# Patient Record
Sex: Female | Born: 1960
Health system: Southern US, Community
[De-identification: ages and names within clinical notes are randomized; demographics above are authoritative.]

## PROBLEM LIST (undated history)

## (undated) DIAGNOSIS — E785 Hyperlipidemia, unspecified: Secondary | ICD-10-CM

## (undated) DIAGNOSIS — R002 Palpitations: Secondary | ICD-10-CM

## (undated) DIAGNOSIS — K635 Polyp of colon: Secondary | ICD-10-CM

## (undated) DIAGNOSIS — M1990 Unspecified osteoarthritis, unspecified site: Secondary | ICD-10-CM

## (undated) DIAGNOSIS — I10 Essential (primary) hypertension: Secondary | ICD-10-CM

## (undated) DIAGNOSIS — E669 Obesity, unspecified: Secondary | ICD-10-CM

## (undated) HISTORY — PX: COLONOSCOPY: SHX174

## (undated) HISTORY — DX: Essential (primary) hypertension: I10

## (undated) HISTORY — DX: Hyperlipidemia, unspecified: E78.5

## (undated) HISTORY — DX: Palpitations: R00.2

## (undated) HISTORY — DX: Polyp of colon: K63.5

## (undated) HISTORY — PX: POLYPECTOMY: SHX149

## (undated) HISTORY — DX: Obesity, unspecified: E66.9

## (undated) HISTORY — PX: WISDOM TOOTH EXTRACTION: SHX21

## (undated) HISTORY — DX: Unspecified osteoarthritis, unspecified site: M19.90

---

## 1988-10-27 HISTORY — PX: TUBAL LIGATION: SHX77

## 2001-10-27 HISTORY — PX: BREAST BIOPSY: SHX20

## 2004-10-27 HISTORY — PX: VAGINAL HYSTERECTOMY: SUR661

## 2006-10-27 DIAGNOSIS — K635 Polyp of colon: Secondary | ICD-10-CM

## 2006-10-27 HISTORY — DX: Polyp of colon: K63.5

## 2010-10-27 DIAGNOSIS — I10 Essential (primary) hypertension: Secondary | ICD-10-CM

## 2010-10-27 DIAGNOSIS — E785 Hyperlipidemia, unspecified: Secondary | ICD-10-CM

## 2010-10-27 HISTORY — DX: Essential (primary) hypertension: I10

## 2010-10-27 HISTORY — DX: Hyperlipidemia, unspecified: E78.5

## 2011-05-09 ENCOUNTER — Ambulatory Visit: Payer: Self-pay | Admitting: Family Medicine

## 2011-06-17 ENCOUNTER — Encounter: Payer: Self-pay | Admitting: Family Medicine

## 2011-06-17 ENCOUNTER — Ambulatory Visit (INDEPENDENT_AMBULATORY_CARE_PROVIDER_SITE_OTHER): Payer: PRIVATE HEALTH INSURANCE | Admitting: Family Medicine

## 2011-06-17 VITALS — BP 130/86 | HR 75 | Temp 98.7°F | Resp 20 | Ht 64.0 in | Wt 240.0 lb

## 2011-06-17 DIAGNOSIS — Z0000 Encounter for general adult medical examination without abnormal findings: Secondary | ICD-10-CM

## 2011-06-17 DIAGNOSIS — D126 Benign neoplasm of colon, unspecified: Secondary | ICD-10-CM

## 2011-06-17 DIAGNOSIS — K625 Hemorrhage of anus and rectum: Secondary | ICD-10-CM

## 2011-06-17 DIAGNOSIS — E119 Type 2 diabetes mellitus without complications: Secondary | ICD-10-CM

## 2011-06-17 DIAGNOSIS — K635 Polyp of colon: Secondary | ICD-10-CM

## 2011-06-17 DIAGNOSIS — I10 Essential (primary) hypertension: Secondary | ICD-10-CM | POA: Insufficient documentation

## 2011-06-17 LAB — POCT URINALYSIS DIPSTICK
Glucose, UA: NEGATIVE
Leukocytes, UA: NEGATIVE
Nitrite, UA: NEGATIVE
Protein, UA: NEGATIVE
Urobilinogen, UA: 0.2

## 2011-06-17 LAB — BASIC METABOLIC PANEL
Chloride: 104 mEq/L (ref 96–112)
Creatinine, Ser: 0.5 mg/dL (ref 0.4–1.2)
GFR: 139.53 mL/min (ref >60.00)
Potassium: 3.6 mEq/L (ref 3.5–5.1)

## 2011-06-17 LAB — LIPID PANEL
Cholesterol: 186 mg/dL (ref 0–200)
HDL: 52.7 mg/dL (ref >39.00)
LDL Cholesterol: 120 mg/dL — ABNORMAL HIGH (ref 0–99)
VLDL: 13.6 mg/dL (ref 0.0–40.0)

## 2011-06-17 LAB — MICROALBUMIN / CREATININE URINE RATIO
Creatinine,U: 213.9 mg/dL
Microalb Creat Ratio: 0.3 mg/g (ref 0.0–30.0)

## 2011-06-17 LAB — CBC WITH DIFFERENTIAL/PLATELET
Basophils Absolute: 0 10*3/uL (ref 0.0–0.1)
Basophils Relative: 0.8 % (ref 0.0–3.0)
Eosinophils Absolute: 0.1 10*3/uL (ref 0.0–0.7)
Lymphocytes Relative: 43.5 % (ref 12.0–46.0)
MCHC: 32.1 g/dL (ref 30.0–36.0)
Monocytes Absolute: 0.4 10*3/uL (ref 0.1–1.0)
Neutrophils Relative %: 46.1 % (ref 43.0–77.0)
Platelets: 215 10*3/uL (ref 150.0–400.0)
RBC: 4.66 Mil/uL (ref 3.87–5.11)
RDW: 14.4 % (ref 11.5–14.6)

## 2011-06-17 LAB — TSH: TSH: 0.58 u[IU]/mL (ref 0.35–5.50)

## 2011-06-17 LAB — HEPATIC FUNCTION PANEL
AST: 17 U/L (ref 0–37)
Total Bilirubin: 0.4 mg/dL (ref 0.3–1.2)

## 2011-06-17 LAB — HEMOGLOBIN A1C: Hgb A1c MFr Bld: 8 % — ABNORMAL HIGH (ref 4.6–6.5)

## 2011-06-17 MED ORDER — BENAZEPRIL-HYDROCHLOROTHIAZIDE 10-12.5 MG PO TABS: 1.0000 | ORAL_TABLET | Freq: Every day | ORAL | Status: DC

## 2011-06-17 NOTE — Patient Instructions (Signed)
Rectal Bleeding Rectal bleeding is a sign that something is wrong. It is usually something minor that can be easily diagnosed (find the cause), but not always. It is important that the cause of rectal bleeding be identified so treatment can be started and the problem corrected. Rectal bleeding may not be serious, but you should not assume. Rectal bleeding can be a sign of cancer. CAUSES OF RECTAL BLEEDING   Hemorrhoids are dilated (enlarged) blood vessels or veins in the anal or rectal area. They can occur on the outside of the rectal area. You may feel them as small bumps when wiping. They may also be on the inside. Hemorrhoids on the inside of the rectal area are usually painless. Hemorrhoids often develop with chronic (longstanding) constipation, and during pregnancy. Hemorrhoids are usually treated with stool bulking agents. These soften the stool and reduce straining.   Fistulas are abnormal, burrowing channels that usually run from inside the rectum to the skin around the anus. They may drain a whitish discharge, but can also bleed. While it is usually a local problem, a fistula can be associated with chronic inflammation (soreness) in other parts of the intestinal tract (gut). An example of this is Crohn's disease. Fistulas are treated with antibiotics (medications that kill germs). They are also treated with hot baths. If they persist, surgery may be required.   A Fissure may occur with the passage of a hard stool or with severe diarrhea. The tissue lining the anus is torn. Nerve endings and blood vessels are exposed. Pain and bleeding occur with bowel movements. Frequent warm baths and bulking agents, used to keep stools soft, usually correct this problem. Sometimes surgery is needed.   Diverticulosis is a condition in which pockets, or sacs, project from the bowel wall. The sacs balloon out over the years due to recurrent, high-pressure spasms of the colon. Occasionally they can bleed.  They usually produce a lot of blood. The blood comes all at one time. The blood normally does not continue in small amounts with bowel movements over days or weeks. Serious, persistent diverticular bleeding usually requires hospitalization. It may also require surgery.   Proctitis and Colitis are conditions in which the rectum, colon, or both, can become inflamed (red and sore) and ulcerated (pitted). There are a number of disorders that cause the inside surface of the bowel to become ulcerated and bleed. There may be rectal urgency, cramps, or diarrhea associated with the bleeding. When the inflammation is restricted to the rectum, the condition is called proctitis. When the colon is involved, it is called colitis. It is important to identify the specific cause of the inflammation, so you can start treatment.   Polyps and Cancer. Polyps are benign (non-cancerous) growths in the colon that may bleed. Certain types of polyps turn into cancer. Colon cancer is often curable when discovered early. It most often occurs in people over the age of 78, but may occur in younger individuals. It can occur in those in their 39's or younger. Because colon cancer is such a common cancer, it is always considered a possible cause of bleeding.   Protrusion of the Rectum. Part of the rectum can project from the anus and bleed. This happens as older individuals develop weakened rectal support tissues. This condition is called rectal prolapse. It can be felt as an abnormal bulging from the rectum when wiping. Surgery is the only effective treatment.  DIAGNOSING RECTAL BLEEDING  The Medical History.  Age is  important. Older people tend to develop polyps and cancer more often. If there is anal pain and a hard, large stool associated with bleeding, a tear of the anus may be the cause. If blood drips into the toilet after a bowel movement, bleeding hemorrhoids may be the problem. The color and frequency of the bleeding are  additional considerations. In most cases, the medical history provides clues, but seldom the final answer.   The Visual and Digital Exam.  The caregiver will inspect the anal area to look for tears and hemorrhoids. A finger exam can provide information when there is tenderness or a tumor inside. In men, the prostate is also examined.   Endoscopy. There are several types of endoscopes (small, long viewing scopes) used to view the colon. In the office, the caregiver may use a rigid or, more commonly, a flexible viewing sigmoidoscope. This exam is called flexible sigmoidoscopy. It is performed in 5 to 10 minutes. A more thorough exam is accomplished with a colonoscope. This allows the caregiver to view the entire 5 to 6 foot long colon. Sedation (to make the patient sleep) is usually given for this exam. Frequently, a bleeding lesion may be beyond the reach of the sigmoidoscope. In these cases, a colonoscopy may be the best initial exam. Both exams are usually done on an outpatient basis. This means the patient does not stay overnight in the hospital or surgery center.   Barium Enema X-ray.  This is an x-ray exam that uses liquid barium inserted by enema into the rectum. X-rays highlight abnormal shadows, such as tumors (lumps), diverticuli and colitis. By itself, however, it may not identify an actual bleeding point.  Rectal bleeding always means that there is a problem. It is usually not a serious problem, but it should always be evaluated to make sure. The diagnosis is made easily, and effective treatment is almost always available. SEEK IMMEDIATE MEDICAL ATTENTION IF:  Bleeding is ongoing or severe.   You feel dizzy or faint.   You develop severe abdominal pain or tenderness.   An oral temperature above 102 F (38.9 C) develops, or as your caregiver suggests.  Document Released: 04/04/2002 Document Re-Released: 07/22/2008 [facility] Patient Information 2011 [place], [place].  Diabetes Meal  Planning Guide The diabetes meal planning guide is a tool to help you plan your meals and snacks. It is important for people with diabetes to manage their blood sugar levels. Choosing the right foods and the right amounts throughout your day will help control your blood sugar. Eating right can even help you improve your blood pressure and reach or maintain a healthy weight. CARBOHYDRATE COUNTING MADE EASY When you eat carbohydrates, they turn to sugar (glucose). This raises your blood sugar level. Counting carbohydrates can help you control this level so you feel better. When you plan your meals by counting carbohydrates, you can have more flexibility in what you eat and balance your medicine with your food intake. Carbohydrate counting simply means adding up the total amount of carbohydrate grams (g) in your meals or snacks. Try to eat about the same amount at each meal. Foods with carbohydrates are listed below. Each portion below is 1 carbohydrate serving or 15 grams of carbohydrates. Ask your dietician how many grams of carbohydrates you should eat at each meal or snack. Grains and Starches 1 slice bread 1/2 English muffin or hotdog/hamburger bun 3/4 cup cold cereal (unsweetened) 1/3 cup cooked pasta or rice 1/2 cup starchy vegetables (corn, potatoes, peas, beans, winter  squash) 1 tortilla (6 inches) 1/4 bagel 1 waffle or pancake (size of a CD) 1/2 cup cooked cereal 4 to 6 small crackers *Whole grain is recommended Fruit 1 cup fresh unsweetened berries, melon, papaya, pineapple 1 small fresh fruit 1/2 banana or mango 1/2 cup fruit juice (4 ounces unsweetened) 1/2 cup canned fruit in natural juice or water 2 tablespoons dried fruit 12 to 15 grapes or cherries Milk and Yogurt 1 cup fat-free or 1% milk 1 cup soy milk 6 ounces light yogurt with sugar-free sweetener 6 ounces low-fat soy yogurt 6 ounces plain yogurt Vegetables 1 cup raw or 1/2 cup cooked is counted as 0 carbohydrates or  a "free" food. If you eat 3 or more servings at one meal, count them as 1 carbohydrate serving. Other Carbohydrates 3/4 ounces chips or pretzels 1/2 cup ice cream or frozen yogurt 1/4 cup sherbet or sorbet 2 inch square cake, no frosting 1 tablespoon honey, sugar, jam, jelly, or syrup 2 small cookies 3 squares of graham crackers 3 cups popcorn 6 crackers 1 cup broth-based soup Count 1 cup casserole or other mixed foods as 2 carbohydrate servings. Foods with less than 20 calories in a serving may be counted as 0 carbohydrates or a "free" food. You may want to purchase a book or computer software that lists the carbohydrate gram counts of different foods. In addition, the nutrition facts panel on the labels of the foods you eat are a good source of this information. The label will tell you how big the serving size is and the total number of carbohydrate grams you will be eating per serving. Divide this number by 15 to obtain the number of carbohydrate servings in a portion. Remember: 1 carbohydrate serving equals 15 grams of carbohydrate. SERVING SIZES Measuring foods and serving sizes helps you make sure you are getting the right amount of food. The list below tells how big or small some common serving sizes are.  1 ounce (oz) of cheese.................................4 stacked dice.   2 to 3 oz cooked meat.................................Marland KitchenDeck of cards.   1 teaspoon (tsp)...........................................Marland KitchenTip of little finger.   1 tablespoon (tbs).......................................Marland KitchenMarland KitchenThumb.   2 tbs............................................................Marland KitchenGolf ball.    cup..........................................................Marland KitchenHalf of a fist.   1 cup...........................................................Marland KitchenA fist.  SAMPLE DIABETES MEAL PLAN Below is a sample meal plan that includes foods from the grain and starches, dairy, vegetable, fruit, and meat groups. A  dietician can individualize a meal plan to fit your calorie needs and tell you the number of servings needed from each food group. However, controlling the total amount of carbohydrates in your meal or snack is more important than making sure you include all of the food groups at every meal. You may interchange carbohydrate containing foods (dairy, starches, and fruits). The meal plan below is an example of a 2000 calorie diet using carbohydrate counting. This meal plan has 17 carbohydrate servings (carb choices). Breakfast 1 cup oatmeal (2 carb choices) 3/4 cup light yogurt (1 carb choice) 1 cup blueberries (1 carb choice) 1/4 cup almonds  Snack 1 large apple (2 carb choices) 1 low-fat string cheese stick  Lunch Chicken breast salad:  1 cup spinach   1/4 cup chopped tomatoes   2 oz chicken breast, sliced   2 tbs low-fat [place] dressing  12 whole-wheat crackers (2 carb choices) 12 to 15 grapes (1 carb choice) 1 cup low-fat milk (1 carb choice)  Snack 1 cup carrots 1/2 cup hummus (1 carb choice)  Dinner 3 oz broiled salmon 1 cup  brown rice (3 carb choices)  Snack 1 1/2 cups steamed broccoli (1 carb choice) drizzled with 1 tsp olive oil and lemon juice 1 cup light pudding (2 carb choices)  DIABETES MEAL PLANNING WORKSHEET Your dietician can use this worksheet to help you decide how many servings of foods and what types of foods are right for you.  Breakfast Food Group and Servings Carb Choices Grain/Starches _______________________________________ Dairy ______________________________________________ Vegetable _______________________________________ Fruit _______________________________________________ Meat _______________________________________________ Fat _____________________________________________ Lunch Food Group and Servings Carb Choices Grain/Starches ________________________________________ Dairy _______________________________________________ Fruit  ________________________________________________ Meat ________________________________________________ Fat _____________________________________________ Dinner Food Group and Servings Carb Choices Grain/Starches ________________________________________ Dairy _______________________________________________ Fruit ________________________________________________ Meat ________________________________________________ Fat _____________________________________________ Snacks Food Group and Servings Carb Choices Grain/Starches ________________________________________ Dairy _______________________________________________ Vegetable ________________________________________ Fruit ________________________________________________ Meat ________________________________________________ Fat _____________________________________________ Daily Totals Starches _________________________ Vegetable __________________________ Fruit ______________________________ Dairy ______________________________ Meat ______________________________ Fat ________________________________  Document Released: 07/10/2005 Document Re-Released: 04/02/2010 [employer] Patient Information 2011 [place], [place].

## 2011-06-17 NOTE — Progress Notes (Signed)
  Subjective:    Patient ID: Angela Barr, female    DOB: 08/15/1961, 50 y.o.   MRN: [id number]  HPI Pt here to establish and c/o bleeding per rectum x 2 weeks.  Pt has hx of hemorrhoids and colon polyps and was supposed to have a colonoscopy last year but did not.  She just moved here from [place].  Pt states her stools are not hard and she does not strain.  Pt has also not been taking any of her meds  But states her BS have been running low---in 70s.  Review of Systems as above   Objective:   Physical Exam  Constitutional: She is oriented to person, place, and time. She appears well-developed and well-nourished.  Cardiovascular: Normal rate, regular rhythm and normal heart sounds.   No murmur heard. Pulmonary/Chest: Effort normal and breath sounds normal. No respiratory distress. She has no wheezes. She has no rales. She exhibits no tenderness.  Genitourinary: Rectal exam shows external hemorrhoid. Rectal exam shows no fissure, no mass, no tenderness and anal tone normal. Guaiac positive stool.  Musculoskeletal: She exhibits no edema and no tenderness.  Neurological: She is alert and oriented to person, place, and time.  Skin: Skin is warm and dry.  Psychiatric: She has a normal mood and affect. Her behavior is normal.          Assessment & Plan:  1. + rectal bleed with hx colon polyps and hemorrhoids      Pt is already eating more fiber       Refer to GI          Check labs today 2 hx diabetes---check labs today 3. HTN  meds refilled rto for cpe or sooner prn

## 2011-06-18 ENCOUNTER — Encounter: Payer: Self-pay | Admitting: Gastroenterology

## 2011-06-24 ENCOUNTER — Telehealth: Payer: Self-pay

## 2011-06-24 MED ORDER — ATORVASTATIN CALCIUM 20 MG PO TABS: 20.0000 mg | ORAL_TABLET | Freq: Every day | ORAL | Status: DC

## 2011-06-24 MED ORDER — METFORMIN HCL ER 500 MG PO TB24: 500.0000 mg | ORAL_TABLET | Freq: Every day | ORAL | Status: DC

## 2011-06-24 NOTE — Telephone Encounter (Signed)
Notes Recorded by Almeta Monas, CMA on 06/24/2011 at 3:10 PM Discuss with patient and she stated she had not been taking the metformin as directed, she stopped it 4 mos agao. She stated she will start it back with 1 instead of doing 2 daily. I discussed with Dr.Lowne and she stated that was ok. Rx sent to [facility] KP Notes Recorded by Almeta Monas, CMA on 06/24/2011 at 2:35 PM mssg left to call the office KP Notes Recorded by Lelon Perla, DO on 06/22/2011 at 3:26 PM Cholesterol--- LDL goal < 70, HDL >40, TG < 150. Diet and exercise will increase HDL and decrease LDL and TG. Fish, Fish Oil, Flaxseed oil will also help increase the HDL and decrease Triglycerides. Start lipitor 20 mg #30 1 po qhs, 2 refills.   hgba1c is elevated---- increase Glucophage xr 500 mg 2 po qd #60 2 refills Recheck labs in 3 months. 250.00 272.4 Lipid, hep, bmp, hgba1c, microalbumin

## 2011-07-04 ENCOUNTER — Ambulatory Visit (INDEPENDENT_AMBULATORY_CARE_PROVIDER_SITE_OTHER): Payer: PRIVATE HEALTH INSURANCE | Admitting: Gastroenterology

## 2011-07-04 ENCOUNTER — Encounter: Payer: Self-pay | Admitting: Gastroenterology

## 2011-07-04 DIAGNOSIS — K625 Hemorrhage of anus and rectum: Secondary | ICD-10-CM | POA: Insufficient documentation

## 2011-07-04 DIAGNOSIS — Z860100 Personal history of colon polyps, unspecified: Secondary | ICD-10-CM | POA: Insufficient documentation

## 2011-07-04 DIAGNOSIS — Z800 Family history of malignant neoplasm of digestive organs: Secondary | ICD-10-CM | POA: Insufficient documentation

## 2011-07-04 MED ORDER — HYDROCORTISONE ACETATE 25 MG RE SUPP: 25.0000 mg | Freq: Two times a day (BID) | RECTAL | Status: AC

## 2011-07-04 NOTE — Assessment & Plan Note (Signed)
Limited rectal bleeding is most likely related to hemorrhoids.  Recommendations #1 Anusol-HC suppositories

## 2011-07-04 NOTE — Patient Instructions (Signed)
Colonoscopy A colonoscopy is an exam to evaluate your entire colon. In this exam, your colon is cleansed. A long fiberoptic tube is inserted through your rectum and into your colon. The fiberoptic scope (endoscope) is a long bundle of enclosed and very flexible fibers. These fibers transmit light to the area examined and send images from that area to your caregiver. Discomfort is usually minimal. You may be given a drug to help you sleep (sedative) during or prior to the procedure. This exam helps to detect lumps (tumors), polyps, inflammation, and areas of bleeding. Your caregiver may also take a small piece of tissue (biopsy) that will be examined under a microscope. BEFORE THE PROCEDURE  A clear liquid diet may be required for 2 days before the exam.   Liquid injections (enemas) or laxatives may be required.   A large amount of electrolyte solution may be given to you to drink over a short period of time. This solution is used to clean out your colon.   You should be present 1 prior to your procedure or as directed by your caregiver.   Check in at the admissions desk to fill out necessary forms if not preregistered. There will be consent forms to sign prior to the procedure. If accompanied by friends or family, there is a waiting area for them while you are having your procedure.  LET YOUR CAREGIVER KNOW ABOUT:  Allergies to food or medicine.  Medicines taken, including vitamins, herbs, eyedrops, over-the-counter medicines, and creams.   Use of steroids (by mouth or creams).   Previous problems with anesthetics or numbing medicines.   History of bleeding problems or blood clots.  Previous surgery.   Other health problems, including diabetes and kidney problems.   Possibility of pregnancy, if this applies.   AFTER THE PROCEDURE  If you received a sedative and/or pain medicine, you will need to arrange for someone to drive you home.   Occasionally, there is a little blood passed  with the first bowel movement. DO NOT be concerned.  HOME CARE INSTRUCTIONS  It is not unusual to pass moderate amounts of gas and experience mild abdominal cramping following the procedure. This is due to air being used to inflate your colon during the exam. Walking or a warm pack on your belly (abdomen) may help.   You may resume all normal meals and activities after sedatives and medicines have worn off.   Only take over-the-counter or prescription medicines for pain, discomfort, or fever as directed by your caregiver. DO NOT use aspirin or blood thinners if a biopsy was taken. Consult your caregiver for medicine usage if biopsies were taken.  FINDING OUT THE RESULTS OF YOUR TEST Not all test results are available during your visit. If your test results are not back during the visit, make an appointment with your caregiver to find out the results. Do not assume everything is normal if you have not heard from your caregiver or the medical facility. It is important for you to follow up on all of your test results. SEEK IMMEDIATE MEDICAL CARE IF:  You pass large blood clots or fill a toilet with blood following the procedure. This may also occur 10 to 14 days following the procedure. This is more likely if a biopsy was taken.   You develop abdominal pain that keeps getting worse and cannot be relieved with medicine.  Document Released: 10/10/2000 Document Re-Released: 01/07/2010 [employer] Patient Information 2011 [employer]. 

## 2011-07-04 NOTE — Assessment & Plan Note (Signed)
Plan followup colonoscopy  Risks, alternatives, and complications of the procedure, including bleeding, perforation, and possible need for surgery, were explained to the patient.  Patient's questions were answered.  

## 2011-07-04 NOTE — Assessment & Plan Note (Signed)
Plan colonoscopy every 5 years 

## 2011-07-04 NOTE — Progress Notes (Signed)
History of Present Illness: Angela Barr is a pleasant 50 year old white female referred at the request of Dr. Laury Axon  for evaluation of rectal bleeding.  Over the past month she's been experiencing small amounts of rectal bleeding consisting of bright red blood on  the tissue after a bowel movement. She's had some difficulty cleaning herself  and rectal burning and itching. Family history is pertinent for a father with colon cancer in his early 68s. Multiple colon polyps apparently were removed in 2008.  Recent hemoglobin 12.4 and MCV 83.1.  Past medical history is pertinent for non-insulin-dependent diabetes mellitus.    Review of Systems: Pertinent positive and negative review of systems were noted in the above HPI section. All other review of systems were otherwise negative.    Current Medications, Allergies, Past Medical History, Past Surgical History, Family History and Social History were reviewed in [facility] electronic medical record  Vital signs were reviewed in today's medical record. Physical Exam: General: Well developed , well nourished, no acute distress Head: Normocephalic and atraumatic Eyes:  sclerae anicteric, EOMI Ears: Normal auditory acuity Mouth: No deformity or lesions Lungs: Clear throughout to auscultation Heart: Regular rate and rhythm; no murmurs, rubs or bruits Abdomen: Soft, non tender and non distended. No masses, hepatosplenomegaly or hernias noted. Normal Bowel sounds Rectal: Small external hemorrhoids are present Musculoskeletal: Symmetrical with no gross deformities  Pulses:  Normal pulses noted Extremities: No clubbing, cyanosis, edema or deformities noted Neurological: Alert oriented x 4, grossly nonfocal Psychological:  Alert and cooperative. Normal mood and affect

## 2011-07-07 ENCOUNTER — Encounter: Payer: Self-pay | Admitting: Gastroenterology

## 2011-07-07 ENCOUNTER — Ambulatory Visit (AMBULATORY_SURGERY_CENTER): Payer: PRIVATE HEALTH INSURANCE | Admitting: Gastroenterology

## 2011-07-07 DIAGNOSIS — K625 Hemorrhage of anus and rectum: Secondary | ICD-10-CM

## 2011-07-07 DIAGNOSIS — Z86010 Personal history of colonic polyps: Secondary | ICD-10-CM

## 2011-07-07 DIAGNOSIS — Z1211 Encounter for screening for malignant neoplasm of colon: Secondary | ICD-10-CM

## 2011-07-07 DIAGNOSIS — D126 Benign neoplasm of colon, unspecified: Secondary | ICD-10-CM

## 2011-07-07 DIAGNOSIS — Z800 Family history of malignant neoplasm of digestive organs: Secondary | ICD-10-CM

## 2011-07-07 HISTORY — PX: COLONOSCOPY: SHX174

## 2011-07-07 LAB — GLUCOSE, CAPILLARY

## 2011-07-07 MED ORDER — SODIUM CHLORIDE 0.9 % IV SOLN: 500.0000 mL | INTRAVENOUS | Status: DC

## 2011-07-07 NOTE — Progress Notes (Signed)
No complaints in the recovery room or on discharge. MAW

## 2011-07-07 NOTE — Patient Instructions (Signed)
See the picture page for your findings from your exam today.  Follow the green and blue discharge instruction sheets the rest of the day.  Resume your prior medications today. Please call if any questions or concerns.  

## 2011-07-08 ENCOUNTER — Telehealth: Payer: Self-pay | Admitting: *Deleted

## 2011-07-08 NOTE — Telephone Encounter (Signed)
Left message on cell

## 2011-11-13 ENCOUNTER — Encounter: Payer: Self-pay | Admitting: Family Medicine

## 2011-11-13 ENCOUNTER — Ambulatory Visit (INDEPENDENT_AMBULATORY_CARE_PROVIDER_SITE_OTHER): Payer: PRIVATE HEALTH INSURANCE | Admitting: Family Medicine

## 2011-11-13 VITALS — BP 132/76 | HR 94 | Temp 98.1°F | Wt 242.0 lb

## 2011-11-13 DIAGNOSIS — N800 Endometriosis of uterus: Secondary | ICD-10-CM

## 2011-11-13 DIAGNOSIS — IMO0002 Reserved for concepts with insufficient information to code with codable children: Secondary | ICD-10-CM

## 2011-11-13 DIAGNOSIS — S76219A Strain of adductor muscle, fascia and tendon of unspecified thigh, initial encounter: Secondary | ICD-10-CM

## 2011-11-13 DIAGNOSIS — N8111 Cystocele, midline: Secondary | ICD-10-CM

## 2011-11-13 MED ORDER — TRAMADOL HCL 50 MG PO TABS: 50.0000 mg | ORAL_TABLET | Freq: Three times a day (TID) | ORAL | Status: AC | PRN

## 2011-11-13 NOTE — Patient Instructions (Signed)
Cystocele Repair A cystocele is a bulging, drooping hernia or break (rupture) of bladder tissue into the birth canal (vagina). This bulging or rupture occurs on the top front wall of the vagina. CAUSES  Cystocele is associated with weakness of the top front wall of the vagina due to stretching and tearing of the ligaments and muscles in the area. This is often the result of:  Multiple childbirths.   Continuous heavy lifting.   Chronic cough from asthma, emphysema, or smoking.   Being overweight.   Changes from aging.   Previous surgery in the vaginal area.   Menopause with loss of estrogen hormone and weakening of the ligaments and muscles around the bladder.  SYMPTOMS   Uncontrolled loss of urine (incontinence) with cough, sneeze, or exercise.   Pelvic pressure.   Frequency or urgency to urinate because of inability to completely empty the bladder.   Bladder infections.   Needing to push on the upper vagina to help yourself pass urine.  DIAGNOSIS  A cystocele can be diagnosed by doing a pelvic exam and observing the top of the vagina drooping or bulging into or out of the vagina. TREATMENT  Surgical options:  Cystocele repair is surgery that removes the hernia.   There are also different "sling" operations that may be used.  Discuss the different types of surgeries to repair a cystocele with your caregiver. Your caregiver will decide what type of surgery will be best in your case. Nonsurgical options:  Kegel exercises. This helps strengthen and tighten the muscles and tissue in and around the bladder and vagina. This may help with mild cases of cystocele.   A pessary may help the cystocele. A pessary is a plastic or rubber device that lifts the bladder into place. A pessary must be fitted by a doctor.   Tampons or diaphragms that lift the bladder into place are sometimes helpful with a minor or small cystocele.   Estrogen may help with mild cases in menopausal and aging  women.  LET YOUR CAREGIVER KNOW ABOUT:   Allergies to food or medicine.   Medicines taken, including vitamins, herbs, eyedrops, over-the-counter medicines, and creams.   Use of steroids (by mouth or creams).   Previous problems with anesthetics or numbing medicines.   History of bleeding problems or blood clots.   Previous surgery.   Other health problems, including diabetes and kidney problems.   Possibility of pregnancy, if this applies.  RISKS AND COMPLICATIONS  All surgery is associated with risks.  There are risks with a general anesthesia. You should discuss this with your caregiver.   With spinal or epidural anesthesia, there may be an area that is not numbed, and you could feel pain.   Headache could occur with a spinal or epidural anesthetic.   The catheter you will have after surgery may not work properly or may get blocked and need to be replaced.   Excessive bleeding.   Infection.   Injury to surrounding structures.   Recurrence of the cystocele.   Surgery may not get rid of your symptoms.  BEFORE THE PROCEDURE   Do not take aspirin or blood thinners for 1 week prior to surgery, unless instructed otherwise.   Do not eat or drink anything after midnight the night before surgery.   Let your caregiver know if you develop a cold or other infectious problems prior to surgery.   If being admitted the day of surgery, you should be present 1 hour prior to your   procedure or as directed by your caregiver.   Plan and arrange for help when you go home from the hospital.   If you smoke, do not smoke for at least 2 weeks before the surgery.   Do not drink any alcohol for 3 days before the surgery.  PROCEDURE  You will be given an anesthetic to prevent you from feeling pain during surgery. This may be a general anesthetic that puts you to sleep, or a spinal or epidural anesthetic. You will be asleep or be numbed through the entire procedure. During cystocele repair,  tissue is pulled from the sides and around the top of the vagina to lift up the hernia. This removes the hernia so that the top of the vagina does not fall into the opening of the vagina. AFTER THE PROCEDURE  After surgery, you will be taken to the recovery room where a nurse will take care of you, checking your breathing, blood pressure, pulse, and your progress. When your caregiver feels you are stable, you will be taken to your room. You will have a drainage tube (Foley catheter) that will drain your bladder for 2 to 7 days or longer, until your bladder is working properly. This catheter is placed prior to surgery to help keep your bladder empty and out of the way during the procedure. After surgery, this will make passing your urine easier. The catheter will be removed when you can easily pass urine without this assistance. You may have gauze packing in the vagina that will be removed 1 to 2 days after the surgery. Usually, you will be given a medicine (antibiotic) that kills germs. You will be given pain medicine as needed. You can usually go home in 3 to 5 days. HOME CARE INSTRUCTIONS   Do not take baths. Take showers until your caregiver informs you otherwise.   Take antibiotics as directed by your caregiver.   Exercise as instructed. Do not perform exercises which increase the pressure inside your belly (abdomen), such as sit-ups or lifting weights, until your caregiver has given permission. Walking exercise is preferred.   Only take over-the-counter or prescription medicines for pain and discomfort as directed by your caregiver.   Do not drink alcohol while taking pain medicine.   Do not lift anything over 5 pounds.   Do not drive until your caregiver gives you permission.   Get plenty of rest and sleep.   Have someone help with your household chores for 1 to 2 weeks.   If you develop constipation, you may take a mild laxative with your caregiver's permission. Eating bran foods and  drinking enough water and fluids to keep your urine clear or pale yellow helps with constipation.   Do not take aspirin. It may cause bleeding.   You may resume normal diet and unstrenuous activities as directed.   Do not douche, use tampons, or engage in intercourse until your surgeon has given permission.   Change bandages (dressings) as directed.   Make and keep all your postoperative appointments.  SEEK MEDICAL CARE IF:   You have abnormal vaginal discharge.   You develop a rash.   You are having a reaction to your medicine.   You develop nausea or vomiting.  SEEK IMMEDIATE MEDICAL CARE IF:   You have redness, swelling, or increasing pain in the vaginal area.   You notice pus coming from the vagina.   You have a fever.   You notice a bad smell coming from the vagina.     You have increasing abdominal pain.   You have frequent urination or you notice burning during urination.   You notice blood in your urine.   You have excessive vaginal bleeding.   You cannot urinate.  MAKE SURE YOU:   Understand these instructions.   Will watch your condition.   Will get help right away if you are not doing well or get worse.  Document Released: 10/10/2000 Document Revised: 06/25/2011 Document Reviewed: 01/10/2010 [employer] Patient Information 2012 [employer]. 

## 2011-11-13 NOTE — Progress Notes (Signed)
  Subjective:    Patient ID: Angela Barr, female    DOB: [birth year], 51 y.o.   MRN: [id number]  HPI  Pt here c/o R groin pain for 1 1/2 years and she said her gyn in [place] said her bladder had dropped.  Pt with hx fractured foot after fall and pt ended up in a split position after fall--groin hurt since then.    Review of Systems As above    Objective:   Physical Exam  Constitutional: She is oriented to person, place, and time. She appears well-developed and well-nourished.  Genitourinary:       No hernia + tenderness with palp R groin  Neurological: She is alert and oriented to person, place, and time.  Skin: Skin is warm and dry.  Psychiatric: She has a normal mood and affect. Her behavior is normal. Judgment and thought content normal.          Assessment & Plan:  Hx cystocele---  Refer to urology Hx adenomyoisi--s/p tah---refer to gyn here at pt request Groin pain-- ? If increase in pain secondary to cystocele?--- consider referral if no better after seeing urology

## 2011-12-10 ENCOUNTER — Telehealth: Payer: Self-pay | Admitting: Family Medicine

## 2011-12-10 ENCOUNTER — Other Ambulatory Visit: Payer: Self-pay | Admitting: Family Medicine

## 2011-12-10 DIAGNOSIS — I10 Essential (primary) hypertension: Secondary | ICD-10-CM

## 2011-12-10 MED ORDER — BENAZEPRIL-HYDROCHLOROTHIAZIDE 10-12.5 MG PO TABS: 1.0000 | ORAL_TABLET | Freq: Every day | ORAL | Status: DC

## 2011-12-10 NOTE — Telephone Encounter (Signed)
Rx faxed.    KP 

## 2011-12-10 NOTE — Telephone Encounter (Signed)
Patient called the office in regards to her prescription, Benazepril-Hydrochlorothiazide. Although the refill was called in today, the pharmacy has it on hold due to insurance requirements. The insurance will not allow a 30 day rx they want it to be change to 90 day. Patient can be best reached at [phone].

## 2011-12-10 NOTE — Telephone Encounter (Signed)
Rx re-faxed    KP 

## 2012-01-23 ENCOUNTER — Other Ambulatory Visit: Payer: Self-pay | Admitting: Family Medicine

## 2012-01-26 ENCOUNTER — Other Ambulatory Visit: Payer: Self-pay | Admitting: *Deleted

## 2012-01-26 MED ORDER — METFORMIN HCL ER 500 MG PO TB24: 500.0000 mg | ORAL_TABLET | Freq: Every day | ORAL | Status: DC

## 2012-01-26 NOTE — Telephone Encounter (Signed)
Pt called to advise that her metformin has not been called in to [facility] on [address], was advised no more refills, noted 30 tablets of metformin was sent on 01-23-12, pt asked about lab OV and CPE, advised that I could resend the metformin refill into pharmacy per noted [facility] computer malfunctions, transferred pt to scheduler for assistance with setting up lab/CPE OV, pt understood, re-sent metformin via escribe per noted lab visit for A1c on 01-30-12

## 2012-01-30 ENCOUNTER — Other Ambulatory Visit (INDEPENDENT_AMBULATORY_CARE_PROVIDER_SITE_OTHER): Payer: Self-pay

## 2012-01-30 DIAGNOSIS — E119 Type 2 diabetes mellitus without complications: Secondary | ICD-10-CM

## 2012-01-30 DIAGNOSIS — E785 Hyperlipidemia, unspecified: Secondary | ICD-10-CM

## 2012-01-30 LAB — HEPATIC FUNCTION PANEL
AST: 19 U/L (ref 0–37)
Alkaline Phosphatase: 64 U/L (ref 39–117)
Total Bilirubin: 0.4 mg/dL (ref 0.3–1.2)

## 2012-01-30 LAB — BASIC METABOLIC PANEL
CO2: 25 mEq/L (ref 19–32)
Calcium: 9.1 mg/dL (ref 8.4–10.5)
Chloride: 101 mEq/L (ref 96–112)
Glucose, Bld: 136 mg/dL — ABNORMAL HIGH (ref 70–99)
Sodium: 136 mEq/L (ref 135–145)

## 2012-01-30 LAB — MICROALBUMIN / CREATININE URINE RATIO
Creatinine,U: 133.1 mg/dL
Microalb, Ur: 0.6 mg/dL (ref 0.0–1.9)

## 2012-01-30 LAB — LIPID PANEL
LDL Cholesterol: 120 mg/dL — ABNORMAL HIGH (ref 0–99)
Total CHOL/HDL Ratio: 4

## 2012-02-17 MED ORDER — METFORMIN HCL ER 500 MG PO TB24: 1000.0000 mg | ORAL_TABLET | Freq: Every day | ORAL | Status: DC

## 2012-02-17 MED ORDER — ATORVASTATIN CALCIUM 40 MG PO TABS: 40.0000 mg | ORAL_TABLET | Freq: Every day | ORAL | Status: DC

## 2012-02-26 ENCOUNTER — Ambulatory Visit: Payer: PRIVATE HEALTH INSURANCE | Admitting: Family Medicine

## 2012-02-26 DIAGNOSIS — Z0289 Encounter for other administrative examinations: Secondary | ICD-10-CM

## 2012-04-05 ENCOUNTER — Other Ambulatory Visit: Payer: Self-pay | Admitting: Family Medicine

## 2012-04-05 DIAGNOSIS — I10 Essential (primary) hypertension: Secondary | ICD-10-CM

## 2012-04-05 MED ORDER — BENAZEPRIL-HYDROCHLOROTHIAZIDE 10-12.5 MG PO TABS: 1.0000 | ORAL_TABLET | Freq: Every day | ORAL | Status: DC

## 2012-04-05 NOTE — Telephone Encounter (Signed)
Refill for Benazepril - HCTZ 10-12.5MG  TAB Qty 90  Take one tablet by mouth daily  Last fill 2.13.13 Last OV 1.17.13

## 2012-04-21 ENCOUNTER — Encounter (HOSPITAL_BASED_OUTPATIENT_CLINIC_OR_DEPARTMENT_OTHER): Payer: Self-pay | Admitting: *Deleted

## 2012-04-21 ENCOUNTER — Emergency Department (HOSPITAL_BASED_OUTPATIENT_CLINIC_OR_DEPARTMENT_OTHER)
Admission: EM | Admit: 2012-04-21 | Discharge: 2012-04-21 | Disposition: A | Discharge status: Home / Self Care | Payer: 59 | Attending: Emergency Medicine | Admitting: Emergency Medicine

## 2012-04-21 DIAGNOSIS — Z881 Allergy status to other antibiotic agents status: Secondary | ICD-10-CM | POA: Insufficient documentation

## 2012-04-21 DIAGNOSIS — R300 Dysuria: Secondary | ICD-10-CM | POA: Insufficient documentation

## 2012-04-21 DIAGNOSIS — Z90710 Acquired absence of both cervix and uterus: Secondary | ICD-10-CM | POA: Insufficient documentation

## 2012-04-21 DIAGNOSIS — Z803 Family history of malignant neoplasm of breast: Secondary | ICD-10-CM | POA: Insufficient documentation

## 2012-04-21 DIAGNOSIS — Z833 Family history of diabetes mellitus: Secondary | ICD-10-CM | POA: Insufficient documentation

## 2012-04-21 DIAGNOSIS — I10 Essential (primary) hypertension: Secondary | ICD-10-CM | POA: Insufficient documentation

## 2012-04-21 DIAGNOSIS — Z800 Family history of malignant neoplasm of digestive organs: Secondary | ICD-10-CM | POA: Insufficient documentation

## 2012-04-21 DIAGNOSIS — Z860100 Personal history of colon polyps, unspecified: Secondary | ICD-10-CM | POA: Insufficient documentation

## 2012-04-21 DIAGNOSIS — E785 Hyperlipidemia, unspecified: Secondary | ICD-10-CM | POA: Insufficient documentation

## 2012-04-21 DIAGNOSIS — R350 Frequency of micturition: Secondary | ICD-10-CM | POA: Insufficient documentation

## 2012-04-21 DIAGNOSIS — N390 Urinary tract infection, site not specified: Secondary | ICD-10-CM | POA: Insufficient documentation

## 2012-04-21 DIAGNOSIS — Z8249 Family history of ischemic heart disease and other diseases of the circulatory system: Secondary | ICD-10-CM | POA: Insufficient documentation

## 2012-04-21 DIAGNOSIS — E669 Obesity, unspecified: Secondary | ICD-10-CM | POA: Insufficient documentation

## 2012-04-21 DIAGNOSIS — E119 Type 2 diabetes mellitus without complications: Secondary | ICD-10-CM | POA: Insufficient documentation

## 2012-04-21 LAB — URINALYSIS, ROUTINE W REFLEX MICROSCOPIC
Bilirubin Urine: NEGATIVE
Ketones, ur: 15 mg/dL — AB
Nitrite: POSITIVE — AB
Urobilinogen, UA: 1 mg/dL (ref 0.0–1.0)

## 2012-04-21 MED ORDER — NITROFURANTOIN MONOHYD MACRO 100 MG PO CAPS: 100.0000 mg | ORAL_CAPSULE | Freq: Two times a day (BID) | ORAL | Status: AC

## 2012-04-21 MED ORDER — NITROFURANTOIN MONOHYD MACRO 100 MG PO CAPS
100.0000 mg | ORAL_CAPSULE | Freq: Once | ORAL | Status: AC
  Administered 2012-04-21: 100 mg via ORAL

## 2012-04-21 MED ORDER — NITROFURANTOIN MONOHYD MACRO 100 MG PO CAPS
ORAL_CAPSULE | ORAL | Status: AC
  Filled 2012-04-21: qty 1

## 2012-04-21 MED ORDER — PHENAZOPYRIDINE HCL 200 MG PO TABS: 200.0000 mg | ORAL_TABLET | Freq: Three times a day (TID) | ORAL | Status: AC | PRN

## 2012-04-21 NOTE — Discharge Instructions (Signed)
Urinary Tract Infection in Pregnancy A urinary tract infection (UTI) is a bacterial infection of the urinary tract. Infection of the urinary tract can include the ureters, kidneys (pyelonephritis), bladder (cystitis), and urethra (urethritis). All pregnant women should be screened for bacteria in the urinary tract. Identifying and treating a UTI will decrease the risk of preterm labor and developing more serious infections in both the mother and baby. CAUSES Bacteria germs cause almost all UTIs. There are many factors that can increase your chances of getting a UTI during pregnancy. These include:  Having a short urethra.   Poor toilet and hygiene habits.   Sexual intercourse.   Blockage of urine along the urinary tract.   Problems with the pelvic muscles or nerves.   Diabetes.   Obesity.   Bladder problems after having several children.   Previous history of UTI.  SYMPTOMS   Pain, burning, or a stinging feeling when urinating.   Suddenly feeling the need to urinate right away (urgency).   Loss of bladder control (urinary incontinence).   Frequent urination, more than is common with pregnancy.   Lower abdominal or back discomfort.   Bad smelling urine.   Cloudy urine.   Blood in the urine (hematuria).   Fever.  When the kidneys are infected, the symptoms may be:  Back pain.   Flank pain on the right side more so than the left.   Fever.   Chills.   Nausea.   Vomiting.  DIAGNOSIS   Urine tests.   Additional tests and procedures may include:   Ultrasound of the kidneys, ureters, bladder, and urethra.   Looking in the bladder with a lighted tube (cystoscopy).   Certain X-ray studies only when absolutely necessary.  Finding out the results of your test Ask when your test results will be ready. Make sure you get your test results. TREATMENT  Antibiotic medicine by mouth.   Antibiotics given through the vein (intravenously), if needed.  HOME CARE  INSTRUCTIONS   Take your antibiotics as directed. Finish them even if you start to feel better. Only take medicine as directed by your caregiver.   Drink enough fluids to keep your urine clear or pale yellow.   Do not have sexual intercourse until the infection is gone and your caregiver says it is okay.   Make sure you are tested for UTIs throughout your pregnancy if you get one. These infections often come back.  Preventing a UTI in the future:  Practice good toilet habits. Always wipe from front to back. Use the tissue only once.   Do not hold your urine. Empty your bladder as soon as possible when the urge comes.   Do not douche or use deodorant sprays.   Wash with soap and warm water around the genital area and the anus.   Empty your bladder before and after sexual intercourse.   Wear underwear with a cotton crotch.   Avoid caffeine and carbonated drinks. They can irritate the bladder.   Drink cranberry juice or take cranberry pills. This may decrease the risk of getting a UTI.   Do not drink alcohol.   Keep all your appointments and tests as scheduled.  SEEK MEDICAL CARE IF:   Your symptoms get worse.   You are still having fevers 2 or more days after treatment begins.   You develop a rash.   You feel that you are having problems with medicines prescribed.   You develop abnormal vaginal discharge.  SEEK IMMEDIATE MEDICAL   CARE IF:   You develop back or flank pain.   You develop chills.   You have blood in your urine.   You develop nausea and vomiting.   You develop contractions of your uterus.   You have a gush of fluid from the vagina.  MAKE SURE YOU:   Understand these instructions.   Will watch your condition.   Will get help right away if you are not doing well or get worse.  Document Released: 02/07/2011 Document Revised: 10/02/2011 Document Reviewed: 02/07/2011 [facility] Patient Information 2012 [place], [place].Urinary Tract  Infection Infections of the urinary tract can start in several places. A bladder infection (cystitis), a kidney infection (pyelonephritis), and a prostate infection (prostatitis) are different types of urinary tract infections (UTIs). They usually get better if treated with medicines (antibiotics) that kill germs. Take all the medicine until it is gone. You or your child may feel better in a few days, but TAKE ALL MEDICINE or the infection may not respond and may become more difficult to treat. HOME CARE INSTRUCTIONS   Drink enough water and fluids to keep the urine clear or pale yellow. Cranberry juice is especially recommended, in addition to large amounts of water.   Avoid caffeine, tea, and carbonated beverages. They tend to irritate the bladder.   Alcohol may irritate the prostate.   Only take over-the-counter or prescription medicines for pain, discomfort, or fever as directed by your caregiver.  To prevent further infections:  Empty the bladder often. Avoid holding urine for long periods of time.   After a bowel movement, women should cleanse from front to back. Use each tissue only once.   Empty the bladder before and after sexual intercourse.  FINDING OUT THE RESULTS OF YOUR TEST Not all test results are available during your visit. If your or your child's test results are not back during the visit, make an appointment with your caregiver to find out the results. Do not assume everything is normal if you have not heard from your caregiver or the medical facility. It is important for you to follow up on all test results. SEEK MEDICAL CARE IF:   There is back pain.   Your baby is older than 3 months with a rectal temperature of 100.5 F (38.1 C) or higher for more than 1 day.   Your or your child's problems (symptoms) are no better in 3 days. Return sooner if you or your child is getting worse.  SEEK IMMEDIATE MEDICAL CARE IF:   There is severe back pain or lower abdominal pain.    You or your child develops chills.   You have a fever.   Your baby is older than 3 months with a rectal temperature of 102 F (38.9 C) or higher.   Your baby is 96 months old or younger with a rectal temperature of 100.4 F (38 C) or higher.   There is nausea or vomiting.   There is continued burning or discomfort with urination.  MAKE SURE YOU:   Understand these instructions.   Will watch your condition.   Will get help right away if you are not doing well or get worse.  Document Released: 07/23/2005 Document Revised: 10/02/2011 Document Reviewed: 02/25/2007 [facility] Patient Information 2012 [place], [place].

## 2012-04-21 NOTE — ED Notes (Signed)
Pt reports having a prolapsed bladder, per pt md did not feel it needed to be tacked at this point, but cont. To follow up with gyn

## 2012-04-21 NOTE — ED Notes (Signed)
Pt c/o urinary freq and pain x 1 day

## 2012-04-21 NOTE — ED Notes (Signed)
Pt reports frequent uti's, reports having dysuria earlier, pain has progressed, pt noted blood in urine this pm, u/a sent

## 2012-04-21 NOTE — ED Provider Notes (Signed)
History     CSN: [id number]  Arrival date & time 04/21/12  2248   First MD Initiated Contact with Patient 04/21/12 2338      Chief Complaint  Patient presents with  . Urinary Frequency  . Dysuria    (Consider location/radiation/quality/duration/timing/severity/associated sxs/prior treatment) Patient is a 51 y.o. female presenting with frequency and dysuria. The history is provided by the patient. No language interpreter was used.  Urinary Frequency This is a recurrent problem. The current episode started yesterday. The problem occurs constantly. The problem has not changed since onset.Pertinent negatives include no chest pain, no abdominal pain, no headaches and no shortness of breath. Nothing aggravates the symptoms. Nothing relieves the symptoms. She has tried nothing for the symptoms. The treatment provided no relief.  Dysuria  This is a recurrent problem. The current episode started 12 to 24 hours ago. The problem occurs every urination. The problem has not changed since onset.The quality of the pain is described as burning. The pain is at a severity of 9/10. The pain is severe. There has been no fever. She is not sexually active. There is no history of pyelonephritis. Associated symptoms include frequency, hematuria, hesitancy and urgency. Pertinent negatives include no chills, no sweats, no nausea, no vomiting, no discharge, no possible pregnancy and no flank pain. She has tried nothing for the symptoms. Her past medical history is significant for recurrent UTIs. Her past medical history does not include kidney stones or urological procedure.  Blood in urine is typical of her UTIs.  No f/c/r  Past Medical History  Diagnosis Date  . Diabetes mellitus 2006  . Hypertension 2012  . Colon polyps 2008    [place]--Dr Jethro Poling  . Hyperlipemia 2012  . Obesity     Past Surgical History  Procedure Date  . Vaginal hysterectomy 2006  . Breast biopsy 2003  . Tubal ligation 1990     Family History  Problem Relation Age of Onset  . Heart failure Father   . Hypertension Mother   . Hypertension Father   . Colon cancer Father 30  . Breast cancer Maternal Grandmother   . Diabetes Father   . Diabetes Paternal Grandmother     History  Substance Use Topics  . Smoking status: Never Smoker   . Smokeless tobacco: Never Used  . Alcohol Use: No    OB History    Grav Para Term Preterm Abortions TAB SAB Ect Mult Living                  Review of Systems  Constitutional: Negative for chills.  Respiratory: Negative for shortness of breath.   Cardiovascular: Negative for chest pain.  Gastrointestinal: Negative for nausea, vomiting and abdominal pain.  Genitourinary: Positive for dysuria, hesitancy, urgency, frequency and hematuria. Negative for flank pain, vaginal bleeding, vaginal discharge, difficulty urinating, genital sores and pelvic pain.  Neurological: Negative for headaches.  All other systems reviewed and are negative.    Allergies  Bactrim  Home Medications   Current Outpatient Rx  Name Route Sig Dispense Refill  . ATORVASTATIN CALCIUM 40 MG PO TABS Oral Take 1 tablet (40 mg total) by mouth daily. 30 tablet 2  . BENAZEPRIL-HYDROCHLOROTHIAZIDE 10-12.5 MG PO TABS Oral Take 1 tablet by mouth daily. 90 tablet 0  . HYDROCORTISONE ACETATE 25 MG RE SUPP Rectal Place 1 suppository (25 mg total) rectally every 12 (twelve) hours. 12 suppository 1  . METFORMIN HCL ER 500 MG PO TB24 Oral Take 2 tablets (1,000  mg total) by mouth daily with breakfast. 60 tablet 2    Repeat labs are due now    BP 146/81  Pulse 88  Temp 98.5 F (36.9 C) (Oral)  Resp 16  Ht 5\' 4"  (1.626 m)  Wt 210 lb (95.255 kg)  BMI 36.05 kg/m2  SpO2 100%  Physical Exam  Constitutional: She is oriented to person, place, and time. She appears well-developed and well-nourished. No distress.  HENT:  Head: Normocephalic and atraumatic.  Mouth/Throat: Oropharynx is clear and moist. No  oropharyngeal exudate.  Eyes: Conjunctivae are normal. Pupils are equal, round, and reactive to light.  Neck: Normal range of motion. Neck supple.  Cardiovascular: Normal rate and regular rhythm.   Pulmonary/Chest: Effort normal and breath sounds normal. She has no wheezes. She has no rales.  Abdominal: Soft. Bowel sounds are normal. There is no tenderness. There is no rebound and no guarding.  Musculoskeletal: Normal range of motion.  Neurological: She is alert and oriented to person, place, and time.  Skin: Skin is warm and dry.  Psychiatric: She has a normal mood and affect.    ED Course  Procedures (including critical care time)  Labs Reviewed  URINALYSIS, ROUTINE W REFLEX MICROSCOPIC - Abnormal; Notable for the following:    Color, Urine ORANGE (*)  BIOCHEMICALS MAY BE AFFECTED BY COLOR   APPearance CLOUDY (*)     Hgb urine dipstick LARGE (*)     Ketones, ur 15 (*)     Protein, ur 100 (*)     Nitrite POSITIVE (*)     Leukocytes, UA MODERATE (*)     All other components within normal limits  URINE MICROSCOPIC-ADD ON - Abnormal; Notable for the following:    Squamous Epithelial / LPF FEW (*)     Bacteria, UA MANY (*)     All other components within normal limits   No results found.   No diagnosis found.    MDM  This is typical of her UTI of which she has had many.  No red flags no atypical symptoms.  Exam and vitals reassuring.  Recheck in 2 days.  No indication for labs or imaging at this time.  Return for fevers, flank pain, vomiting, or any concerns.  Patient verbalizes understanding and agrees to follow up.        Jasmine Awe, MD 04/21/12 [phone]

## 2012-06-14 ENCOUNTER — Other Ambulatory Visit (INDEPENDENT_AMBULATORY_CARE_PROVIDER_SITE_OTHER): Payer: PRIVATE HEALTH INSURANCE

## 2012-06-14 DIAGNOSIS — E785 Hyperlipidemia, unspecified: Secondary | ICD-10-CM

## 2012-06-14 DIAGNOSIS — E119 Type 2 diabetes mellitus without complications: Secondary | ICD-10-CM

## 2012-06-14 LAB — HEMOGLOBIN A1C: Hgb A1c MFr Bld: 8 % — ABNORMAL HIGH (ref 4.6–6.5)

## 2012-06-14 LAB — BASIC METABOLIC PANEL
Calcium: 9.2 mg/dL (ref 8.4–10.5)
GFR: 156.93 mL/min (ref >60.00)
Potassium: 3.4 mEq/L — ABNORMAL LOW (ref 3.5–5.1)
Sodium: 135 mEq/L (ref 135–145)

## 2012-06-14 LAB — LIPID PANEL
Cholesterol: 164 mg/dL (ref 0–200)
HDL: 52.6 mg/dL (ref >39.00)
LDL Cholesterol: 96 mg/dL (ref 0–99)
VLDL: 15.2 mg/dL (ref 0.0–40.0)

## 2012-06-14 LAB — HEPATIC FUNCTION PANEL
ALT: 22 U/L (ref 0–35)
Bilirubin, Direct: 0 mg/dL (ref 0.0–0.3)
Total Bilirubin: 0.3 mg/dL (ref 0.3–1.2)
Total Protein: 6.9 g/dL (ref 6.0–8.3)

## 2012-06-16 ENCOUNTER — Telehealth: Payer: Self-pay | Admitting: Family Medicine

## 2012-06-16 NOTE — Telephone Encounter (Signed)
See labs.     KP 

## 2012-06-18 MED ORDER — ALOGLIPTIN-METFORMIN HCL 12.5-1000 MG PO TABS: 1.0000 | ORAL_TABLET | Freq: Two times a day (BID) | ORAL | Status: DC

## 2012-06-29 ENCOUNTER — Encounter: Payer: PRIVATE HEALTH INSURANCE | Admitting: Family Medicine

## 2012-06-29 DIAGNOSIS — Z0289 Encounter for other administrative examinations: Secondary | ICD-10-CM

## 2012-07-02 ENCOUNTER — Ambulatory Visit (INDEPENDENT_AMBULATORY_CARE_PROVIDER_SITE_OTHER): Payer: PRIVATE HEALTH INSURANCE | Admitting: Family Medicine

## 2012-07-02 ENCOUNTER — Telehealth: Payer: Self-pay

## 2012-07-02 VITALS — Ht 64.5 in | Wt 236.4 lb

## 2012-07-02 DIAGNOSIS — E119 Type 2 diabetes mellitus without complications: Secondary | ICD-10-CM

## 2012-07-02 MED ORDER — LIRAGLUTIDE 18 MG/3ML ~~LOC~~ SOLN: SUBCUTANEOUS | Status: DC

## 2012-07-02 MED ORDER — GLUCOSE BLOOD VI STRP: ORAL_STRIP | Status: AC

## 2012-07-02 MED ORDER — METFORMIN HCL ER 500 MG PO TB24: 1000.0000 mg | ORAL_TABLET | Freq: Every day | ORAL | Status: DC

## 2012-07-02 NOTE — Telephone Encounter (Signed)
Patient walked in and said the Angela Barr was causing her to have an upset stomach and she would like to discuss. I discussed with Dr.Lowne ad per Dr.Lowne---Today we have changed your medication back to Metformin XR 500 mg 2 po qd and have added Victoza 0.6mg  daily or 1 week then increase to 1.2 mg daily an follow up with Dr.Lowne in 2 weeks. Also check you blood sugar for two weeks and bring them with you to your follow up apt. I went over the Victoza instruction and patient voice understanding. Patient education given to the patient on DM.        KP

## 2012-07-02 NOTE — Progress Notes (Signed)
  Subjective:    Patient ID: Angela Barr, female    DOB: October 25, 1961, 51 y.o.   MRN: [id number]  HPI Pt here for victoza instruction only    Review of Systems     Objective:   Physical Exam        Assessment & Plan:

## 2012-07-02 NOTE — Patient Instructions (Addendum)
Today we have changed your medication back to Metformin XR 500 mg 2 po qd and have added Victoza 0.6mg  daily or 1 week then increase to 1.2 mg daily an follow up with Dr.Lowne in 2 weeks. Also check you blood sugar for two weeks and bring them with you to your follow up apt.      Kp

## 2012-07-16 ENCOUNTER — Other Ambulatory Visit: Payer: Self-pay | Admitting: Family Medicine

## 2012-07-16 DIAGNOSIS — I10 Essential (primary) hypertension: Secondary | ICD-10-CM

## 2012-07-16 MED ORDER — BENAZEPRIL-HYDROCHLOROTHIAZIDE 10-12.5 MG PO TABS: 1.0000 | ORAL_TABLET | Freq: Every day | ORAL | Status: DC

## 2012-07-16 NOTE — Telephone Encounter (Signed)
refill Benazepril-HCTZ  (Tab) HCTZ 10-12.5 MG Take 1 tablet by mouth daily. #30--last fill 8.16.13 Last ov 9.6.13 DM

## 2012-07-19 ENCOUNTER — Encounter: Payer: Self-pay | Admitting: Family Medicine

## 2012-07-19 ENCOUNTER — Ambulatory Visit (INDEPENDENT_AMBULATORY_CARE_PROVIDER_SITE_OTHER): Payer: 59 | Admitting: Family Medicine

## 2012-07-19 VITALS — BP 132/80 | HR 98 | Temp 98.4°F | Wt 231.8 lb

## 2012-07-19 DIAGNOSIS — E1165 Type 2 diabetes mellitus with hyperglycemia: Secondary | ICD-10-CM

## 2012-07-19 DIAGNOSIS — IMO0001 Reserved for inherently not codable concepts without codable children: Secondary | ICD-10-CM

## 2012-07-19 DIAGNOSIS — Z0000 Encounter for general adult medical examination without abnormal findings: Secondary | ICD-10-CM

## 2012-07-19 NOTE — Progress Notes (Signed)
  Subjective:     Angela Barr is a 51 y.o. female who presents for follow up of diabetes.. Current symptoms include: hypoglycemia . Patient denies foot ulcerations, hyperglycemia, increased appetite, nausea, paresthesia of the feet, polydipsia, polyuria, visual disturbances, vomiting and weight loss. Evaluation to date has been: fasting blood sugar, fasting lipid panel, hemoglobin A1C and microalbuminuria. Home sugars: BGs consistently in an acceptable range. Current treatments: more intensive attention to diet which has been effective, low cholesterol diet which has been effective, increased aerobic exercise which has been effective, Continued metformin which has been effective and Continued victoza which has been effective. Last dilated eye exam 2 years ago--- pt knows to call and schedule..  The following portions of the patient's history were reviewed and updated as appropriate: allergies, current medications, past family history, past medical history, past social history, past surgical history and problem list.  Review of Systems Pertinent items are noted in HPI.    Objective:    BP 132/80  Pulse 98  Temp 98.4 F (36.9 C) (Oral)  Wt 231 lb 12.8 oz (105.144 kg)  SpO2 98% General appearance: alert, cooperative, appears stated age and no distress Eyes: conjunctivae/corneas clear. PERRL, EOM's intact. Fundi benign. Lungs: clear to auscultation bilaterally Heart: S1, S2 normal Extremities: extremities normal, atraumatic, no cyanosis or edema   .mono  Patient was evaluated for proper footwear and sizing.  Laboratory: No components found with this basename: A1C      Lab Results  Component Value Date   HGBA1C 8.0* 06/14/2012   Assessment:    Diabetes mellitus Type II, under poor control.    Plan:    Discussed general issues about diabetes pathophysiology and management. Agricultural engineer distributed. Encouraged aerobic exercise. Discussed foot care. Reminded to get  yearly retinal exam. Discussed ways to avoid symptomatic hypoglycemia. Continued metformin; see  medication orders. Labs: fasting blood sugar, fasting lipid panel, hemoglobin A1C and microalbuminuria. Diabetes educator referral. Nutritionist referral. Reminded to bring in blood sugar diary at next visit. Follow up in 3 months or as needed.

## 2012-07-19 NOTE — Patient Instructions (Signed)

## 2012-08-05 ENCOUNTER — Ambulatory Visit: Payer: PRIVATE HEALTH INSURANCE | Admitting: *Deleted

## 2012-08-06 ENCOUNTER — Encounter: Payer: PRIVATE HEALTH INSURANCE | Admitting: Family Medicine

## 2012-11-10 ENCOUNTER — Encounter: Payer: Self-pay | Admitting: Family Medicine

## 2012-11-10 ENCOUNTER — Ambulatory Visit (INDEPENDENT_AMBULATORY_CARE_PROVIDER_SITE_OTHER): Payer: 59 | Admitting: Family Medicine

## 2012-11-10 ENCOUNTER — Ambulatory Visit: Payer: PRIVATE HEALTH INSURANCE | Admitting: Internal Medicine

## 2012-11-10 VITALS — BP 124/76 | HR 97 | Temp 98.0°F | Wt 233.8 lb

## 2012-11-10 DIAGNOSIS — L259 Unspecified contact dermatitis, unspecified cause: Secondary | ICD-10-CM

## 2012-11-10 MED ORDER — MOMETASONE FUROATE 0.1 % EX CREA: TOPICAL_CREAM | Freq: Every day | CUTANEOUS | Status: DC

## 2012-11-10 NOTE — Progress Notes (Signed)
  Subjective:     Angela Barr is a 52 y.o. female who presents for evaluation of a rash involving the lower extremity and upper extremity. Rash started 3 weeks ago. Lesions are pink, and raised in texture. Rash has changed over time. Rash causes no discomfort. Associated symptoms: none. Patient denies: abdominal pain, arthralgia, congestion, cough, crankiness, decrease in appetite, decrease in energy level, fever, headache, irritability, myalgia, nausea, sore throat and vomiting. Patient has not had contacts with similar rash. Patient has had new exposures (soaps, lotions, laundry detergents, foods, medications, plants, insects or animals).  The following portions of the patient's history were reviewed and updated as appropriate: allergies, current medications, past family history, past medical history, past social history, past surgical history and problem list.  Review of Systems Pertinent items are noted in HPI.    Objective:    BP 124/76  Pulse 97  Temp 98 F (36.7 C) (Oral)  Wt 233 lb 12.8 oz (106.051 kg)  SpO2 97% General:  alert, cooperative, appears stated age and no distress  Skin:  papules noted on extremities     Assessment:    contact dermatitis: new body lotions    Plan:    Medications: hydrocortisone and moisturizing cream. verbal and written patient instruction given. f/u prn

## 2012-11-10 NOTE — Patient Instructions (Signed)

## 2012-11-19 ENCOUNTER — Telehealth: Payer: Self-pay | Admitting: Family Medicine

## 2012-11-19 DIAGNOSIS — I10 Essential (primary) hypertension: Secondary | ICD-10-CM

## 2012-11-19 MED ORDER — BENAZEPRIL-HYDROCHLOROTHIAZIDE 10-12.5 MG PO TABS: 1.0000 | ORAL_TABLET | Freq: Every day | ORAL | Status: DC

## 2012-11-19 NOTE — Telephone Encounter (Signed)
Pt needs Mometasone sent to [facility] [address]. She no longer uses [facility].

## 2012-11-19 NOTE — Telephone Encounter (Signed)
Pt states she needs rx for benazepril sent to [facility] on [address]. Pts insurance will no longer cover rx at [facility].

## 2012-11-19 NOTE — Telephone Encounter (Signed)
Refill for Elocon cream called to [facility]

## 2012-12-03 ENCOUNTER — Other Ambulatory Visit: Payer: Self-pay | Admitting: Family Medicine

## 2013-01-24 ENCOUNTER — Encounter: Payer: Self-pay | Admitting: Family Medicine

## 2013-01-24 ENCOUNTER — Ambulatory Visit (INDEPENDENT_AMBULATORY_CARE_PROVIDER_SITE_OTHER): Payer: 59 | Admitting: Family Medicine

## 2013-01-24 VITALS — BP 122/80 | HR 94 | Temp 98.1°F | Wt 222.4 lb

## 2013-01-24 DIAGNOSIS — N390 Urinary tract infection, site not specified: Secondary | ICD-10-CM

## 2013-01-24 DIAGNOSIS — R21 Rash and other nonspecific skin eruption: Secondary | ICD-10-CM

## 2013-01-24 DIAGNOSIS — E1149 Type 2 diabetes mellitus with other diabetic neurological complication: Secondary | ICD-10-CM

## 2013-01-24 DIAGNOSIS — N6019 Diffuse cystic mastopathy of unspecified breast: Secondary | ICD-10-CM

## 2013-01-24 DIAGNOSIS — Z23 Encounter for immunization: Secondary | ICD-10-CM

## 2013-01-24 DIAGNOSIS — E1142 Type 2 diabetes mellitus with diabetic polyneuropathy: Secondary | ICD-10-CM

## 2013-01-24 DIAGNOSIS — E785 Hyperlipidemia, unspecified: Secondary | ICD-10-CM

## 2013-01-24 LAB — HEPATIC FUNCTION PANEL
ALT: 25 U/L (ref 0–35)
AST: 16 U/L (ref 0–37)
Total Bilirubin: 0.5 mg/dL (ref 0.3–1.2)
Total Protein: 7.2 g/dL (ref 6.0–8.3)

## 2013-01-24 LAB — POCT URINALYSIS DIPSTICK
Ketones, UA: NEGATIVE
pH, UA: 6

## 2013-01-24 LAB — LIPID PANEL
Cholesterol: 174 mg/dL (ref 0–200)
LDL Cholesterol: 118 mg/dL — ABNORMAL HIGH (ref 0–99)
Total CHOL/HDL Ratio: 4
Triglycerides: 55 mg/dL (ref 0.0–149.0)

## 2013-01-24 LAB — CBC WITH DIFFERENTIAL/PLATELET
Basophils Relative: 1.1 % (ref 0.0–3.0)
Eosinophils Absolute: 0.1 10*3/uL (ref 0.0–0.7)
Eosinophils Relative: 1.9 % (ref 0.0–5.0)
HCT: 39.4 % (ref 36.0–46.0)
Hemoglobin: 12.9 g/dL (ref 12.0–15.0)
MCHC: 32.8 g/dL (ref 30.0–36.0)
MCV: 81.1 fl (ref 78.0–100.0)
Monocytes Absolute: 0.3 10*3/uL (ref 0.1–1.0)
Neutro Abs: 2.6 10*3/uL (ref 1.4–7.7)
Neutrophils Relative %: 54.5 % (ref 43.0–77.0)
RBC: 4.86 Mil/uL (ref 3.87–5.11)
WBC: 4.8 10*3/uL (ref 4.5–10.5)

## 2013-01-24 LAB — BASIC METABOLIC PANEL
CO2: 26 mEq/L (ref 19–32)
Calcium: 8.9 mg/dL (ref 8.4–10.5)
Chloride: 100 mEq/L (ref 96–112)
Glucose, Bld: 119 mg/dL — ABNORMAL HIGH (ref 70–99)
Sodium: 135 mEq/L (ref 135–145)

## 2013-01-24 MED ORDER — NAFTIFINE HCL 1 % EX CREA: TOPICAL_CREAM | Freq: Every day | CUTANEOUS | Status: DC

## 2013-01-24 MED ORDER — CIPROFLOXACIN HCL 500 MG PO TABS: 500.0000 mg | ORAL_TABLET | Freq: Two times a day (BID) | ORAL | Status: AC

## 2013-01-24 NOTE — Patient Instructions (Addendum)
Urinary Tract Infection Urinary tract infections (UTIs) can develop anywhere along your urinary tract. Your urinary tract is your body's drainage system for removing wastes and extra water. Your urinary tract includes two kidneys, two ureters, a bladder, and a urethra. Your kidneys are a pair of bean-shaped organs. Each kidney is about the size of your fist. They are located below your ribs, one on each side of your spine. CAUSES Infections are caused by microbes, which are microscopic organisms, including fungi, viruses, and bacteria. These organisms are so small that they can only be seen through a microscope. Bacteria are the microbes that most commonly cause UTIs. SYMPTOMS  Symptoms of UTIs may vary by age and gender of the patient and by the location of the infection. Symptoms in young women typically include a frequent and intense urge to urinate and a painful, burning feeling in the bladder or urethra during urination. Older women and men are more likely to be tired, shaky, and weak and have muscle aches and abdominal pain. A fever may mean the infection is in your kidneys. Other symptoms of a kidney infection include pain in your back or sides below the ribs, nausea, and vomiting. DIAGNOSIS To diagnose a UTI, your caregiver will ask you about your symptoms. Your caregiver also will ask to provide a urine sample. The urine sample will be tested for bacteria and white blood cells. White blood cells are made by your body to help fight infection. TREATMENT  Typically, UTIs can be treated with medication. Because most UTIs are caused by a bacterial infection, they usually can be treated with the use of antibiotics. The choice of antibiotic and length of treatment depend on your symptoms and the type of bacteria causing your infection. HOME CARE INSTRUCTIONS  If you were prescribed antibiotics, take them exactly as your caregiver instructs you. Finish the medication even if you feel better after you  have only taken some of the medication.  Drink enough water and fluids to keep your urine clear or pale yellow.  Avoid caffeine, tea, and carbonated beverages. They tend to irritate your bladder.  Empty your bladder often. Avoid holding urine for long periods of time.  Empty your bladder before and after sexual intercourse.  After a bowel movement, women should cleanse from front to back. Use each tissue only once. SEEK MEDICAL CARE IF:   You have back pain.  You develop a fever.  Your symptoms do not begin to resolve within 3 days. SEEK IMMEDIATE MEDICAL CARE IF:   You have severe back pain or lower abdominal pain.  You develop chills.  You have nausea or vomiting.  You have continued burning or discomfort with urination. MAKE SURE YOU:   Understand these instructions.  Will watch your condition.  Will get help right away if you are not doing well or get worse. Document Released: 07/23/2005 Document Revised: 04/13/2012 Document Reviewed: 11/21/2011 ExitCare Patient Information 2013 [employer].  

## 2013-01-24 NOTE — Assessment & Plan Note (Signed)
Check labs con't meds Pt stopped victoza---secondary to cost but is willing to go back on it if necessary.

## 2013-01-24 NOTE — Progress Notes (Signed)
  Subjective:    Angela Barr is a 52 y.o. female who complains of burning with urination and hematuria. She has had symptoms for a few days. Patient also complains of back pain. Patient denies congestion, cough, fever, headache, rhinitis, sorethroat, stomach ache and vaginal discharge. Patient does not have a history of recurrent UTI. Patient does not have a history of pyelonephritis.  Pt glucose readings have been high this last week----140-150.  No other symptoms.  The following portions of the patient's history were reviewed and updated as appropriate: allergies, current medications, past family history, past medical history, past social history, past surgical history and problem list.  Review of Systems Pertinent items are noted in HPI.    Objective:    BP 122/80  Pulse 94  Temp(Src) 98.1 F (36.7 C) (Oral)  Wt 222 lb 6.4 oz (100.88 kg)  BMI 37.6 kg/m2  SpO2 97% General appearance: alert, cooperative, appears stated age and no distress Neck: no adenopathy, supple, symmetrical, trachea midline and thyroid not enlarged, symmetric, no tenderness/mass/nodules Lungs: clear to auscultation bilaterally Heart: S1, S2 normal Extremities: extremities normal, atraumatic, no cyanosis or edema  Laboratory:  Urine dipstick: sm for hemoglobin, mod for leukocyte esterase and trace for protein.   Micro exam: not done.    Assessment:    UTI     Plan:    Medications: ciprofloxacin. Maintain adequate hydration. Follow up if symptoms not improving, and as needed.

## 2013-02-02 ENCOUNTER — Other Ambulatory Visit: Payer: Self-pay | Admitting: Family Medicine

## 2013-02-03 MED ORDER — ATORVASTATIN CALCIUM 40 MG PO TABS: 40.0000 mg | ORAL_TABLET | Freq: Every day | ORAL | Status: DC

## 2013-02-03 MED ORDER — LIRAGLUTIDE 18 MG/3ML ~~LOC~~ SOLN: SUBCUTANEOUS | Status: DC

## 2013-02-03 NOTE — Telephone Encounter (Signed)
Add zetia 10 mg  #30  1 po qd ----- when combo is available again we can change it

## 2013-02-03 NOTE — Addendum Note (Signed)
Addended by: Arnette Norris on: 02/03/2013 10:14 AM   Modules accepted: Orders

## 2013-02-03 NOTE — Telephone Encounter (Signed)
Liptruzet was recalled please advise      KP

## 2013-02-03 NOTE — Telephone Encounter (Signed)
Change lipitor to liptruzet 10/40 1 po qhs , #30 2 refills victoza should be at 1.8 mg if she can tolerate it. If it is or she is unable to tolerate 1.8.---- inc metformin to 3 tabs of 500 mg  K is low--- if eating/drinking K rich foods , add kcl 20 meq 1 po qd #30 2 refills Recheck 3 months------uncontrolled DM, Hyperlipidemia ----lipid, hep, bmp, hgba1c, microalbumin, UA

## 2013-02-03 NOTE — Telephone Encounter (Addendum)
Discussed with the patient and she is not been taking her Lipitor and was only taking 1.2 of the Victoza, she has agreed to start taking her Lipitor and will increase her Victoza as well as increase her K rich foods. I called in new scripts for Victoza and her Lipitor.      KP

## 2013-03-09 ENCOUNTER — Telehealth: Payer: Self-pay | Admitting: Family Medicine

## 2013-03-09 MED ORDER — METFORMIN HCL 500 MG PO TABS: ORAL_TABLET | ORAL | Status: DC

## 2013-03-09 NOTE — Telephone Encounter (Signed)
Patient states she is changing to [facility] on [address]. Pt states she is completely out of metformin and needs a refill ASAP.

## 2013-03-11 ENCOUNTER — Encounter: Payer: Self-pay | Admitting: Family Medicine

## 2013-03-16 ENCOUNTER — Telehealth: Payer: Self-pay | Admitting: Family Medicine

## 2013-03-16 NOTE — Telephone Encounter (Signed)
Take hct away from lisinopril Her dm is not controlled .  Is it because she was not taking meds as directed?

## 2013-03-16 NOTE — Telephone Encounter (Signed)
Please advise 

## 2013-03-16 NOTE — Telephone Encounter (Signed)
Patient called stating she is having some issues with her medications.  1. metformin 1000mg  gives her loose stool and she would like to take 500 mg daily.  2. Benazepril-hctz 10-12.5mg  makes her lightheaded so she has been taking 1/2 pill daily.  3. Victoza in the pm gives her nausea.   She would like to know if these changes are okay and if she should continue taking these medications. CB# [phone]

## 2013-03-17 MED ORDER — SITAGLIP PHOS-METFORMIN HCL ER 50-500 MG PO TB24: 1.0000 | ORAL_TABLET | Freq: Every day | ORAL | Status: DC

## 2013-03-17 MED ORDER — LISINOPRIL 5 MG PO TABS: 5.0000 mg | ORAL_TABLET | Freq: Every day | ORAL | Status: DC

## 2013-03-17 NOTE — Telephone Encounter (Signed)
1) Discussed with and she is stated she took the 1.8 Victoza and it cause her to vomit, she went back down to the 1.2 and it has been causing Nausea and she would like to come off of it. Please advise?   2) she has agreed to take Lisinopril, I will fax to the pharmacy. Please advise on the dose and directions?  3)The patient stated she has been taking the Metformin tablets together and it has been causing her to have loose stools and she feels like her stomach is bubbling all day. Her blood sugars has ranged from 120-203  I made the patient aware to try taking it 1 in the morning and 1 in the evening and see if this helps. She has agreed to do so and will call if no improvement.     KP

## 2013-03-17 NOTE — Telephone Encounter (Signed)
Change to janumet xr 50/500 1 po qpm #30  2 refills--- can try samples first

## 2013-03-17 NOTE — Telephone Encounter (Signed)
Discussed other questions with Dr.Lowne and she gave the Verbal to stop the Victoza and take Lisinopril 5 mg qd.   KP

## 2013-03-17 NOTE — Telephone Encounter (Signed)
Patient has been made aware and voiced understanding, she has agreed to keep track of BS and BP.     KP

## 2013-03-23 ENCOUNTER — Telehealth: Payer: Self-pay | Admitting: Family Medicine

## 2013-03-23 MED ORDER — SITAGLIP PHOS-METFORMIN HCL ER 100-1000 MG PO TB24: 1.0000 | ORAL_TABLET | Freq: Every day | ORAL | Status: DC

## 2013-03-23 NOTE — Telephone Encounter (Signed)
Increase janument xr 100/1000--- she can take 2 of what she has  And send new dose to pharmacy

## 2013-03-23 NOTE — Telephone Encounter (Signed)
Pt was called back and stated that she takes the Janumet around 7:30-8:00pm around the time she has dinner. Pt states that her fasting sugars in the am have been consistently in the 140's since she started the medication last week. Pt states she is concerned that the medication is keeping her sugars high all day, and advised that Dr. Laury Axon would like her to stay in the 120 range. Please advise.

## 2013-03-23 NOTE — Telephone Encounter (Signed)
Patient states she is taking janumet 1x daily in the evening and her fasting glucose is ~140. She would like to know if this is what it should be. CB# [phone]

## 2013-03-23 NOTE — Telephone Encounter (Signed)
Discussed with patient and she voiced understanding. Rx has been sent and she will take 2 tabs together and call with any concerns.    KP

## 2013-03-25 ENCOUNTER — Ambulatory Visit
Admission: RE | Admit: 2013-03-25 | Discharge: 2013-03-25 | Disposition: A | Discharge status: Home / Self Care | Payer: 59 | Source: Ambulatory Visit | Attending: Family Medicine | Admitting: Family Medicine

## 2013-03-25 DIAGNOSIS — N6019 Diffuse cystic mastopathy of unspecified breast: Secondary | ICD-10-CM

## 2013-04-04 ENCOUNTER — Telehealth: Payer: Self-pay | Admitting: Family Medicine

## 2013-04-04 ENCOUNTER — Ambulatory Visit (INDEPENDENT_AMBULATORY_CARE_PROVIDER_SITE_OTHER): Payer: 59 | Admitting: Family Medicine

## 2013-04-04 ENCOUNTER — Encounter: Payer: Self-pay | Admitting: Family Medicine

## 2013-04-04 VITALS — BP 126/60 | HR 78 | Temp 98.0°F | Wt 222.0 lb

## 2013-04-04 DIAGNOSIS — J069 Acute upper respiratory infection, unspecified: Secondary | ICD-10-CM

## 2013-04-04 DIAGNOSIS — E1165 Type 2 diabetes mellitus with hyperglycemia: Secondary | ICD-10-CM

## 2013-04-04 DIAGNOSIS — IMO0002 Reserved for concepts with insufficient information to code with codable children: Secondary | ICD-10-CM

## 2013-04-04 DIAGNOSIS — I10 Essential (primary) hypertension: Secondary | ICD-10-CM

## 2013-04-04 DIAGNOSIS — E785 Hyperlipidemia, unspecified: Secondary | ICD-10-CM

## 2013-04-04 DIAGNOSIS — IMO0001 Reserved for inherently not codable concepts without codable children: Secondary | ICD-10-CM

## 2013-04-04 DIAGNOSIS — E118 Type 2 diabetes mellitus with unspecified complications: Secondary | ICD-10-CM

## 2013-04-04 LAB — BASIC METABOLIC PANEL
CO2: 26 mEq/L (ref 19–32)
Calcium: 9.1 mg/dL (ref 8.4–10.5)
GFR: 110.11 mL/min (ref >60.00)
Glucose, Bld: 85 mg/dL (ref 70–99)
Potassium: 4 mEq/L (ref 3.5–5.1)
Sodium: 139 mEq/L (ref 135–145)

## 2013-04-04 LAB — MICROALBUMIN / CREATININE URINE RATIO
Creatinine,U: 182.3 mg/dL
Microalb Creat Ratio: 0.7 mg/g (ref 0.0–30.0)
Microalb, Ur: 1.3 mg/dL (ref 0.0–1.9)

## 2013-04-04 LAB — HEPATIC FUNCTION PANEL
ALT: 17 U/L (ref 0–35)
AST: 14 U/L (ref 0–37)
Albumin: 3.7 g/dL (ref 3.5–5.2)
Alkaline Phosphatase: 46 U/L (ref 39–117)
Total Protein: 7.2 g/dL (ref 6.0–8.3)

## 2013-04-04 MED ORDER — MOMETASONE FUROATE 50 MCG/ACT NA SUSP: 2.0000 | Freq: Every day | NASAL | Status: DC

## 2013-04-04 MED ORDER — METFORMIN HCL ER 750 MG PO TB24: 750.0000 mg | ORAL_TABLET | Freq: Every day | ORAL | Status: DC

## 2013-04-04 NOTE — Telephone Encounter (Signed)
Patient Information:  Caller Name: Angela Barr  Phone: [phone]  Patient: Angela Barr  Gender: Female  DOB: Mar 18, 1961  Age: 52 Years  PCP: Lelon Perla.  Pregnant: No  Office Follow Up:  Does the office need to follow up with this patient?: No  Instructions For The Office: N/A   Symptoms  Reason For Call & Symptoms: Pt is calling to say that she has a sore scratchy throat. It hurts to swallow. Onset Friday 04/01/13. Pt also has a cough. Pt is calling to make sure that these sx are not related to her starting Lisinopril 2 weeks ago. and Janumet which she started at the same time. No fever. Pt is coughing up large amounts of mucous.  Reviewed Health History In EMR: Yes  Reviewed Medications In EMR: Yes  Reviewed Allergies In EMR: Yes  Reviewed Surgeries / Procedures: Yes  Date of Onset of Symptoms: 04/01/2013 OB / GYN:  LMP: Unknown  Guideline(s) Used:  Cough  Disposition Per Guideline:   See Today or Tomorrow in Office  Reason For Disposition Reached:   Patient wants to be seen  Advice Given:  N/A  Patient Will Follow Care Advice:  YES  Appointment Scheduled:  04/04/2013 11:30:00 Appointment Scheduled Provider:  Lelon Perla.

## 2013-04-04 NOTE — Patient Instructions (Signed)

## 2013-04-04 NOTE — Progress Notes (Signed)
  Subjective:     Angela Barr is a 52 y.o. female who presents for evaluation of symptoms of a URI. Symptoms include no  fever, productive cough with  clear colored sputum and sore throat. Onset of symptoms was 4 days ago, and has been gradually worsening since that time. Treatment to date: none.  Pt also here to discuss her DM meds.  The janumet is too expensive.  The metformin xr 500 2 tabs daily caused loose stools. We are also drawing labs today.  The following portions of the patient's history were reviewed and updated as appropriate: allergies, current medications, past family history, past medical history, past social history, past surgical history and problem list.  Review of Systems Pertinent items are noted in HPI.   Objective:    BP 126/60  Pulse 78  Temp(Src) 98 F (36.7 C) (Oral)  Wt 222 lb (100.699 kg)  BMI 37.53 kg/m2  SpO2 97% General appearance: alert, cooperative, appears stated age and no distress Ears: normal TM's and external ear canals both ears Nose: Nares normal. Septum midline. Mucosa normal. No drainage or sinus tenderness. Throat: abnormal findings: mild oropharyngeal erythema and PND Neck: no adenopathy, supple, symmetrical, trachea midline and thyroid not enlarged, symmetric, no tenderness/mass/nodules Lungs: clear to auscultation bilaterally Heart: S1, S2 normal Extremities: extremities normal, atraumatic, no cyanosis or edema   Assessment:    viral upper respiratory illness   Plan:    Discussed diagnosis and treatment of URI. Suggested symptomatic OTC remedies. Nasal saline spray for congestion. Nasal steroids per orders. Follow up as needed. antihistamine and rto prn

## 2013-04-04 NOTE — Assessment & Plan Note (Signed)
Stable con't meds 

## 2013-04-04 NOTE — Telephone Encounter (Signed)
Noted pt has an appt

## 2013-04-04 NOTE — Assessment & Plan Note (Signed)
Metformin xr 750 mg 1 po qhs  #30  2 refills Check labs  Repeat labs 3 months

## 2013-04-05 LAB — POCT URINALYSIS DIPSTICK
Leukocytes, UA: NEGATIVE
Protein, UA: NEGATIVE
Spec Grav, UA: 1.015
Urobilinogen, UA: 0.2

## 2013-04-26 ENCOUNTER — Ambulatory Visit: Payer: 59 | Admitting: Family Medicine

## 2013-04-26 DIAGNOSIS — Z0289 Encounter for other administrative examinations: Secondary | ICD-10-CM

## 2013-06-20 ENCOUNTER — Other Ambulatory Visit: Payer: Self-pay | Admitting: Family Medicine

## 2013-06-23 ENCOUNTER — Telehealth: Payer: Self-pay | Admitting: Family Medicine

## 2013-06-23 NOTE — Telephone Encounter (Signed)
Pt results were mailed to her.  She had just started back on meds---  Repeat in Sept If symptomatic---  ov

## 2013-06-23 NOTE — Telephone Encounter (Signed)
Patient Information:  Caller Name: Angela Barr  Phone: [phone]  Patient: Angela Barr  Gender: Female  DOB: 24-Aug-1961  Age: 52 Years  PCP: Lelon Perla.  Pregnant: No  Office Follow Up:  Does the office need to follow up with this patient?: Yes  Instructions For The Office: PLS SEE RN NOTE  RN Note:  Pt has history of HTN and DM.  Pt has had intermittent dizziness w/ not feeling well after meals,  onset 8-23.  BP has been normal per Pt, BP 120/78.  Pt states, when the dizziness begins, she feels like she will faint.  Pt denies severe dizziness at time of call.  Pt is hydrated. Current Blood Sugar 133.  Pt denies dizziness at time of call.  Pt takes her meds as directed.  Pt has not heard back from A1C results from June.  All emergent symptoms ruled out per Dizziness protocol.  PLEASE REVIEW PT'S SYMPTOMS W/ MD AND F/U W/ PT.  Symptoms  Reason For Call & Symptoms: Dizziness w/ History of DM  Reviewed Health History In EMR: Yes  Reviewed Medications In EMR: Yes  Reviewed Allergies In EMR: Yes  Reviewed Surgeries / Procedures: Yes  Date of Onset of Symptoms: 06/18/2013 OB / GYN:  LMP: Unknown  Guideline(s) Used:  Dizziness  Disposition Per Guideline:   Discuss with PCP and Callback by Nurse Today  Reason For Disposition Reached:   Diabetes  Advice Given:  N/A  Patient Will Follow Care Advice:  YES

## 2013-06-23 NOTE — Telephone Encounter (Signed)
Pleas advise.

## 2013-06-23 NOTE — Telephone Encounter (Signed)
Patient is calling to request her lab results from 04/05/13. Says she was not able to see them online and feels as though there may be issues with her blood sugar levels. Please advise.

## 2013-06-24 ENCOUNTER — Ambulatory Visit (INDEPENDENT_AMBULATORY_CARE_PROVIDER_SITE_OTHER): Payer: 59 | Admitting: Family Medicine

## 2013-06-24 ENCOUNTER — Encounter: Payer: Self-pay | Admitting: Family Medicine

## 2013-06-24 VITALS — BP 124/70 | HR 92 | Temp 98.3°F | Wt 231.2 lb

## 2013-06-24 DIAGNOSIS — R42 Dizziness and giddiness: Secondary | ICD-10-CM

## 2013-06-24 DIAGNOSIS — E785 Hyperlipidemia, unspecified: Secondary | ICD-10-CM

## 2013-06-24 DIAGNOSIS — I10 Essential (primary) hypertension: Secondary | ICD-10-CM

## 2013-06-24 DIAGNOSIS — E1165 Type 2 diabetes mellitus with hyperglycemia: Secondary | ICD-10-CM

## 2013-06-24 DIAGNOSIS — R079 Chest pain, unspecified: Secondary | ICD-10-CM

## 2013-06-24 NOTE — Telephone Encounter (Signed)
Pt has an appt at 11:30 today.

## 2013-06-24 NOTE — Telephone Encounter (Signed)
Notes Recorded by Lelon Perla, DO on 04/04/2013 at 6:24 PM Just started back on metformin-- con't meds and recheck 3 months    Discussed with patient and she voiced understanding. She is coming in today to discuss her increased dizziness.       KP

## 2013-06-24 NOTE — Patient Instructions (Addendum)

## 2013-06-25 DIAGNOSIS — E785 Hyperlipidemia, unspecified: Secondary | ICD-10-CM | POA: Insufficient documentation

## 2013-06-25 DIAGNOSIS — E669 Obesity, unspecified: Secondary | ICD-10-CM | POA: Insufficient documentation

## 2013-06-25 NOTE — Assessment & Plan Note (Signed)
Cont meds   

## 2013-06-25 NOTE — Assessment & Plan Note (Signed)
Stable con't meds 

## 2013-06-25 NOTE — Assessment & Plan Note (Signed)
Check labs 

## 2013-06-25 NOTE — Progress Notes (Signed)
  Subjective:     Angela Barr is a 52 y.o. female who presents for evaluation of dizziness. The symptoms started several days ago and are ongoing. The attacks occur intermittently and last a few seconds. Positions that worsen symptoms: any motion. Previous workup/treatments: none. Associated ear symptoms: none. Associated CNS symptoms: none. Recent infections: none. Head trauma: denied. Drug ingestion: none. Noise exposure: no occupational exposure. Family history: non-contributory.  The following portions of the patient's history were reviewed and updated as appropriate: allergies, current medications, past family history, past medical history, past social history, past surgical history and problem list.  Review of Systems Pertinent items are noted in HPI.    Objective:    BP 124/70  Pulse 92  Temp(Src) 98.3 F (36.8 C) (Oral)  Wt 231 lb 3.2 oz (104.872 kg)  BMI 39.09 kg/m2  SpO2 95% General appearance: alert, cooperative, appears stated age and no distress Eyes: conjunctivae/corneas clear. PERRL, EOM's intact. Fundi benign. Ears: normal TM's and external ear canals both ears Nose: Nares normal. Septum midline. Mucosa normal. No drainage or sinus tenderness. Throat: lips, mucosa, and tongue normal; teeth and gums normal Neck: no adenopathy, supple, symmetrical, trachea midline and thyroid not enlarged, symmetric, no tenderness/mass/nodules Lungs: clear to auscultation bilaterally Heart: S1, S2 normal Extremities: extremities normal, atraumatic, no cyanosis or edema Neurologic: Alert and oriented X 3, normal strength and tone. Normal symmetric reflexes. Normal coordination and gait     EKG--NSR  Assessment:    Vertigo    Plan:    check labs and f/u prn.  If Chest pain returns go to er

## 2013-06-28 ENCOUNTER — Other Ambulatory Visit: Payer: 59

## 2013-06-29 ENCOUNTER — Ambulatory Visit: Payer: 59 | Admitting: Family Medicine

## 2013-06-29 ENCOUNTER — Telehealth: Payer: Self-pay | Admitting: Family Medicine

## 2013-06-29 ENCOUNTER — Other Ambulatory Visit: Payer: 59

## 2013-06-29 LAB — BASIC METABOLIC PANEL
Calcium: 9.1 mg/dL (ref 8.4–10.5)
GFR: 132.26 mL/min (ref >60.00)
Glucose, Bld: 144 mg/dL — ABNORMAL HIGH (ref 70–99)
Sodium: 135 mEq/L (ref 135–145)

## 2013-06-29 LAB — HEPATIC FUNCTION PANEL
AST: 14 U/L (ref 0–37)
Alkaline Phosphatase: 51 U/L (ref 39–117)
Bilirubin, Direct: 0 mg/dL (ref 0.0–0.3)
Total Bilirubin: 0.3 mg/dL (ref 0.3–1.2)

## 2013-06-29 LAB — TSH: TSH: 0.69 u[IU]/mL (ref 0.35–5.50)

## 2013-06-29 LAB — CBC WITH DIFFERENTIAL/PLATELET
Basophils Absolute: 0.1 10*3/uL (ref 0.0–0.1)
Hemoglobin: 12.9 g/dL (ref 12.0–15.0)
Lymphocytes Relative: 32.2 % (ref 12.0–46.0)
Monocytes Relative: 6.4 % (ref 3.0–12.0)
Neutro Abs: 3 10*3/uL (ref 1.4–7.7)
RDW: 14.7 % — ABNORMAL HIGH (ref 11.5–14.6)

## 2013-06-29 LAB — HEMOGLOBIN A1C: Hgb A1c MFr Bld: 7.6 % — ABNORMAL HIGH (ref 4.6–6.5)

## 2013-06-29 NOTE — Telephone Encounter (Signed)
Patient states that she has been feeling light-headed and dizzy all day. She just left work where she almost had a fall because of these symptoms. 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 She says that when he looked in her ears he noticed a lot of fluid; more in the RT than LT.  Patient cancelled her appointment that she scheduled for tomorrow because she cannot come due to work. Wants to know if Dr. Laury Axon is willing to call in something for her instead. Please advise.

## 2013-06-29 NOTE — Telephone Encounter (Signed)
She can use her nasal spray (nasonex) and an otc antihistamine like zyrtec or allegra.  If it is only fluid that should help.  If she is already doing that she needs ov.

## 2013-06-29 NOTE — Telephone Encounter (Signed)
Please advise    Kp

## 2013-06-30 ENCOUNTER — Ambulatory Visit (INDEPENDENT_AMBULATORY_CARE_PROVIDER_SITE_OTHER): Payer: 59 | Admitting: Family Medicine

## 2013-06-30 ENCOUNTER — Encounter: Payer: Self-pay | Admitting: Family Medicine

## 2013-06-30 DIAGNOSIS — H6590 Unspecified nonsuppurative otitis media, unspecified ear: Secondary | ICD-10-CM

## 2013-06-30 MED ORDER — METHYLPREDNISOLONE ACETATE 80 MG/ML IJ SUSP
80.0000 mg | Freq: Once | INTRAMUSCULAR | Status: AC
  Administered 2013-06-30: 80 mg via INTRAMUSCULAR

## 2013-06-30 NOTE — Telephone Encounter (Signed)
Dm not controlled----- janumet xr 100/1000 1 po qpm #30 2 refills D/c metformin Recheck 3 months------250.01 bmp, hep, hgba1c, lipid,microalbumin

## 2013-06-30 NOTE — Patient Instructions (Addendum)
Serous Otitis Media   Serous otitis media is also known as otitis media with effusion (OME). It means there is fluid in the middle ear space. This space contains the bones for hearing and air. Air in the middle ear space helps to transmit sound.   The air gets there through the eustachian tube. This tube goes from the back of the throat to the middle ear space. It keeps the pressure in the middle ear the same as the outside world. It also helps to drain fluid from the middle ear space.  CAUSES   OME occurs when the eustachian tube gets blocked. Blockage can come from:  · Ear infections.  · Colds and other upper respiratory infections.  · Allergies.  · Irritants such as cigarette smoke.  · Sudden changes in air pressure (such as descending in an airplane).  · Enlarged adenoids.  During colds and upper respiratory infections, the middle ear space can become temporarily filled with fluid. This can happen after an ear infection also. Once the infection clears, the fluid will generally drain out of the ear through the eustachian tube. If it does not, then OME occurs.  SYMPTOMS   · Hearing loss.  · A feeling of fullness in the ear  but no pain.  · Young children may not show any symptoms.  DIAGNOSIS   · Diagnosis of OME is made by an ear exam.  · Tests may be done to check on the movement of the eardrum.  · Hearing exams may be done.  TREATMENT   · The fluid most often goes away without treatment.  · If allergy is the cause, allergy treatment may be helpful.  · Fluid that persists for several months may require minor surgery. A small tube is placed in the ear drum to:  · Drain the fluid.  · Restore the air in the middle ear space.  · In certain situations, antibiotics are used to avoid surgery.  · Surgery may be done to remove enlarged adenoids (if this is the cause).  HOME CARE INSTRUCTIONS   · Keep children away from tobacco smoke.  · Be sure to keep follow up appointments, if any.  SEEK MEDICAL CARE IF:   · Hearing is  not better in 3 months.  · Hearing is worse.  · Ear pain.  · Drainage from the ear.  · Dizziness.  Document Released: 01/03/2004 Document Revised: 01/05/2012 Document Reviewed: 11/02/2008  [employer]® Patient Information ©2014 [employer].

## 2013-06-30 NOTE — Assessment & Plan Note (Signed)
nasonex otc antihistamine Depo medrol given

## 2013-06-30 NOTE — Progress Notes (Signed)
  Subjective:     Angela Barr is a 52 y.o. female who presents for evaluation of bilateral ear pain. Symptoms have been present for several days. She also notes decreased hearing in both ears, no drainage, moderate pain in both ears and a plugged sensation in both ears. She does not have a history of ear infections. She does not have a history of recent swimming.  The patient's history has been marked as reviewed and updated as appropriate.   Review of Systems Pertinent items are noted in HPI.   Objective:    BP 118/70  Pulse 88  Temp(Src) 98.3 F (36.8 C) (Oral)  Wt 230 lb (104.327 kg)  BMI 38.88 kg/m2  SpO2 99% General:  alert, cooperative, appears stated age and no distress  Right Ear: right TM diminished mobility and dull  Left Ear: left TM diminished mobility, dull and air-fluid level  Mouth:  lips, mucosa, and tongue normal; teeth and gums normal  Neck: no adenopathy, no carotid bruit, no JVD, supple, symmetrical, trachea midline and thyroid not enlarged, symmetric, no tenderness/mass/nodules       Assessment:    Bilateral serous otitis    Plan:    Treatment: antihistamine and nasonex. OTC analgesia as needed. Water exclusion from affected ear until symptoms resolve. Follow up in a few days if symptoms not improving.

## 2013-06-30 NOTE — Telephone Encounter (Signed)
msg left to call the office     KP 

## 2013-07-01 ENCOUNTER — Other Ambulatory Visit: Payer: Self-pay

## 2013-07-01 DIAGNOSIS — E1165 Type 2 diabetes mellitus with hyperglycemia: Secondary | ICD-10-CM

## 2013-07-01 MED ORDER — METFORMIN HCL ER 750 MG PO TB24: 1500.0000 mg | ORAL_TABLET | Freq: Every day | ORAL | Status: DC

## 2013-07-01 NOTE — Telephone Encounter (Signed)
Discussed with patient and she voiced understanding, she stated she tried Janumet XR and the cost was the problem. Per Dr.Lowne we can change to Metformin XR 750 2 po qd. The patient agrees with the new plan. Copy mailed and medication sent.        KP

## 2013-08-19 ENCOUNTER — Other Ambulatory Visit (INDEPENDENT_AMBULATORY_CARE_PROVIDER_SITE_OTHER): Payer: 59

## 2013-08-19 ENCOUNTER — Other Ambulatory Visit: Payer: Self-pay

## 2013-08-19 DIAGNOSIS — R319 Hematuria, unspecified: Secondary | ICD-10-CM

## 2013-08-19 LAB — POCT URINALYSIS DIPSTICK
Protein, UA: 100
Spec Grav, UA: >=1.03
Urobilinogen, UA: 0.2

## 2013-08-19 MED ORDER — CIPROFLOXACIN HCL 500 MG PO TABS: 500.0000 mg | ORAL_TABLET | Freq: Two times a day (BID) | ORAL | Status: DC

## 2013-08-22 ENCOUNTER — Telehealth: Payer: Self-pay | Admitting: *Deleted

## 2013-08-24 NOTE — Telephone Encounter (Signed)
error 

## 2013-10-03 ENCOUNTER — Other Ambulatory Visit: Payer: Self-pay | Admitting: Family Medicine

## 2013-12-08 ENCOUNTER — Telehealth: Payer: Self-pay | Admitting: *Deleted

## 2013-12-08 NOTE — Telephone Encounter (Signed)
Pt states is having chest pressure more so in the evenings x 3days , wants to know if she should take a antacid or be checked  Call back info- [phone].

## 2013-12-09 NOTE — Telephone Encounter (Signed)
Spoke with patient who states she took Prilosec last evening when she had the discomfort and the pressure went away. Patients stated the discomfort always occurs after she eats an heavy or spicy meal and then goes to bed. Educated patient on foods/drinks that trigger excess acid such as caffeine, peppermint, chocolate and tomato based foods. Advised pt to limit those and see is it helps the discomfort. Also advised pt to avoid large meals and try and break them up into 4 or more smaller meals throughout he day. Patient states that she had no arm/jaw pain, no typical CP, no diaphoresis or back pain. She feels certain this is acid reflux and will follow advise given. Advised pt to call the office if pain returns and is not relieved by OTC remedies. Pt declined appt today as she "does not feel it is necessary".

## 2014-02-04 ENCOUNTER — Ambulatory Visit: Payer: 59 | Admitting: Internal Medicine

## 2014-03-14 ENCOUNTER — Other Ambulatory Visit: Payer: Self-pay | Admitting: Family Medicine

## 2014-04-17 ENCOUNTER — Other Ambulatory Visit: Payer: Self-pay | Admitting: Family Medicine

## 2014-04-20 ENCOUNTER — Emergency Department (HOSPITAL_BASED_OUTPATIENT_CLINIC_OR_DEPARTMENT_OTHER): Payer: 59

## 2014-04-20 ENCOUNTER — Emergency Department (HOSPITAL_BASED_OUTPATIENT_CLINIC_OR_DEPARTMENT_OTHER)
Admission: EM | Admit: 2014-04-20 | Discharge: 2014-04-21 | Disposition: A | Discharge status: Home / Self Care | Payer: 59 | Attending: Emergency Medicine | Admitting: Emergency Medicine

## 2014-04-20 ENCOUNTER — Encounter (HOSPITAL_BASED_OUTPATIENT_CLINIC_OR_DEPARTMENT_OTHER): Payer: Self-pay | Admitting: Emergency Medicine

## 2014-04-20 DIAGNOSIS — I10 Essential (primary) hypertension: Secondary | ICD-10-CM | POA: Insufficient documentation

## 2014-04-20 DIAGNOSIS — E119 Type 2 diabetes mellitus without complications: Secondary | ICD-10-CM | POA: Insufficient documentation

## 2014-04-20 DIAGNOSIS — IMO0002 Reserved for concepts with insufficient information to code with codable children: Secondary | ICD-10-CM | POA: Insufficient documentation

## 2014-04-20 DIAGNOSIS — M25559 Pain in unspecified hip: Secondary | ICD-10-CM | POA: Insufficient documentation

## 2014-04-20 DIAGNOSIS — Z860100 Personal history of colon polyps, unspecified: Secondary | ICD-10-CM | POA: Insufficient documentation

## 2014-04-20 DIAGNOSIS — Z792 Long term (current) use of antibiotics: Secondary | ICD-10-CM | POA: Insufficient documentation

## 2014-04-20 DIAGNOSIS — E785 Hyperlipidemia, unspecified: Secondary | ICD-10-CM | POA: Insufficient documentation

## 2014-04-20 DIAGNOSIS — M25552 Pain in left hip: Secondary | ICD-10-CM

## 2014-04-20 DIAGNOSIS — Z79899 Other long term (current) drug therapy: Secondary | ICD-10-CM | POA: Insufficient documentation

## 2014-04-20 DIAGNOSIS — E669 Obesity, unspecified: Secondary | ICD-10-CM | POA: Insufficient documentation

## 2014-04-20 NOTE — ED Provider Notes (Signed)
CSN: [id number]     Arrival date & time 04/20/14  2207 History  This chart was scribed for Angela Speak, MD by Elby Beck, ED Scribe. This patient was seen in room MH10/MH10 and the patient's care was started at 11:24 PM.   Chief Complaint  Patient presents with  . Leg Pain    The history is provided by the patient. No language interpreter was used.    HPI Comments: Angela Barr is a 53 y.o. Female with a history of DM and HTN who presents to the Emergency Department complaining of intermittent, moderate left upper medial thigh pain over the past few days. She describes her pain as "aching". She states that her pain is worsened with walking. She denies any injury or change in activity. She states that she would like to have a DVT ruled out tonight. She states that she has not tried any medications for her pain. Pt denies any chest pain, SOB or back pain.   Past Medical History  Diagnosis Date  . Diabetes mellitus 2006  . Hypertension 2012  . Colon polyps 2008    [name]  . Hyperlipemia 2012  . Obesity    Past Surgical History  Procedure Laterality Date  . Vaginal hysterectomy  2006  . Breast biopsy  2003  . Tubal ligation  1990   Family History  Problem Relation Age of Onset  . Heart failure Father   . Hypertension Mother   . Hypertension Father   . Colon cancer Father 73  . Breast cancer Maternal Grandmother   . Diabetes Father   . Diabetes Paternal Grandmother    History  Substance Use Topics  . Smoking status: Never Smoker   . Smokeless tobacco: Never Used  . Alcohol Use: No   OB History   Grav Para Term Preterm Abortions TAB SAB Ect Mult Living                 Review of Systems A complete 10 system review of systems was obtained and all systems are negative except as noted in the HPI and PMH.   Allergies  Bactrim  Home Medications   Prior to Admission medications   Medication Sig Start Date End Date Taking? Authorizing Provider   atorvastatin (LIPITOR) 40 MG tablet Take 1 tablet (40 mg total) by mouth daily. 02/03/13 02/03/14  Rosalita Chessman, DO  ciprofloxacin (CIPRO) 500 MG tablet Take 1 tablet (500 mg total) by mouth 2 (two) times daily. 08/19/13   Rosalita Chessman, DO  glucose blood test strip  07/02/12   Historical Provider, MD  lisinopril (PRINIVIL,ZESTRIL) 5 MG tablet TAKE 1 TABLET BY MOUTH DAILY. OFFICE VISIT DUE NOW 04/17/14   Rosalita Chessman, DO  metFORMIN (GLUCOPHAGE-XR) 750 MG 24 hr tablet TAKE 2 TABLETS BY MOUTH DAILY WITH BREAKFAST 04/17/14   Alferd Apa Lowne, DO  mometasone (ELOCON) 0.1 % cream Apply topically daily. 11/10/12   Alferd Apa Lowne, DO  mometasone (NASONEX) 50 MCG/ACT nasal spray Place 2 sprays into the nose daily. 04/04/13   Rosalita Chessman, DO  naftifine (NAFTIN) 1 % cream Apply topically daily. 01/24/13   Rosalita Chessman, DO   Triage Vitals: BP 138/86  Pulse 86  Temp(Src) 98 F (36.7 C) (Oral)  Resp 20  Ht 5\' 4"  (1.626 m)  Wt 240 lb (108.863 kg)  BMI 41.18 kg/m2  SpO2 99%  Physical Exam  Nursing note and vitals reviewed. Constitutional: She is oriented to person, place, and  time. She appears well-developed and well-nourished. No distress.  HENT:  Head: Normocephalic and atraumatic.  Eyes: Conjunctivae and EOM are normal.  Neck: Neck supple. No tracheal deviation present.  Cardiovascular: Normal rate.   Pulmonary/Chest: Effort normal. No respiratory distress.  Musculoskeletal: Normal range of motion.  The left lower extremity appears grossly normal. There is no calf or thigh tenderness. Homan's sign is absent. Strength is 5/5 in the bilateral lower extremities. Patellar reflexes are 3+ and equal. There is pain with ROM of the left hip.   Neurological: She is alert and oriented to person, place, and time.  Skin: Skin is warm and dry.  Psychiatric: She has a normal mood and affect. Her behavior is normal.    ED Course  Procedures (including critical care time)  DIAGNOSTIC STUDIES: Oxygen  Saturation is 99% on RA, normal by my interpretation.    COORDINATION OF CARE: 11:29 PM- Discussed plan to rule out a DVT with an [place]. Will also obtain an X-ray of pt's hip. Pt advised of plan for treatment and pt agrees.  Labs Review Labs Reviewed - No data to display  Imaging Review No results found.   EKG Interpretation None      MDM   Final diagnoses:  None    X-rays are negative and ultrasound was negative as well. This appears to be some sort of soft tissue injury to the hip flexor. I will recommend ibuprofen and followup with primary Dr. if not improving in the next week.   I personally performed the services described in this documentation, which was scribed in my presence. The recorded information has been reviewed and is accurate.     Angela Speak, MD 04/21/14 [id number]

## 2014-04-20 NOTE — ED Notes (Addendum)
C/o "kink" in left upper leg/thigh since Sunday-denies injury

## 2014-04-21 ENCOUNTER — Ambulatory Visit: Payer: 59 | Admitting: Internal Medicine

## 2014-04-21 NOTE — ED Notes (Signed)
Left upper leg pain x 1 week  Denies inj

## 2014-04-21 NOTE — Discharge Instructions (Signed)
Ibuprofen 600 mg 3 times daily for the next 5 days.  Followup with your primary Dr. if your symptoms are not improving in the next week.   Hip Pain The hips join the upper legs to the lower pelvis. The bones, cartilage, tendons, and muscles of the hip joint perform a lot of work each day holding your body weight and allowing you to move around. Hip pain is a common symptom. It can range from a minor ache to severe pain on 1 or both hips. Pain may be felt on the inside of the hip joint near the groin, or the outside near the buttocks and upper thigh. There may be swelling or stiffness as well. It occurs more often when a person walks or performs activity. There are many reasons hip pain can develop. CAUSES  It is important to work with your caregiver to identify the cause since many conditions can impact the bones, cartilage, muscles, and tendons of the hips. Causes for hip pain include:  Broken (fractured) bones.  Separation of the thighbone from the hip socket (dislocation).  Torn cartilage of the hip joint.  Swelling (inflammation) of a tendon (tendonitis), the sac within the hip joint (bursitis), or a joint.  A weakening in the abdominal wall (hernia), affecting the nerves to the hip.  Arthritis in the hip joint or lining of the hip joint.  Pinched nerves in the back, hip, or upper thigh.  A bulging disc in the spine (herniated disc).  Rarely, bone infection or cancer. DIAGNOSIS  The location of your hip pain will help your caregiver understand what may be causing the pain. A diagnosis is based on your medical history, your symptoms, results from your physical exam, and results from diagnostic tests. Diagnostic tests may include X-ray exams, a computerized magnetic scan (magnetic resonance imaging, MRI), or bone scan. TREATMENT  Treatment will depend on the cause of your hip pain. Treatment may include:  Limiting activities and resting until symptoms improve.  Crutches or other  walking supports (a cane or brace).  Ice, elevation, and compression.  Physical therapy or home exercises.  Shoe inserts or special shoes.  Losing weight.  Medications to reduce pain.  Undergoing surgery. HOME CARE INSTRUCTIONS   Only take over-the-counter or prescription medicines for pain, discomfort, or fever as directed by your caregiver.  Put ice on the injured area:  Put ice in a plastic bag.  Place a towel between your skin and the bag.  Leave the ice on for 15-20 minutes at a time, 03-04 times a day.  Keep your leg raised (elevated) when possible to lessen swelling.  Avoid activities that cause pain.  Follow specific exercises as directed by your caregiver.  Sleep with a pillow between your legs on your most comfortable side.  Record how often you have hip pain, the location of the pain, and what it feels like. This information may be helpful to you and your caregiver.  Ask your caregiver about returning to work or sports and whether you should drive.  Follow up with your caregiver for further exams, therapy, or testing as directed. SEEK MEDICAL CARE IF:   Your pain or swelling continues or worsens after 1 week.  You are feeling unwell or have chills.  You have increasing difficulty with walking.  You have a loss of sensation or other new symptoms.  You have questions or concerns. SEEK IMMEDIATE MEDICAL CARE IF:   You cannot put weight on the affected hip.  You  have fallen.  You have a sudden increase in pain and swelling in your hip.  You have a fever. MAKE SURE YOU:   Understand these instructions.  Will watch your condition.  Will get help right away if you are not doing well or get worse. Document Released: 04/02/2010 Document Revised: 01/05/2012 Document Reviewed: 04/02/2010 [facility] Patient Information 2015 [place], [place]. This information is not intended to replace advice given to you by your health care provider. Make sure you discuss  any questions you have with your health care provider.

## 2014-05-12 ENCOUNTER — Telehealth: Payer: Self-pay

## 2014-05-12 NOTE — Telephone Encounter (Signed)
LVM with pt to call back and schedule CPE with PCP

## 2014-05-18 ENCOUNTER — Other Ambulatory Visit: Payer: Self-pay | Admitting: Family Medicine

## 2014-05-18 NOTE — Telephone Encounter (Signed)
Refill Requests:  lisinopril (PRINIVIL,ZESTRIL) 5 MG tablet--TAKE 1 TABLET BY MOUTH DAILY. OFFICE VISIT DUE NOW Last Filled:  04/17/14 Amt Filled:   30 tablets, 0 refills  metFORMIN (GLUCOPHAGE-XR) 750 MG 24 hr tablet--TAKE 2 TABLETS BY MOUTH DAILY WITH BREAKFAST Last Filled:  04/17/14 Amt Filled: 60 tablets, 0 refills  Last OV:  06/30/13 Next OV:  06/01/14-CPE  Meds refilled for 1 month given upcoming appointment.

## 2014-06-01 ENCOUNTER — Encounter: Payer: 59 | Admitting: Family Medicine

## 2014-06-01 DIAGNOSIS — Z0289 Encounter for other administrative examinations: Secondary | ICD-10-CM

## 2014-06-13 ENCOUNTER — Encounter: Payer: Self-pay | Admitting: Family Medicine

## 2014-06-13 ENCOUNTER — Ambulatory Visit (INDEPENDENT_AMBULATORY_CARE_PROVIDER_SITE_OTHER): Payer: 59 | Admitting: Family Medicine

## 2014-06-13 VITALS — BP 122/70 | HR 82 | Temp 98.0°F | Wt 243.0 lb

## 2014-06-13 DIAGNOSIS — E1159 Type 2 diabetes mellitus with other circulatory complications: Secondary | ICD-10-CM

## 2014-06-13 DIAGNOSIS — E785 Hyperlipidemia, unspecified: Secondary | ICD-10-CM

## 2014-06-13 DIAGNOSIS — R102 Pelvic and perineal pain: Secondary | ICD-10-CM

## 2014-06-13 DIAGNOSIS — N949 Unspecified condition associated with female genital organs and menstrual cycle: Secondary | ICD-10-CM

## 2014-06-13 MED ORDER — SITAGLIPTIN PHOSPHATE 100 MG PO TABS: 100.0000 mg | ORAL_TABLET | Freq: Every day | ORAL | Status: DC

## 2014-06-13 MED ORDER — ATORVASTATIN CALCIUM 40 MG PO TABS: 40.0000 mg | ORAL_TABLET | Freq: Every day | ORAL | Status: DC

## 2014-06-13 NOTE — Progress Notes (Signed)
   Subjective:    Patient ID: Lanyah Spengler, female    DOB: [birth year], 53 y.o.   MRN: [id number]  HPI Pt here c/o pelvic pain, no d/c, no dysuria, no bleeding.  HPI HYPERTENSION  Blood pressure range-not checked  Chest pain- no      Dyspnea- no Lightheadedness- no   Edema- no Other side effects - no   Medication compliance: good Low salt diet- yes  DIABETES  Blood Sugar ranges-high  Polyuria- no New Visual problems- no Hypoglycemic symptoms- no Other side effects-no Medication compliance - good Last eye exam- due Foot exam- today  HYPERLIPIDEMIA  Medication compliance- good  RUQ pain- no  Muscle aches- no Other side effects-no   Review of Systems As above    Objective:   Physical Exam BP 122/70  Pulse 82  Temp(Src) 98 F (36.7 C) (Oral)  Wt 243 lb (110.224 kg)  SpO2 98% General appearance: alert, cooperative, appears stated age and no distress Throat: lips, mucosa, and tongue normal; teeth and gums normal Neck: no adenopathy, supple, symmetrical, trachea midline and thyroid not enlarged, symmetric, no tenderness/mass/nodules Lungs: clear to auscultation bilaterally Heart: S1, S2 normal Abdomen: soft, non-tender; bowel sounds normal; no masses,  no organomegaly Extremities: extremities normal, atraumatic, no cyanosis or edema        Assessment & Plan:  1. Female pelvic pain Hx cysts-- check [place] - US Pelvis Complete; Future - US Transvaginal Non-OB; Future - Basic metabolic panel - Hemoglobin A1c - Lipid panel - Microalbumin / creatinine urine ratio - POCT urinalysis dipstick  2. Type II or unspecified type diabetes mellitus with peripheral circulatory disorders, uncontrolled(250.72) D/c metformin secondary to diarrhea--- start januvia - Basic metabolic panel - Hemoglobin A1c - Hepatic function panel - Lipid panel - Microalbumin / creatinine urine ratio - POCT urinalysis dipstick - sitaGLIPtin (JANUVIA) 100 MG tablet; Take 1 tablet (100 mg  total) by mouth daily.  Dispense: 30 tablet; Refill: 2  3. Other and unspecified hyperlipidemia   - atorvastatin (LIPITOR) 40 MG tablet; Take 1 tablet (40 mg total) by mouth daily.  Dispense: 90 tablet; Refill: 3

## 2014-06-13 NOTE — Progress Notes (Signed)
Pre visit review using our clinic review tool, if applicable. No additional management support is needed unless otherwise documented below in the visit note. 

## 2014-06-13 NOTE — Patient Instructions (Signed)
Pelvic Pain Female pelvic pain can be caused by many different things and start from a variety of places. Pelvic pain refers to pain that is located in the lower half of the abdomen and between your hips. The pain may occur over a short period of time (acute) or may be reoccurring (chronic). The cause of pelvic pain may be related to disorders affecting the female reproductive organs (gynecologic), but it may also be related to the bladder, kidney stones, an intestinal complication, or muscle or skeletal problems. Getting help right away for pelvic pain is important, especially if there has been severe, sharp, or a sudden onset of unusual pain. It is also important to get help right away because some types of pelvic pain can be life threatening.  CAUSES  Below are only some of the causes of pelvic pain. The causes of pelvic pain can be in one of several categories.   Gynecologic.  Pelvic inflammatory disease.  Sexually transmitted infection.  Ovarian cyst or a twisted ovarian ligament (ovarian torsion).  Uterine lining that grows outside the uterus (endometriosis).  Fibroids, cysts, or tumors.  Ovulation.  Pregnancy.  Pregnancy that occurs outside the uterus (ectopic pregnancy).  Miscarriage.  Labor.  Abruption of the placenta or ruptured uterus.  Infection.  Uterine infection (endometritis).  Bladder infection.  Diverticulitis.  Miscarriage related to a uterine infection (septic abortion).  Bladder.  Inflammation of the bladder (cystitis).  Kidney stone(s).  Gastrointestinal.  Constipation.  Diverticulitis.  Neurologic.  Trauma.  Feeling pelvic pain because of mental or emotional causes (psychosomatic).  Cancers of the bowel or pelvis. EVALUATION  Your caregiver will want to take a careful history of your concerns. This includes recent changes in your health, a careful gynecologic history of your periods (menses), and a sexual history. Obtaining your family  history and medical history is also important. Your caregiver may suggest a pelvic exam. A pelvic exam will help identify the location and severity of the pain. It also helps in the evaluation of which organ system may be involved. In order to identify the cause of the pelvic pain and be properly treated, your caregiver may order tests. These tests may include:   A pregnancy test.  Pelvic ultrasonography.  An X-ray exam of the abdomen.  A urinalysis or evaluation of vaginal discharge.  Blood tests. HOME CARE INSTRUCTIONS   Only take over-the-counter or prescription medicines for pain, discomfort, or fever as directed by your caregiver.   Rest as directed by your caregiver.   Eat a balanced diet.   Drink enough fluids to make your urine clear or pale yellow, or as directed.   Avoid sexual intercourse if it causes pain.   Apply warm or cold compresses to the lower abdomen depending on which one helps the pain.   Avoid stressful situations.   Keep a journal of your pelvic pain. Write down when it started, where the pain is located, and if there are things that seem to be associated with the pain, such as food or your menstrual cycle.  Follow up with your caregiver as directed.  SEEK MEDICAL CARE IF:  Your medicine does not help your pain.  You have abnormal vaginal discharge. SEEK IMMEDIATE MEDICAL CARE IF:   You have heavy bleeding from the vagina.   Your pelvic pain increases.   You feel light-headed or faint.   You have chills.   You have pain with urination or blood in your urine.   You have uncontrolled diarrhea   or vomiting.   You have a fever or persistent symptoms for more than 3 days.  You have a fever and your symptoms suddenly get worse.   You are being physically or sexually abused.  MAKE SURE YOU:  Understand these instructions.  Will watch your condition.  Will get help if you are not doing well or get worse. Document Released:  09/09/2004 Document Revised: 02/27/2014 Document Reviewed: 02/02/2012 [employer] Patient Information 2015 [employer]. This information is not intended to replace advice given to you by your health care provider. Make sure you discuss any questions you have with your health care provider.  

## 2014-06-14 ENCOUNTER — Encounter: Payer: Self-pay | Admitting: Family Medicine

## 2014-06-14 LAB — LIPID PANEL
CHOL/HDL RATIO: 4
Cholesterol: 201 mg/dL — ABNORMAL HIGH (ref 0–200)
HDL: 54.3 mg/dL (ref >39.00)
LDL Cholesterol: 127 mg/dL — ABNORMAL HIGH (ref 0–99)
NONHDL: 146.7
Triglycerides: 98 mg/dL (ref 0.0–149.0)
VLDL: 19.6 mg/dL (ref 0.0–40.0)

## 2014-06-14 LAB — HEPATIC FUNCTION PANEL
ALK PHOS: 64 U/L (ref 39–117)
ALT: 27 U/L (ref 0–35)
AST: 20 U/L (ref 0–37)
Albumin: 3.8 g/dL (ref 3.5–5.2)
BILIRUBIN DIRECT: 0.1 mg/dL (ref 0.0–0.3)
TOTAL PROTEIN: 7.1 g/dL (ref 6.0–8.3)
Total Bilirubin: 0.4 mg/dL (ref 0.2–1.2)

## 2014-06-14 LAB — BASIC METABOLIC PANEL
BUN: 10 mg/dL (ref 6–23)
CO2: 25 mEq/L (ref 19–32)
Calcium: 9.5 mg/dL (ref 8.4–10.5)
Chloride: 102 mEq/L (ref 96–112)
Creatinine, Ser: 0.6 mg/dL (ref 0.4–1.2)
GFR: 111.7 mL/min (ref >60.00)
GLUCOSE: 75 mg/dL (ref 70–99)
Potassium: 4.1 mEq/L (ref 3.5–5.1)
SODIUM: 136 meq/L (ref 135–145)

## 2014-06-14 LAB — POCT URINALYSIS DIPSTICK
BILIRUBIN UA: NEGATIVE
Glucose, UA: NEGATIVE
Ketones, UA: NEGATIVE
Leukocytes, UA: NEGATIVE
Nitrite, UA: NEGATIVE
Protein, UA: NEGATIVE
RBC UA: NEGATIVE
Spec Grav, UA: 1.01
Urobilinogen, UA: 0.2
pH, UA: 6

## 2014-06-14 LAB — MICROALBUMIN / CREATININE URINE RATIO
Creatinine,U: 40.5 mg/dL
MICROALB UR: 0.8 mg/dL (ref 0.0–1.9)
MICROALB/CREAT RATIO: 2 mg/g (ref 0.0–30.0)

## 2014-06-14 LAB — HEMOGLOBIN A1C: Hgb A1c MFr Bld: 8.2 % — ABNORMAL HIGH (ref 4.6–6.5)

## 2014-06-15 ENCOUNTER — Ambulatory Visit (HOSPITAL_BASED_OUTPATIENT_CLINIC_OR_DEPARTMENT_OTHER): Payer: 59

## 2014-06-15 ENCOUNTER — Encounter: Payer: Self-pay | Admitting: Family Medicine

## 2014-06-16 ENCOUNTER — Telehealth: Payer: Self-pay | Admitting: Family Medicine

## 2014-06-16 MED ORDER — SAXAGLIPTIN HCL 5 MG PO TABS: 5.0000 mg | ORAL_TABLET | Freq: Every day | ORAL | Status: DC

## 2014-06-16 MED ORDER — GLIMEPIRIDE 2 MG PO TABS: 2.0000 mg | ORAL_TABLET | Freq: Every day | ORAL | Status: DC

## 2014-06-16 NOTE — Telephone Encounter (Signed)
Caller name: Guillermina Relation to pt: Call back number: [phone] Pharmacy: [facility]  Reason for call:  Pt states the the pharmacy is out of the Rx saxagliptin HCl (ONGLYZA) 5 MG TABS tablet and that it costs over $300.  Pt wants to know what to do.

## 2014-06-16 NOTE — Telephone Encounter (Signed)
Pt needs coupon for onglyza and rx 5mg  1 po qd #30  2 refills

## 2014-06-16 NOTE — Telephone Encounter (Signed)
Spoke with patient and she voiced understanding and has agreed to try the amaryl. I made her aware to call with any concerns.  Rx faxed     KP

## 2014-06-16 NOTE — Telephone Encounter (Signed)
amaryl 2 mg 1 po qd  #30  For now--- if she can get a copy of her formulary to [place] we can look at it

## 2014-06-16 NOTE — Telephone Encounter (Signed)
Please advise      KP 

## 2014-06-23 ENCOUNTER — Other Ambulatory Visit: Payer: Self-pay | Admitting: Family Medicine

## 2014-06-27 ENCOUNTER — Telehealth: Payer: Self-pay

## 2014-06-27 ENCOUNTER — Encounter: Payer: Self-pay | Admitting: Family Medicine

## 2014-06-27 MED ORDER — GLUCOSE BLOOD VI STRP
ORAL_STRIP | Status: DC
Start: 2014-06-27 — End: 2015-04-13

## 2014-06-27 NOTE — Telephone Encounter (Signed)
Patient called back.  Advised to check blood sugar.  Call back.

## 2014-06-27 NOTE — Telephone Encounter (Signed)
It may be because her amaryl is dropping her sugar too low--- have her check her sugar---

## 2014-06-27 NOTE — Telephone Encounter (Signed)
Angela Barr [phone]  [name] called to say she thinks she is having a reaction to her new medicine glimepiride (AMARYL) 2 MG tablet, she is having heart papulations, headaches, and very tired.

## 2014-06-27 NOTE — Telephone Encounter (Signed)
Break amaryl in half--- if she is willing to cut out sweet tea and sugar sodas she may be able to get buy with not taking the amaryl.

## 2014-06-27 NOTE — Telephone Encounter (Signed)
Started Amaryl--06/21/14 was taking Metformin until then.  Sunday, 06/25/14 was the first day she felt bad--headache, weak, and felt like blood sugar was dropping.  She ate a piece of peppermint candy and started to feel better.    This morning she began to have similar symptoms--heart pounding, headache, and felt very tired.  She was instructed to check her blood sugar.  BS- 151 fasting.  She had not had breakfast this morning whenever she called at 0932.  After checking her blood sugar, she had breakfast and her symptoms subsided.   Only changes to diet pt mentioned was drinking sweet tea instead of soda.  She states that she drinks a least one glass per day and has been doing so since 06/16/14.    She checks blood sugars once a day and states they are typically 170-181 fasting.    At night her symptoms worsening--increased heart palpitations and sweating.  Pt states that her husband also stated that last Friday (06/23/14) her breathing sound funny during the night and he shook her awake.  Pt states she's not sure if the medication is working for her.   Please advise.

## 2014-06-27 NOTE — Telephone Encounter (Signed)
Dr. Nonda Lou recommendations were reviewed with patient.  She stated understanding and agreed.  No further needs voiced.  She was encouraged to call back with further questions or concerns.

## 2014-06-27 NOTE — Telephone Encounter (Signed)
Tried calling patient for details.  Unable to reach patient.  Left a message for call back.    Please advise.

## 2014-06-28 ENCOUNTER — Telehealth: Payer: Self-pay

## 2014-06-28 DIAGNOSIS — E119 Type 2 diabetes mellitus without complications: Secondary | ICD-10-CM

## 2014-06-28 NOTE — Telephone Encounter (Signed)
Yes she should but she needs to eat 3 meals a day--- pasta, bread and watermelon --- will all elevated BS----- she may need nutrition referral

## 2014-06-28 NOTE — Telephone Encounter (Signed)
Angela Barr [phone]  [name] called and said she had talked with triage RN yesterday and they had advised her to split one of her pills in half, she took her Fasting Blood Sugar this morning and it was 219, so she is not sure if she should go back to a whole pill or continue with the half.

## 2014-06-28 NOTE — Telephone Encounter (Signed)
Discussed with the patient who agreed to the nutrition referral and she has agreed to take the full pill per Dr.Lowne and check BS daily.       KP

## 2014-06-28 NOTE — Telephone Encounter (Signed)
Yesterday the patient called and c/o low BS and was instructed to cut her pill in half. Today her fasting BS was elevated and she wants to know what to do.  I discussed further with the patient just to clarify her meals and she stated she does not always eat during the day and was taking a whole pill, which is turn could have made the BS drop to low, she took a whole pill yesterday before she was instructed to cut the Amaryl in half, however when she ate dinner at 1:96 (Spaghetti, garlic bread, watermelon, water and popcorn) she woke up to a fating BS of 219, she is only checking the blood sugar once a day unless she is feeling bad and wants to know with a BS that high should she take the full Amaryl or not.    Please advise      KP

## 2014-07-13 ENCOUNTER — Telehealth: Payer: Self-pay | Admitting: *Deleted

## 2014-07-13 NOTE — Telephone Encounter (Signed)
Received prior authorization for Januvia 100mg  via fax from [facility] [place].  Printed [employer] form on-line.  Filled out as much as possible, then placed in folder for Dr. Etter Sjogren to complete.//AB/CMA

## 2014-07-17 ENCOUNTER — Other Ambulatory Visit: Payer: Self-pay | Admitting: Family Medicine

## 2014-07-25 NOTE — Telephone Encounter (Signed)
Received completed prior authorization request form from Dr. Etter Sjogren.   Form faxed to [employer] at [phone]).  Awaiting response.//AB/CMA

## 2014-07-27 ENCOUNTER — Other Ambulatory Visit: Payer: Self-pay | Admitting: *Deleted

## 2014-07-27 MED ORDER — ALOGLIPTIN BENZOATE 25 MG PO TABS: 25.0000 mg | ORAL_TABLET | Freq: Every day | ORAL | Status: DC

## 2014-07-27 NOTE — Telephone Encounter (Signed)
nesina 25mg  #30  1 po qd , 2 refills

## 2014-07-27 NOTE — Telephone Encounter (Signed)
Prior authorization for Januvia was denied. Pt's plan does cover Onglyza, Tradjenta, and Nesina. Please advise to an alternative. JG//CMA

## 2014-07-27 NOTE — Telephone Encounter (Signed)
Nesina 25 mg e-scribed to pharmacy. JG//CMA

## 2014-07-27 NOTE — Telephone Encounter (Signed)
Januvia PA denied. Per Dr Etter Sjogren, med is to be switched to Nesina 25mg . Nesina e-scribed to pharmacy. JG//CMA

## 2014-09-20 ENCOUNTER — Other Ambulatory Visit: Payer: Self-pay | Admitting: Family Medicine

## 2014-12-25 ENCOUNTER — Encounter: Payer: Self-pay | Admitting: Family Medicine

## 2014-12-25 ENCOUNTER — Ambulatory Visit (INDEPENDENT_AMBULATORY_CARE_PROVIDER_SITE_OTHER): Payer: 59 | Admitting: Family Medicine

## 2014-12-25 VITALS — BP 114/78 | HR 98 | Temp 98.6°F | Wt 242.6 lb

## 2014-12-25 DIAGNOSIS — E1165 Type 2 diabetes mellitus with hyperglycemia: Principal | ICD-10-CM

## 2014-12-25 DIAGNOSIS — IMO0002 Reserved for concepts with insufficient information to code with codable children: Secondary | ICD-10-CM

## 2014-12-25 DIAGNOSIS — E1159 Type 2 diabetes mellitus with other circulatory complications: Secondary | ICD-10-CM

## 2014-12-25 DIAGNOSIS — I10 Essential (primary) hypertension: Secondary | ICD-10-CM

## 2014-12-25 DIAGNOSIS — E785 Hyperlipidemia, unspecified: Secondary | ICD-10-CM

## 2014-12-25 DIAGNOSIS — J0111 Acute recurrent frontal sinusitis: Secondary | ICD-10-CM

## 2014-12-25 DIAGNOSIS — N951 Menopausal and female climacteric states: Secondary | ICD-10-CM

## 2014-12-25 DIAGNOSIS — N912 Amenorrhea, unspecified: Secondary | ICD-10-CM

## 2014-12-25 DIAGNOSIS — E1151 Type 2 diabetes mellitus with diabetic peripheral angiopathy without gangrene: Secondary | ICD-10-CM

## 2014-12-25 LAB — LIPID PANEL
CHOLESTEROL: 176 mg/dL (ref 0–200)
HDL: 47.1 mg/dL (ref >39.00)
LDL Cholesterol: 114 mg/dL — ABNORMAL HIGH (ref 0–99)
NonHDL: 128.9
Total CHOL/HDL Ratio: 4
Triglycerides: 76 mg/dL (ref 0.0–149.0)
VLDL: 15.2 mg/dL (ref 0.0–40.0)

## 2014-12-25 LAB — CBC WITH DIFFERENTIAL/PLATELET
Basophils Absolute: 0 10*3/uL (ref 0.0–0.1)
Basophils Relative: 0.6 % (ref 0.0–3.0)
EOS PCT: 2.2 % (ref 0.0–5.0)
Eosinophils Absolute: 0.1 10*3/uL (ref 0.0–0.7)
HEMATOCRIT: 41 % (ref 36.0–46.0)
Hemoglobin: 13.5 g/dL (ref 12.0–15.0)
LYMPHS ABS: 2.1 10*3/uL (ref 0.7–4.0)
Lymphocytes Relative: 35.3 % (ref 12.0–46.0)
MCHC: 32.9 g/dL (ref 30.0–36.0)
MCV: 78.2 fl (ref 78.0–100.0)
Monocytes Absolute: 0.5 10*3/uL (ref 0.1–1.0)
Monocytes Relative: 8.1 % (ref 3.0–12.0)
Neutro Abs: 3.2 10*3/uL (ref 1.4–7.7)
Neutrophils Relative %: 53.8 % (ref 43.0–77.0)
PLATELETS: 217 10*3/uL (ref 150.0–400.0)
RBC: 5.24 Mil/uL — ABNORMAL HIGH (ref 3.87–5.11)
RDW: 14.6 % (ref 11.5–15.5)
WBC: 5.9 10*3/uL (ref 4.0–10.5)

## 2014-12-25 LAB — POCT URINALYSIS DIPSTICK
Bilirubin, UA: NEGATIVE
Blood, UA: NEGATIVE
GLUCOSE UA: NEGATIVE
Ketones, UA: NEGATIVE
LEUKOCYTES UA: NEGATIVE
Nitrite, UA: NEGATIVE
PH UA: 6
Spec Grav, UA: 1.025
Urobilinogen, UA: 0.2

## 2014-12-25 LAB — MICROALBUMIN / CREATININE URINE RATIO
Creatinine,U: 271.6 mg/dL
Microalb Creat Ratio: 0.7 mg/g (ref 0.0–30.0)
Microalb, Ur: 2 mg/dL — ABNORMAL HIGH (ref 0.0–1.9)

## 2014-12-25 LAB — HEPATIC FUNCTION PANEL
ALBUMIN: 4 g/dL (ref 3.5–5.2)
ALT: 57 U/L — ABNORMAL HIGH (ref 0–35)
AST: 28 U/L (ref 0–37)
Alkaline Phosphatase: 102 U/L (ref 39–117)
Bilirubin, Direct: 0.1 mg/dL (ref 0.0–0.3)
TOTAL PROTEIN: 7.5 g/dL (ref 6.0–8.3)
Total Bilirubin: 0.4 mg/dL (ref 0.2–1.2)

## 2014-12-25 LAB — HEMOGLOBIN A1C: Hgb A1c MFr Bld: 11.1 % — ABNORMAL HIGH (ref 4.6–6.5)

## 2014-12-25 LAB — BASIC METABOLIC PANEL
BUN: 8 mg/dL (ref 6–23)
CHLORIDE: 100 meq/L (ref 96–112)
CO2: 28 mEq/L (ref 19–32)
Calcium: 9.7 mg/dL (ref 8.4–10.5)
Creatinine, Ser: 0.62 mg/dL (ref 0.40–1.20)
GFR: 129.87 mL/min (ref >60.00)
GLUCOSE: 235 mg/dL — AB (ref 70–99)
POTASSIUM: 3.7 meq/L (ref 3.5–5.1)
Sodium: 135 mEq/L (ref 135–145)

## 2014-12-25 MED ORDER — GLIMEPIRIDE 2 MG PO TABS: ORAL_TABLET | ORAL | Status: DC

## 2014-12-25 MED ORDER — FLUTICASONE PROPIONATE 50 MCG/ACT NA SUSP: 2.0000 | Freq: Every day | NASAL | Status: DC

## 2014-12-25 MED ORDER — AMOXICILLIN-POT CLAVULANATE 875-125 MG PO TABS: 1.0000 | ORAL_TABLET | Freq: Two times a day (BID) | ORAL | Status: DC

## 2014-12-25 NOTE — Progress Notes (Signed)
Pre visit review using our clinic review tool, if applicable. No additional management support is needed unless otherwise documented below in the visit note. 

## 2014-12-25 NOTE — Patient Instructions (Signed)
Diabetes and Standards of Medical Care Diabetes is complicated. You may find that your diabetes team includes a dietitian, nurse, diabetes educator, eye doctor, and more. To help everyone know what is going on and to help you get the care you deserve, the following schedule of care was developed to help keep you on track. Below are the tests, exams, vaccines, medicines, education, and plans you will need. HbA1c test This test shows how well you have controlled your glucose over the past 2-3 months. It is used to see if your diabetes management plan needs to be adjusted.   It is performed at least 2 times a year if you are meeting treatment goals.  It is performed 4 times a year if therapy has changed or if you are not meeting treatment goals. Blood pressure test  This test is performed at every routine medical visit. The goal is less than 140/90 mm Hg for most people, but 130/80 mm Hg in some cases. Ask your health care provider about your goal. Dental exam  Follow up with the dentist regularly. Eye exam  If you are diagnosed with type 1 diabetes as a child, get an exam upon reaching the age of 37 years or older and have had diabetes for 3-5 years. Yearly eye exams are recommended after that initial eye exam.  If you are diagnosed with type 1 diabetes as an adult, get an exam within 5 years of diagnosis and then yearly.  If you are diagnosed with type 2 diabetes, get an exam as soon as possible after the diagnosis and then yearly. Foot care exam  Visual foot exams are performed at every routine medical visit. The exams check for cuts, injuries, or other problems with the feet.  A comprehensive foot exam should be done yearly. This includes visual inspection as well as assessing foot pulses and testing for loss of sensation.  Check your feet nightly for cuts, injuries, or other problems with your feet. Tell your health care provider if anything is not healing. Kidney function test (urine  microalbumin)  This test is performed once a year.  Type 1 diabetes: The first test is performed 5 years after diagnosis.  Type 2 diabetes: The first test is performed at the time of diagnosis.  A serum creatinine and estimated glomerular filtration rate (eGFR) test is done once a year to assess the level of chronic kidney disease (CKD), if present. Lipid profile (cholesterol, HDL, LDL, triglycerides)  Performed every 5 years for most people.  The goal for LDL is less than 100 mg/dL. If you are at high risk, the goal is less than 70 mg/dL.  The goal for HDL is 40 mg/dL-50 mg/dL for men and 50 mg/dL-60 mg/dL for women. An HDL cholesterol of 60 mg/dL or higher gives some protection against heart disease.  The goal for triglycerides is less than 150 mg/dL. Influenza vaccine, pneumococcal vaccine, and hepatitis B vaccine  The influenza vaccine is recommended yearly.  It is recommended that people with diabetes who are over 24 years old get the pneumonia vaccine. In some cases, two separate shots may be given. Ask your health care provider if your pneumonia vaccination is up to date.  The hepatitis B vaccine is also recommended for adults with diabetes. Diabetes self-management education  Education is recommended at diagnosis and ongoing as needed. Treatment plan  Your treatment plan is reviewed at every medical visit. Document Released: 08/10/2009 Document Revised: 02/27/2014 Document Reviewed: 03/15/2013 [facility] Patient Information 2015 [place],  [employer]. This information is not intended to replace advice given to you by your health care provider. Make sure you discuss any questions you have with your health care provider.

## 2014-12-25 NOTE — Progress Notes (Signed)
Subjective:    Patient ID: Angela Barr, female    DOB: October 02, 1961, 54 y.o.   MRN: [id number]  HPI  Patient here for f/u dm, htn, cholesterol and c/o sinus congestion since last Tuesday.  She has been taking mucinex.    Past Medical History  Diagnosis Date  . Diabetes mellitus 2006  . Hypertension 2012  . Colon polyps 2008    [name]  . Hyperlipemia 2012  . Obesity    Current Outpatient Prescriptions  Medication Sig Dispense Refill  . atorvastatin (LIPITOR) 40 MG tablet Take 1 tablet (40 mg total) by mouth daily. 90 tablet 3  . Bee Pollen 550 MG CAPS Take by mouth.    Marland Kitchen glimepiride (AMARYL) 2 MG tablet 2 po qd 60 tablet 1  . glucose blood (FREESTYLE LITE) test strip Freestyle Lite test strip check blood sugar twice daily 100 each 12  . lisinopril (PRINIVIL,ZESTRIL) 5 MG tablet TAKE 1 TABLET BY MOUTH EVERY DAY 30 tablet 5  . Alogliptin Benzoate (NESINA) 25 MG TABS Take 25 mg by mouth daily. (Patient not taking: Reported on 12/25/2014) 30 tablet 2  . amoxicillin-clavulanate (AUGMENTIN) 875-125 MG per tablet Take 1 tablet by mouth 2 (two) times daily. 20 tablet 0  . fluticasone (FLONASE) 50 MCG/ACT nasal spray Place 2 sprays into both nostrils daily. 16 g 6   No current facility-administered medications for this visit.     Review of Systems  Constitutional: Positive for chills. Negative for fever.  HENT: Positive for congestion, postnasal drip, rhinorrhea, sinus pressure, sore throat, trouble swallowing and voice change. Negative for ear discharge, ear pain and hearing loss.   Respiratory: Positive for cough, chest tightness, shortness of breath and wheezing.   Cardiovascular: Negative for chest pain, palpitations and leg swelling.  Endocrine: Negative for cold intolerance, heat intolerance, polydipsia, polyphagia and polyuria.  Allergic/Immunologic: Negative for environmental allergies.  Psychiatric/Behavioral: Negative for suicidal ideas, sleep disturbance,  self-injury, dysphoric mood and decreased concentration. The patient is not nervous/anxious.        Objective:    Physical Exam  Constitutional: She is oriented to person, place, and time. She appears well-developed and well-nourished.  HENT:  Right Ear: External ear normal.  Left Ear: External ear normal.  Nose: Rhinorrhea and sinus tenderness present. Right sinus exhibits maxillary sinus tenderness and frontal sinus tenderness. Left sinus exhibits maxillary sinus tenderness and frontal sinus tenderness.  Mouth/Throat: Oropharynx is clear and moist.  + PND + errythema  Eyes: Conjunctivae are normal. Right eye exhibits no discharge. Left eye exhibits no discharge.  Cardiovascular: Normal rate, regular rhythm and normal heart sounds.   No murmur heard. Pulmonary/Chest: Effort normal and breath sounds normal. No respiratory distress. She has no wheezes. She has no rales. She exhibits no tenderness.  Musculoskeletal: She exhibits no edema.  Lymphadenopathy:    She has cervical adenopathy.  Neurological: She is alert and oriented to person, place, and time.    BP 114/78 mmHg  Pulse 98  Temp(Src) 98.6 F (37 C) (Oral)  Wt 242 lb 9.6 oz (110.043 kg)  SpO2 98% Wt Readings from Last 3 Encounters:  12/25/14 242 lb 9.6 oz (110.043 kg)  06/13/14 243 lb (110.224 kg)  06/30/13 230 lb (104.327 kg)     Lab Results  Component Value Date   WBC 5.2 06/29/2013   HGB 12.9 06/29/2013   HCT 39.2 06/29/2013   PLT 215.0 06/29/2013   GLUCOSE 75 06/13/2014   CHOL 201* 06/13/2014   TRIG  98.0 06/13/2014   HDL 54.30 06/13/2014   LDLCALC 127* 06/13/2014   ALT 27 06/13/2014   AST 20 06/13/2014   NA 136 06/13/2014   K 4.1 06/13/2014   CL 102 06/13/2014   CREATININE 0.6 06/13/2014   BUN 10 06/13/2014   CO2 25 06/13/2014   TSH 0.69 06/29/2013   HGBA1C 8.2* 06/13/2014   MICROALBUR 0.8 06/13/2014    Dg Hip Complete Left  04/21/2014   CLINICAL DATA:  Leg pain. The patient complains of a  kink in the left upper leg since Sunday. Difficulty rolling the toes. No injury.  EXAM: LEFT HIP - COMPLETE 2+ VIEW  COMPARISON:  None.  FINDINGS: There is no evidence of hip fracture or dislocation. There is no evidence of arthropathy or other focal bone abnormality.  IMPRESSION: Negative.   Electronically Signed   By: Lucienne Capers M.D.   On: 04/21/2014 02:15   US Venous Img Lower Unilateral Left  04/21/2014   CLINICAL DATA:  Left groin pain for 3 days, increasing in intensity.  EXAM: Left LOWER EXTREMITY VENOUS DOPPLER ULTRASOUND  TECHNIQUE: Gray-scale sonography with graded compression, as well as color Doppler and duplex ultrasound were performed to evaluate the lower extremity deep venous systems from the level of the common femoral vein and including the common femoral, femoral, profunda femoral, popliteal and calf veins including the posterior tibial, peroneal and gastrocnemius veins when visible. The superficial great saphenous vein was also interrogated. Spectral Doppler was utilized to evaluate flow at rest and with distal augmentation maneuvers in the common femoral, femoral and popliteal veins.  COMPARISON:  None.  FINDINGS: Common Femoral Vein: No evidence of thrombus. Normal compressibility, respiratory phasicity and response to augmentation.  Saphenofemoral Junction: No evidence of thrombus. Normal compressibility and flow on color Doppler imaging.  Profunda Femoral Vein: No evidence of thrombus. Normal compressibility and flow on color Doppler imaging.  Femoral Vein: No evidence of thrombus. Normal compressibility, respiratory phasicity and response to augmentation.  Popliteal Vein: No evidence of thrombus. Normal compressibility, respiratory phasicity and response to augmentation.  Calf Veins: No evidence of thrombus. Normal compressibility and flow on color Doppler imaging.  Superficial Great Saphenous Vein: No evidence of thrombus. Normal compressibility and flow on color Doppler imaging.   Venous Reflux:  None.  Other Findings:  None.  IMPRESSION: No evidence of deep venous thrombosis.   Electronically Signed   By: Lucienne Capers M.D.   On: 04/21/2014 02:14       Assessment & Plan:   Problem List Items Addressed This Visit    None    Visit Diagnoses    DM (diabetes mellitus) type II uncontrolled, periph vascular disorder    -  Primary    Relevant Medications    glimepiride (AMARYL) tablet    Other Relevant Orders    Basic metabolic panel    Hemoglobin A1c    Microalbumin / creatinine urine ratio    POCT urinalysis dipstick    Essential hypertension        Relevant Orders    CBC with Differential/Platelet    Hyperlipidemia LDL goal <100        Relevant Orders    Hepatic function panel    Lipid panel    Amenorrhea        Hot flash, menopausal        Relevant Orders    Estradiol    FSH    LH    Progesterone    Thyroid Panel With  TSH    Acute recurrent frontal sinusitis        Relevant Medications    AUGMENTIN 875-125 MG PO TABS    fluticasone (FLONASE) nasal spray        Garnet Koyanagi, DO

## 2014-12-26 LAB — LUTEINIZING HORMONE: LH: 20.99 m[IU]/mL

## 2014-12-26 LAB — THYROID PANEL WITH TSH
Free Thyroxine Index: 2.1 (ref 1.4–3.8)
T3 Uptake: 27 % (ref 22–35)
T4 TOTAL: 7.6 ug/dL (ref 4.5–12.0)
TSH: 0.384 u[IU]/mL (ref 0.350–4.500)

## 2014-12-26 LAB — FOLLICLE STIMULATING HORMONE: FSH: 25.1 m[IU]/mL

## 2014-12-26 LAB — ESTRADIOL: Estradiol: 18.6 pg/mL

## 2014-12-26 LAB — PROGESTERONE: Progesterone: 0.4 ng/mL

## 2014-12-28 ENCOUNTER — Telehealth: Payer: Self-pay | Admitting: *Deleted

## 2014-12-28 DIAGNOSIS — R809 Proteinuria, unspecified: Secondary | ICD-10-CM

## 2014-12-28 DIAGNOSIS — E785 Hyperlipidemia, unspecified: Secondary | ICD-10-CM

## 2014-12-28 DIAGNOSIS — E1165 Type 2 diabetes mellitus with hyperglycemia: Secondary | ICD-10-CM

## 2014-12-28 DIAGNOSIS — IMO0002 Reserved for concepts with insufficient information to code with codable children: Secondary | ICD-10-CM

## 2014-12-28 DIAGNOSIS — E118 Type 2 diabetes mellitus with unspecified complications: Principal | ICD-10-CM

## 2014-12-28 MED ORDER — SIMVASTATIN 20 MG PO TABS: 20.0000 mg | ORAL_TABLET | Freq: Every day | ORAL | Status: DC

## 2014-12-28 MED ORDER — METFORMIN HCL ER 500 MG PO TB24: ORAL_TABLET | ORAL | Status: DC

## 2014-12-28 NOTE — Telephone Encounter (Signed)
-----   Message from Rosalita Chessman, DO sent at 12/27/2014  8:30 PM EST ----- microalbumin---- need to get 24 h urine for protein DM not controlled---- add metformin xr 500 mg  2 po qpm, 2 refills--- other option is endo referral Cholesterol--- LDL goal < 70,  HDL >40,  TG < 150.  Diet and exercise will increase HDL and decrease LDL and TG.  Fish,  Fish Oil, Flaxseed oil will also help increase the HDL and decrease Triglycerides.   Recheck labs in 3 months----lipid, hep, bmp, hgba1c---dm II uncontrolled, hyperlipidemia Start zocor 20 mg #30  1 po qhs, 2 refills.

## 2014-12-28 NOTE — Telephone Encounter (Signed)
Called and spoke with the pt and informed her of recent lab results and note.  Pt verbalized understanding and agreed.  Pt stated that she would try the meds first.   She will come and pick up the container for the 24 hour urine . Both meds were sent to the pharmacy by e-script.  Pt scheduled a lab appt for (Friday 03-30-15 @ 8:00am) to recheck labs.  Future labs ordered and sent.//AB/CMA

## 2014-12-29 ENCOUNTER — Telehealth: Payer: Self-pay | Admitting: Family Medicine

## 2014-12-29 NOTE — Telephone Encounter (Signed)
Pt notified and made aware.  She was encouraged not to skip meals and to check blood sugars and record.  She was advised that If she notices that her blood sugars are dropping too low, to call back for further instructions. She stated understanding and agreed.

## 2014-12-29 NOTE — Telephone Encounter (Signed)
Can increase to 4 mg bid of amaryl

## 2014-12-29 NOTE — Telephone Encounter (Signed)
Noted.  Message routed to Dr. Etter Sjogren for review.

## 2014-12-29 NOTE — Telephone Encounter (Signed)
Patient Name: CORLIS ANGELICA DOB: 08-Dec-1960 Initial Comment Caller states she took her BS this morning 262. With a fasting, she took her Medforumn last night. Concerned if she should eat this morning. Nurse Assessment Nurse: Marcelline Deist, RN, Lynda Date/Time (Eastern Time): 12/29/2014 9:27:56 AM Confirm and document reason for call. If symptomatic, describe symptoms. ---Caller states she took her BS this morning 262, fasting. No symptoms. She took her Metformin 1000 mg last night. Concerned if she should eat this morning. A1C is at 11.1 from 8.3. Takes Glimepiride 4 mg in am. The lab called her, spilling protein in urine. Supposed to come in today to pick up kit for 24 hr. urine. was seen on Monday by her Dr. Has the patient traveled out of the country within the last 30 days? ---Not Applicable Does the patient require triage? ---Yes Related visit to physician within the last 2 weeks? ---Yes Does the PT have any chronic conditions? (i.e. diabetes, asthma, etc.) ---Yes List chronic conditions. ---diabetes Did the patient indicate they were pregnant? ---No Guidelines Guideline Title Affirmed Question Affirmed Notes Diabetes - High Blood Sugar [1] Blood glucose > 240 mg/dl (13 mmol/ l) AND [2] does not use insulin (e.g., not insulin-dependent; most type 2 diabetics) (all triage questions negative) Final Disposition [place], RN, Kermit Balo Comments Caller advised that eating breakfast would be recommended just so she doesn't suddenly drop her blood sugar when the Metformin kicks in. Since she has just started the rx, it may take some time for her body to adjust & for her blood sugar to start coming down. Currently not having symptoms. Advised to continue to monitor her blood sugar, to keep a log & watch for any symptoms.

## 2015-01-02 ENCOUNTER — Encounter: Payer: Self-pay | Admitting: Family Medicine

## 2015-01-02 ENCOUNTER — Other Ambulatory Visit: Payer: Self-pay

## 2015-01-02 ENCOUNTER — Other Ambulatory Visit (INDEPENDENT_AMBULATORY_CARE_PROVIDER_SITE_OTHER): Payer: 59

## 2015-01-02 DIAGNOSIS — E118 Type 2 diabetes mellitus with unspecified complications: Secondary | ICD-10-CM

## 2015-01-02 LAB — PROTEIN, URINE, 24 HOUR
Protein, 24H Urine: 126 mg/d (ref <150)
Protein, Urine: 7 mg/dL (ref 5–24)

## 2015-01-02 MED ORDER — CANAGLIFLOZIN 100 MG PO TABS: 100.0000 mg | ORAL_TABLET | Freq: Every day | ORAL | Status: DC

## 2015-01-02 NOTE — Telephone Encounter (Signed)
invokanna 100 mg #30  1 po qd , 2 refills Recheck labs 3 months

## 2015-01-03 ENCOUNTER — Encounter: Payer: Self-pay | Admitting: Family Medicine

## 2015-01-03 DIAGNOSIS — IMO0002 Reserved for concepts with insufficient information to code with codable children: Secondary | ICD-10-CM

## 2015-01-03 DIAGNOSIS — E1165 Type 2 diabetes mellitus with hyperglycemia: Secondary | ICD-10-CM

## 2015-01-04 NOTE — Telephone Encounter (Signed)
Referral placed.

## 2015-01-16 ENCOUNTER — Other Ambulatory Visit: Payer: Self-pay | Admitting: Family Medicine

## 2015-01-17 ENCOUNTER — Encounter: Payer: Self-pay | Admitting: Family Medicine

## 2015-01-18 NOTE — Telephone Encounter (Signed)
Called patient on preferred number and left message to return call.  eal

## 2015-01-18 NOTE — Telephone Encounter (Signed)
If she is having chest pains , she needs ov or ER --- would  You call her and get more info?

## 2015-01-19 ENCOUNTER — Ambulatory Visit: Payer: 59 | Admitting: Internal Medicine

## 2015-01-22 ENCOUNTER — Telehealth: Payer: Self-pay | Admitting: *Deleted

## 2015-01-22 ENCOUNTER — Encounter: Payer: Self-pay | Admitting: Family Medicine

## 2015-01-22 NOTE — Telephone Encounter (Signed)
Called patient regarding MyChart message stating:    Dr Etter Sjogren I am having a few issues today concerning my blood sugar. I started having slight chest twitches early this morning aroun 5:30am. And I had a headache. I checked my blood sugar and it was 158. I ate breakfast around 8 am. I had scrambled eggs and sausage. I then had some chickfila nuggets around 10:30 am. I began feeling bad with headaches and light headed. I ate again for lunch a leg quarter and mashed potatoes and greens. I became dizzy again and checked my blood sugar 30 minutes later and it was only 132. I took my amaryl at 8 with breakfast. 4mg . And I took 4mg  at 7pm last night along with the 1000mg  of metformin. Please advise.   Patient reports CBG's in the 170's to 180s in the morning.  She is concerned that her diabetes is not well controlled and she is at risk for organ damage.  She denies any symptoms associated with high blood sugars.  She has scheduled an appointment with the endocrinologist but it is not until 02/08/15.  She has been taking the Amaryl and Metformin as scheduled.  She has not started the Invokana because she was waiting for approval from the endocrinologist.   In regards to the chest twitching: patient reports that it is like a "zap" that she feels around 3 times in the mornings before she has eaten.  The pain is not sustained, it does not radiate, denies shortness of breath, dizziness, or numbness.  She feels that it is related to hunger or low blood sugar as it resolves with eating.  She does not feel like it is cardiac and would not like to be seen for this issue.    Advised patient to take Invokana as ordered and monitor blood glucose frequently.  Patient feels that his is too much medication to take daily.  Verified with Dr. Etter Sjogren who stated that she should continue regular medications and add Invokana.  Patient stated understanding and said she will try it and will monitor blood glucose closely.

## 2015-01-23 MED ORDER — GLIMEPIRIDE 4 MG PO TABS: 4.0000 mg | ORAL_TABLET | Freq: Two times a day (BID) | ORAL | Status: DC

## 2015-01-23 NOTE — Telephone Encounter (Signed)
Rudene Anda, RN at 12/29/2014 2:47 PM     Status: Signed       Expand All Collapse All   Pt notified and made aware. She was encouraged not to skip meals and to check blood sugars and record. She was advised that If she notices that her blood sugars are dropping too low, to call back for further instructions. She stated understanding and agreed.             Rosalita Chessman, DO at 12/29/2014 1:18 PM     Status: Signed       Expand All Collapse All   Can increase to 4 mg bid of amaryl

## 2015-02-08 ENCOUNTER — Encounter: Payer: Self-pay | Admitting: Internal Medicine

## 2015-02-08 ENCOUNTER — Ambulatory Visit (INDEPENDENT_AMBULATORY_CARE_PROVIDER_SITE_OTHER): Payer: 59 | Admitting: Internal Medicine

## 2015-02-08 VITALS — BP 118/70 | HR 83 | Temp 97.9°F | Resp 12 | Ht 64.5 in | Wt 242.4 lb

## 2015-02-08 DIAGNOSIS — E1165 Type 2 diabetes mellitus with hyperglycemia: Secondary | ICD-10-CM

## 2015-02-08 DIAGNOSIS — IMO0002 Reserved for concepts with insufficient information to code with codable children: Secondary | ICD-10-CM

## 2015-02-08 NOTE — Patient Instructions (Addendum)
Please continue: - Metformin XR 1000 mg with dinner - Amaryl 4 mg 2x a day - Invokana 100 mg daily in am  Please let me know if you want me to refer you to nutrition.  Please let me know if the sugars are consistently <80 or >200.  Please return in 1 month with your sugar log.   PATIENT INSTRUCTIONS FOR TYPE 2 DIABETES:  **Please join MyChart!** - see attached instructions about how to join if you have not done so already.  DIET AND EXERCISE Diet and exercise is an important part of diabetic treatment.  We recommended aerobic exercise in the form of brisk walking (working between 40-60% of maximal aerobic capacity, similar to brisk walking) for 150 minutes per week (such as 30 minutes five days per week) along with 3 times per week performing 'resistance' training (using various gauge rubber tubes with handles) 5-10 exercises involving the major muscle groups (upper body, lower body and core) performing 10-15 repetitions (or near fatigue) each exercise. Start at half the above goal but build slowly to reach the above goals. If limited by weight, joint pain, or disability, we recommend daily walking in a swimming pool with water up to waist to reduce pressure from joints while allow for adequate exercise.    BLOOD GLUCOSES Monitoring your blood glucoses is important for continued management of your diabetes. Please check your blood glucoses 2-4 times a day: fasting, before meals and at bedtime (you can rotate these measurements - e.g. one day check before the 3 meals, the next day check before 2 of the meals and before bedtime, etc.).   HYPOGLYCEMIA (low blood sugar) Hypoglycemia is usually a reaction to not eating, exercising, or taking too much insulin/ other diabetes drugs.  Symptoms include tremors, sweating, hunger, confusion, headache, etc. Treat IMMEDIATELY with 15 grams of Carbs: . 4 glucose tablets .  cup regular juice/soda . 2 tablespoons raisins . 4 teaspoons sugar . 1  tablespoon honey Recheck blood glucose in 15 mins and repeat above if still symptomatic/blood glucose <100.  RECOMMENDATIONS TO REDUCE YOUR RISK OF DIABETIC COMPLICATIONS: * Take your prescribed MEDICATION(S) * Follow a DIABETIC diet: Complex carbs, fiber rich foods, (monounsaturated and polyunsaturated) fats * AVOID saturated/trans fats, high fat foods, >2,300 mg salt per day. * EXERCISE at least 5 times a week for 30 minutes or preferably daily.  * DO NOT SMOKE OR DRINK more than 1 drink a day. * Check your FEET every day. Do not wear tightfitting shoes. Contact us if you develop an ulcer * See your EYE doctor once a year or more if needed * Get a FLU shot once a year * Get a PNEUMONIA vaccine once before and once after age 71 years  GOALS:  * Your Hemoglobin A1c of <7%  * fasting sugars need to be <130 * after meals sugars need to be <180 (2h after you start eating) * Your Systolic BP should be 638 or lower  * Your Diastolic BP should be 80 or lower  * Your HDL (Good Cholesterol) should be 40 or higher  * Your LDL (Bad Cholesterol) should be 100 or lower. * Your Triglycerides should be 150 or lower  * Your Urine microalbumin (kidney function) should be <30 * Your Body Mass Index should be 25 or lower    Please consider the following ways to cut down carbs and fat and increase fiber and micronutrients in your diet: - substitute whole grain for white bread or pasta -  substitute brown rice for white rice - substitute 90-calorie flat bread pieces for slices of bread when possible - substitute sweet potatoes or yams for white potatoes - substitute humus for margarine - substitute tofu for cheese when possible - substitute almond or rice milk for regular milk (would not drink soy milk daily due to concern for soy estrogen influence on breast cancer risk) - substitute dark chocolate for other sweets when possible - substitute water - can add lemon or orange slices for taste - for diet  sodas (artificial sweeteners will trick your body that you can eat sweets without getting calories and will lead you to overeating and weight gain in the long run) - do not skip breakfast or other meals (this will slow down the metabolism and will result in more weight gain over time)  - can try smoothies made from fruit and almond/rice milk in am instead of regular breakfast - can also try old-fashioned (not instant) oatmeal made with almond/rice milk in am - order the dressing on the side when eating salad at a restaurant (pour less than half of the dressing on the salad) - eat as little meat as possible - can try juicing, but should not forget that juicing will get rid of the fiber, so would alternate with eating raw veg./fruits or drinking smoothies - use as little oil as possible, even when using olive oil - can dress a salad with a mix of balsamic vinegar and lemon juice, for e.g. - use agave nectar, stevia sugar, or regular sugar rather than artificial sweateners - steam or broil/roast veggies  - snack on veggies/fruit/nuts (unsalted, preferably) when possible, rather than processed foods - reduce or eliminate aspartame in diet (it is in diet sodas, chewing gum, etc) Read the labels!  Try to read Dr. Janene Harvey book: "Program for Reversing Diabetes" for other ideas for healthy eating.

## 2015-02-08 NOTE — Progress Notes (Signed)
Patient ID: Angela Barr, female   DOB: 1961-03-22, 54 y.o.   MRN: [id number]  HPI: Angela Barr is a 54 y.o.-year-old female, referred by her PCP, Dr. Etter Sjogren, for management of DM2, dx in 2007, non-insulin-dependent, uncontrolled, without complications.  Last hemoglobin A1c was: Lab Results  Component Value Date   HGBA1C 11.1* 12/25/2014   HGBA1C 8.2* 06/13/2014   HGBA1C 7.6* 06/29/2013   Pt is on a regimen of: - Metformin XR 1000 mg with dinner. She had diarrhea with the regular metformin. - Amaryl 4 mg 2x a day - Invokana 100 mg daily - added 01/22/2015. No yeast inf.   Pt checks her sugars 1x a day and they are: - am: 120-150s (170-180's before Invokana) - 2h after b'fast: n/c - before lunch: n/c - 2h after lunch: n/c - before dinner: n/c - 2h after dinner: n/c - bedtime: n/c - nighttime: n/c No lows. Lowest sugar was 120s; she has hypoglycemia awareness at 110.  Highest sugar was 280s.  Glucometer: Freestyle lite.  Pt's meals are: - Breakfast: bacon and sausage, scrambled eggs, wheat or rye toast - Lunch: salads, chicken, deli sandwich - Dinner: fish, chicken, eating out 5x/week - Snacks: butter cookies, chips Stopped drinking sweat tea!  - no CKD, last BUN/creatinine:  Lab Results  Component Value Date   BUN 8 12/25/2014   CREATININE 0.62 12/25/2014  On Lisinopril. - last set of lipids: Lab Results  Component Value Date   CHOL 176 12/25/2014   HDL 47.10 12/25/2014   LDLCALC 114* 12/25/2014   TRIG 76.0 12/25/2014   CHOLHDL 4 12/25/2014  On Zocor. - last eye exam was in 12/2014. No DR.  - no numbness and tingling in her feet.  Pt has FH of DM in father, PGM, P aunt.  She also has HTN, HL.  ROS: Constitutional: + weight gain, + fatigue, + hot flushes, + nocturia Eyes: no blurry vision, no xerophthalmia ENT: no sore throat, no nodules palpated in throat, no dysphagia/odynophagia, no hoarseness Cardiovascular: no CP/SOB/palpitations/leg  swelling Respiratory: no cough/SOB Gastrointestinal: no N/V/D/C Musculoskeletal: no muscle/joint aches Skin: no rashes, + hair loss Neurological: no tremors/numbness/tingling/dizziness Psychiatric: no depression/anxiety + low libido  Past Medical History  Diagnosis Date  . Diabetes mellitus 2006  . Hypertension 2012  . Colon polyps 2008    [name]  . Hyperlipemia 2012  . Obesity    Past Surgical History  Procedure Laterality Date  . Vaginal hysterectomy  2006  . Breast biopsy  2003  . Tubal ligation  1990   History   Social History  . Marital Status: Married    Spouse Name: N/A  . Number of Children: 3   Occupational History  . RN    Social History Main Topics  . Smoking status: Never Smoker   . Smokeless tobacco: Never Used  . Alcohol Use: No  . Drug Use: No   Social History Narrative   3 caffeine drinks daily    Current Outpatient Prescriptions on File Prior to Visit  Medication Sig Dispense Refill  . Bee Pollen 550 MG CAPS Take by mouth.    . canagliflozin (INVOKANA) 100 MG TABS tablet Take 1 tablet (100 mg total) by mouth daily. 30 tablet 2  . glimepiride (AMARYL) 4 MG tablet Take 1 tablet (4 mg total) by mouth 2 (two) times daily. 60 tablet 2  . glucose blood (FREESTYLE LITE) test strip Freestyle Lite test strip check blood sugar twice daily 100 each 12  . lisinopril (  PRINIVIL,ZESTRIL) 5 MG tablet TAKE 1 TABLET BY MOUTH EVERY DAY 30 tablet 5  . metFORMIN (GLUCOPHAGE XR) 500 MG 24 hr tablet Take 2 tablets by mouth every evening. 60 tablet 2  . simvastatin (ZOCOR) 20 MG tablet Take 1 tablet (20 mg total) by mouth at bedtime. 30 tablet 2  . amoxicillin-clavulanate (AUGMENTIN) 875-125 MG per tablet Take 1 tablet by mouth 2 (two) times daily. (Patient not taking: Reported on 02/08/2015) 20 tablet 0  . atorvastatin (LIPITOR) 40 MG tablet Take 1 tablet (40 mg total) by mouth daily. (Patient not taking: Reported on 02/08/2015) 90 tablet 3  . fluticasone  (FLONASE) 50 MCG/ACT nasal spray Place 2 sprays into both nostrils daily. (Patient not taking: Reported on 02/08/2015) 16 g 6   No current facility-administered medications on file prior to visit.   Allergies  Allergen Reactions  . Bactrim    Family History  Problem Relation Age of Onset  . Heart failure Father   . Hypertension Mother   . Hypertension Father   . Colon cancer Father 70  . Breast cancer Maternal Grandmother   . Diabetes Father   . Diabetes Paternal Grandmother    PE: BP 118/70 mmHg  Pulse 83  Temp(Src) 97.9 F (36.6 C) (Oral)  Resp 12  Ht 5' 4.5" (1.638 m)  Wt 242 lb 6.4 oz (109.952 kg)  BMI 40.98 kg/m2  SpO2 94% Wt Readings from Last 3 Encounters:  02/08/15 242 lb 6.4 oz (109.952 kg)  12/25/14 242 lb 9.6 oz (110.043 kg)  06/13/14 243 lb (110.224 kg)   Constitutional: obese, in NAD Eyes: PERRLA, EOMI, no exophthalmos ENT: moist mucous membranes, no thyromegaly, no cervical lymphadenopathy Cardiovascular: RRR, No MRG Respiratory: CTA B Gastrointestinal: abdomen soft, NT, ND, BS+ Musculoskeletal: no deformities, strength intact in all 4 Skin: moist, warm, no rashes Neurological: no tremor with outstretched hands, DTR normal in all 4  ASSESSMENT: 1. DM2, non-insulin-dependent, uncontrolled, without complications  PLAN:  1. Patient with long-standing, uncontrolled diabetes, on oral antidiabetic regimen, recently adjusted, with better control in last few weeks after adding Invokana. I do not have sugars later in the day, but the am sugars are greatly improved despite only starting Invokana 2 weeks ago. We reviewed together the latest increase in HbA1c. She stopped drinking sweet tea since then, which greatly helps. We discussed at length about improving her diet >> discussed specific changes in her diet and healthy substitutions. I offered to refer her to nutrition >> she will think about this. - We discussed about options for treatment (she was wondering  whether she needs insulin >> not yet), and I suggested to continue current regimen, keeping in mind that we are not even at maximum Invokana effect yet.  We may need to reduce Amaryl at next visit. Patient Instructions  Please continue: - Metformin XR 1000 mg with dinner - Amaryl 4 mg 2x a day  - Invokana 100 mg daily in am  Please let me know if you want me to refer you to nutrition.  Please let me know if the sugars are consistently <80 or >200.  Please return in 1 month with your sugar log.   - Strongly advised her to start checking sugars at different times of the day - check 1-2 times a day, rotating checks.  - given sugar log and advised how to fill it and to bring it at next appt  - given foot care handout and explained the principles  - given instructions for  hypoglycemia management "15-15 rule"  - advised for yearly eye exams >> she is UTD - Return to clinic in 1 mo with sugar log

## 2015-03-13 ENCOUNTER — Telehealth: Payer: Self-pay | Admitting: Family Medicine

## 2015-03-13 ENCOUNTER — Ambulatory Visit: Payer: 59 | Admitting: Internal Medicine

## 2015-03-13 MED ORDER — CIPROFLOXACIN HCL 250 MG PO TABS: 250.0000 mg | ORAL_TABLET | Freq: Two times a day (BID) | ORAL | Status: DC

## 2015-03-13 NOTE — Telephone Encounter (Signed)
I told her it could cause yeast not UtI If she thinks its a uti we need to check a urine  Diflucan 150 mg #2  1 po qd x1, may repeat in 3 days prn

## 2015-03-13 NOTE — Telephone Encounter (Signed)
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 I advised her that Dr. Etter Sjogren stated she would need appt. Pt out of town. She said she would like call from [place].

## 2015-03-13 NOTE — Telephone Encounter (Signed)
Apt scheduled for Friday.     KP

## 2015-03-13 NOTE — Telephone Encounter (Signed)
To MD to review and advise      KP 

## 2015-03-13 NOTE — Telephone Encounter (Signed)
Rx faxed.    KP 

## 2015-03-13 NOTE — Telephone Encounter (Signed)
Caller name:Ashlynd Cecchi Relationship to patient:self Can be reached: [phone] Pharmacy:[facility] Address: [address] [place] [address]  [phone]   Reason for call: Pt states she thinks that she is in the beginning stages of UTI- frequent urination/ burning. No fever. PT thinks related to her Diabetic meds- was told by Dr. Etter Sjogren to check in and if need be have UTI RX called in.

## 2015-03-13 NOTE — Telephone Encounter (Signed)
cipro 250 mg bid x 3 days --- ov

## 2015-03-13 NOTE — Telephone Encounter (Signed)
Spoke with patient and she stated she is Having Dysuria, Urgency and Hematuria and knows it is a bladder infection, she said she does not want to go to UC because she does not have the extra cash, she is willing to see you when she gets back on Friday but she said she does not want to be in pain for the next few days, she is willing to et the same Rx that you called in the last time. 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 She would like to get an Rx, please advise     KP

## 2015-03-16 ENCOUNTER — Encounter: Payer: Self-pay | Admitting: Family Medicine

## 2015-03-16 ENCOUNTER — Ambulatory Visit (INDEPENDENT_AMBULATORY_CARE_PROVIDER_SITE_OTHER): Payer: 59 | Admitting: Family Medicine

## 2015-03-16 ENCOUNTER — Ambulatory Visit: Payer: 59 | Admitting: Family Medicine

## 2015-03-16 VITALS — BP 120/74 | HR 95 | Temp 97.9°F | Wt 242.2 lb

## 2015-03-16 DIAGNOSIS — R300 Dysuria: Secondary | ICD-10-CM

## 2015-03-16 LAB — POCT URINALYSIS DIPSTICK
Bilirubin, UA: NEGATIVE
Blood, UA: NEGATIVE
KETONES UA: NEGATIVE
LEUKOCYTES UA: NEGATIVE
NITRITE UA: NEGATIVE
PROTEIN UA: NEGATIVE
Spec Grav, UA: >=1.03
Urobilinogen, UA: 2
pH, UA: 6

## 2015-03-16 MED ORDER — PHENAZOPYRIDINE HCL 200 MG PO TABS: 200.0000 mg | ORAL_TABLET | Freq: Three times a day (TID) | ORAL | Status: DC | PRN

## 2015-03-16 NOTE — Progress Notes (Signed)
Pre visit review using our clinic review tool, if applicable. No additional management support is needed unless otherwise documented below in the visit note. 

## 2015-03-16 NOTE — Patient Instructions (Signed)

## 2015-03-16 NOTE — Progress Notes (Signed)
Patient ID: Angela Barr, female    DOB: 1961/04/20  Age: 54 y.o. MRN: [id number]    Subjective:  Subjective HPI Angela Barr presents for f/u UTI-- still some burning with urination  Review of Systems  Constitutional: Negative for activity change, appetite change, fatigue and unexpected weight change.  Respiratory: Negative for cough and shortness of breath.   Cardiovascular: Negative for chest pain and palpitations.  Psychiatric/Behavioral: Negative for behavioral problems and dysphoric mood. The patient is not nervous/anxious.     History Past Medical History  Diagnosis Date  . Diabetes mellitus 2006  . Hypertension 2012  . Colon polyps 2008    Angela Barr--Dr Luvenia Starch  . Hyperlipemia 2012  . Obesity     She has past surgical history that includes Vaginal hysterectomy (2006); Breast biopsy (2003); and Tubal ligation (1990).   Her family history includes Breast cancer in her maternal grandmother; Colon cancer (age of onset: 71) in her father; Diabetes in her father and paternal grandmother; Heart failure in her father; Hypertension in her father and mother.She reports that she has never smoked. She has never used smokeless tobacco. She reports that she does not drink alcohol or use illicit drugs.  Current Outpatient Prescriptions on File Prior to Visit  Medication Sig Dispense Refill  . atorvastatin (LIPITOR) 40 MG tablet Take 1 tablet (40 mg total) by mouth daily. 90 tablet 3  . Bee Pollen 550 MG CAPS Take by mouth.    . canagliflozin (INVOKANA) 100 MG TABS tablet Take 1 tablet (100 mg total) by mouth daily. 30 tablet 2  . fluticasone (FLONASE) 50 MCG/ACT nasal spray Place 2 sprays into both nostrils daily. 16 g 6  . glimepiride (AMARYL) 4 MG tablet Take 1 tablet (4 mg total) by mouth 2 (two) times daily. 60 tablet 2  . glucose blood (FREESTYLE LITE) test strip Freestyle Lite test strip check blood sugar twice daily 100 each 12  . lisinopril (PRINIVIL,ZESTRIL) 5 MG tablet  TAKE 1 TABLET BY MOUTH EVERY DAY 30 tablet 5  . metFORMIN (GLUCOPHAGE XR) 500 MG 24 hr tablet Take 2 tablets by mouth every evening. 60 tablet 2  . simvastatin (ZOCOR) 20 MG tablet Take 1 tablet (20 mg total) by mouth at bedtime. 30 tablet 2   No current facility-administered medications on file prior to visit.     Objective:  Objective Physical Exam  Constitutional: She is oriented to person, place, and time. She appears well-developed and well-nourished.  HENT:  Head: Normocephalic and atraumatic.  Eyes: Conjunctivae and EOM are normal.  Neck: Normal range of motion. Neck supple. No JVD present. Carotid bruit is not present. No thyromegaly present.  Cardiovascular: Normal rate, regular rhythm and normal heart sounds.   No murmur heard. Pulmonary/Chest: Effort normal and breath sounds normal. No respiratory distress. She has no wheezes. She has no rales. She exhibits no tenderness.  Musculoskeletal: She exhibits no edema.  Neurological: She is alert and oriented to person, place, and time.  Psychiatric: She has a normal mood and affect. Her behavior is normal. Judgment and thought content normal.   BP 120/74 mmHg  Pulse 95  Temp(Src) 97.9 F (36.6 C) (Oral)  Wt 242 lb 3.2 oz (109.861 kg)  SpO2 98% Wt Readings from Last 3 Encounters:  03/16/15 242 lb 3.2 oz (109.861 kg)  02/08/15 242 lb 6.4 oz (109.952 kg)  12/25/14 242 lb 9.6 oz (110.043 kg)     Lab Results  Component Value Date   WBC 5.9 12/25/2014  HGB 13.5 12/25/2014   HCT 41.0 12/25/2014   PLT 217.0 12/25/2014   GLUCOSE 235* 12/25/2014   CHOL 176 12/25/2014   TRIG 76.0 12/25/2014   HDL 47.10 12/25/2014   LDLCALC 114* 12/25/2014   ALT 57* 12/25/2014   AST 28 12/25/2014   NA 135 12/25/2014   K 3.7 12/25/2014   CL 100 12/25/2014   CREATININE 0.62 12/25/2014   BUN 8 12/25/2014   CO2 28 12/25/2014   TSH 0.384 12/25/2014   HGBA1C 11.1* 12/25/2014   MICROALBUR 2.0* 12/25/2014    Dg Hip Complete  Left  04/21/2014   CLINICAL DATA:  Leg pain. The patient complains of a kink in the left upper leg since Sunday. Difficulty rolling the toes. No injury.  EXAM: LEFT HIP - COMPLETE 2+ VIEW  COMPARISON:  None.  FINDINGS: There is no evidence of hip fracture or dislocation. There is no evidence of arthropathy or other focal bone abnormality.  IMPRESSION: Negative.   Electronically Signed   By: Lucienne Capers M.D.   On: 04/21/2014 02:15   US Venous Img Lower Unilateral Left  04/21/2014   CLINICAL DATA:  Left groin pain for 3 days, increasing in intensity.  EXAM: Left LOWER EXTREMITY VENOUS DOPPLER ULTRASOUND  TECHNIQUE: Gray-scale sonography with graded compression, as well as color Doppler and duplex ultrasound were performed to evaluate the lower extremity deep venous systems from the level of the common femoral vein and including the common femoral, femoral, profunda femoral, popliteal and calf veins including the posterior tibial, peroneal and gastrocnemius veins when visible. The superficial great saphenous vein was also interrogated. Spectral Doppler was utilized to evaluate flow at rest and with distal augmentation maneuvers in the common femoral, femoral and popliteal veins.  COMPARISON:  None.  FINDINGS: Common Femoral Vein: No evidence of thrombus. Normal compressibility, respiratory phasicity and response to augmentation.  Saphenofemoral Junction: No evidence of thrombus. Normal compressibility and flow on color Doppler imaging.  Profunda Femoral Vein: No evidence of thrombus. Normal compressibility and flow on color Doppler imaging.  Femoral Vein: No evidence of thrombus. Normal compressibility, respiratory phasicity and response to augmentation.  Popliteal Vein: No evidence of thrombus. Normal compressibility, respiratory phasicity and response to augmentation.  Calf Veins: No evidence of thrombus. Normal compressibility and flow on color Doppler imaging.  Superficial Great Saphenous Vein: No evidence  of thrombus. Normal compressibility and flow on color Doppler imaging.  Venous Reflux:  None.  Other Findings:  None.  IMPRESSION: No evidence of deep venous thrombosis.   Electronically Signed   By: Lucienne Capers M.D.   On: 04/21/2014 02:14     Assessment & Plan:  Plan I have discontinued Ms. Schendel's amoxicillin-clavulanate and ciprofloxacin. I am also having her start on phenazopyridine. Additionally, I am having her maintain her Bee Pollen, atorvastatin, glucose blood, fluticasone, metFORMIN, simvastatin, canagliflozin, lisinopril, and glimepiride.  Meds ordered this encounter  Medications  . phenazopyridine (PYRIDIUM) 200 MG tablet    Sig: Take 1 tablet (200 mg total) by mouth 3 (three) times daily as needed for pain.    Dispense:  6 tablet    Refill:  0    Problem List Items Addressed This Visit    None    Visit Diagnoses    Dysuria    -  Primary    Relevant Medications    phenazopyridine (PYRIDIUM) 200 MG tablet    Other Relevant Orders    POCT Urinalysis Dipstick (Completed)    Urine Culture  cipro --finish   Follow-up: Return in about 3 months (around 06/16/2015), or if symptoms worsen or fail to improve.  Garnet Koyanagi, DO

## 2015-03-19 ENCOUNTER — Ambulatory Visit (INDEPENDENT_AMBULATORY_CARE_PROVIDER_SITE_OTHER): Payer: 59 | Admitting: Family

## 2015-03-19 ENCOUNTER — Other Ambulatory Visit (HOSPITAL_COMMUNITY)
Admission: RE | Admit: 2015-03-19 | Discharge: 2015-03-19 | Disposition: A | Discharge status: Home / Self Care | Payer: 59 | Source: Ambulatory Visit | Attending: Family | Admitting: Family

## 2015-03-19 ENCOUNTER — Encounter: Payer: Self-pay | Admitting: Family

## 2015-03-19 VITALS — BP 118/72 | HR 89 | Temp 98.0°F | Resp 16 | Ht 64.75 in | Wt 242.2 lb

## 2015-03-19 DIAGNOSIS — N760 Acute vaginitis: Secondary | ICD-10-CM | POA: Diagnosis not present

## 2015-03-19 DIAGNOSIS — Z113 Encounter for screening for infections with a predominantly sexual mode of transmission: Secondary | ICD-10-CM | POA: Diagnosis present

## 2015-03-19 MED ORDER — FLUCONAZOLE 150 MG PO TABS: ORAL_TABLET | ORAL | Status: DC

## 2015-03-19 NOTE — Assessment & Plan Note (Signed)
Exam and hx consistent with yeast. Wet prep obtained. Will rx with diflucan.

## 2015-03-19 NOTE — Addendum Note (Signed)
Addended by: Kelle Darting A on: 03/19/2015 03:41 PM   Modules accepted: Orders

## 2015-03-19 NOTE — Patient Instructions (Signed)
Hold simvastatin for 1 week due to possible interaction with diflucan.  Start diflucan tonight. If symptoms are not improved in 3 days, you may repeat a second dose.

## 2015-03-19 NOTE — Progress Notes (Signed)
Pre visit review using our clinic review tool, if applicable. No additional management support is needed unless otherwise documented below in the visit note. 

## 2015-03-19 NOTE — Progress Notes (Signed)
Subjective:    Patient ID: Angela Barr, female    DOB: 07-Sep-1961, 54 y.o.   MRN: [id number]  HPI  Angela Barr is a 54 yr old female who presents today with chief complaint of vaginal itching.  Reports swelling, itching and discharge began 2 days ago. Reports that she has been taking invokana x 2 months. Also was recently treated with cipro for UTI.  Denies new partners.  Reports that she started monistat on Friday (only applying to the vulva).   Review of Systems See HPI  Past Medical History  Diagnosis Date  . Diabetes mellitus 2006  . Hypertension 2012  . Colon polyps 2008    [name]  . Hyperlipemia 2012  . Obesity     History   Social History  . Marital Status: Married    Spouse Name: N/A  . Number of Children: 3  . Years of Education: N/A   Occupational History  . RN    Social History Main Topics  . Smoking status: Never Smoker   . Smokeless tobacco: Never Used  . Alcohol Use: No  . Drug Use: No  . Sexual Activity: Not on file   Other Topics Concern  . Not on file   Social History Narrative   3 caffeine drinks daily     Past Surgical History  Procedure Laterality Date  . Vaginal hysterectomy  2006  . Breast biopsy  2003  . Tubal ligation  1990    Family History  Problem Relation Age of Onset  . Heart failure Father   . Hypertension Mother   . Hypertension Father   . Colon cancer Father 31  . Breast cancer Maternal Grandmother   . Diabetes Father   . Diabetes Paternal Grandmother     Allergies  Allergen Reactions  . Bactrim     Current Outpatient Prescriptions on File Prior to Visit  Medication Sig Dispense Refill  . canagliflozin (INVOKANA) 100 MG TABS tablet Take 1 tablet (100 mg total) by mouth daily. 30 tablet 2  . glimepiride (AMARYL) 4 MG tablet Take 1 tablet (4 mg total) by mouth 2 (two) times daily. 60 tablet 2  . glucose blood (FREESTYLE LITE) test strip Freestyle Lite test strip check blood sugar twice daily  100 each 12  . lisinopril (PRINIVIL,ZESTRIL) 5 MG tablet TAKE 1 TABLET BY MOUTH EVERY DAY 30 tablet 5  . metFORMIN (GLUCOPHAGE XR) 500 MG 24 hr tablet Take 2 tablets by mouth every evening. 60 tablet 2  . phenazopyridine (PYRIDIUM) 200 MG tablet Take 1 tablet (200 mg total) by mouth 3 (three) times daily as needed for pain. 6 tablet 0  . simvastatin (ZOCOR) 20 MG tablet Take 1 tablet (20 mg total) by mouth at bedtime. 30 tablet 2   No current facility-administered medications on file prior to visit.    BP 118/72 mmHg  Pulse 89  Temp(Src) 98 F (36.7 C) (Oral)  Resp 16  Ht 5' 4.75" (1.645 m)  Wt 242 lb 3.2 oz (109.861 kg)  BMI 40.60 kg/m2  SpO2 99%       Objective:   Physical Exam  Constitutional: She is oriented to person, place, and time. She appears well-developed and well-nourished. No distress.  Genitourinary:  Some white vaginal discharge is noted.   Neurological: She is alert and oriented to person, place, and time.  Psychiatric: She has a normal mood and affect. Her behavior is normal. Judgment and thought content normal.  Assessment & Plan:

## 2015-03-21 ENCOUNTER — Encounter: Payer: Self-pay | Admitting: Family

## 2015-03-21 LAB — CERVICOVAGINAL ANCILLARY ONLY: Wet Prep (BD Affirm): POSITIVE — AB

## 2015-03-23 ENCOUNTER — Encounter: Payer: Self-pay | Admitting: Internal Medicine

## 2015-03-23 ENCOUNTER — Ambulatory Visit (INDEPENDENT_AMBULATORY_CARE_PROVIDER_SITE_OTHER): Payer: 59 | Admitting: Internal Medicine

## 2015-03-23 VITALS — BP 108/62 | HR 97 | Temp 98.2°F | Resp 12 | Wt 242.0 lb

## 2015-03-23 DIAGNOSIS — E1165 Type 2 diabetes mellitus with hyperglycemia: Secondary | ICD-10-CM | POA: Diagnosis not present

## 2015-03-23 LAB — HEMOGLOBIN A1C: Hgb A1c MFr Bld: 7.6 % — ABNORMAL HIGH (ref 4.6–6.5)

## 2015-03-23 NOTE — Patient Instructions (Signed)
Please continue: - Metformin XR 1000 mg with dinner - Amaryl 4 mg 2x a day  - Invokana 100 mg daily in am  If sugars stay lower later in the day, decrease Amaryl in am to 2 mg.  Stop Lisinopril.  Please stop at the lab.  Please return in 3 months with your sugar log.

## 2015-03-23 NOTE — Progress Notes (Signed)
Patient ID: Savannha Welle, female   DOB: 1960/11/12, 54 y.o.   MRN: [id number]  HPI: Santos Hardwick is a 54 y.o.-year-old female, returning for f/u for DM2, dx in 2007, non-insulin-dependent, uncontrolled, without complications. Last visit 1.5 mo ago.  Last hemoglobin A1c was: Lab Results  Component Value Date   HGBA1C 11.1* 12/25/2014   HGBA1C 8.2* 06/13/2014   HGBA1C 7.6* 06/29/2013   Pt is on a regimen of: - Metformin XR 1000 mg with dinner. She had diarrhea with the regular metformin. - Amaryl 4 mg 2x a day - Invokana 100 mg daily - added 01/22/2015. + yeast inf. >> tx with Diflucan. She had a UTI >> Cipro x 3 days.  Pt checks her sugars 1x a day and they are: - am: 120-150s (170-180's before Invokana) >> 117-150 (150 if eats dinner late) - 2h after b'fast: n/c >> 97 - before lunch: n/c >> 134 - 2h after lunch: n/c - before dinner: n/c >> 68x1, 117 - 2h after dinner: n/c >> 94-128 - bedtime: n/c - nighttime: n/c No lows. Lowest sugar was 120s >> 68; she has hypoglycemia awareness at 110.  Highest sugar was 280s >> 150.  Glucometer: One [employer] Flex  Pt's meals are: - Breakfast: bacon and sausage, scrambled eggs, wheat or rye toast - Lunch: salads, chicken, deli sandwich - Dinner: fish, chicken, eating out 5x/week - Snacks: butter cookies, chips Stopped drinking sweat tea!  - no CKD, last BUN/creatinine:  Lab Results  Component Value Date   BUN 8 12/25/2014   CREATININE 0.62 12/25/2014  On Lisinopril. Component     Latest Ref Rng 01/02/2015  Protein, Urine     5 - 24 mg/dL 7  Protein, 24H Urine     <150 mg/day 126   Component     Latest Ref Rng 12/25/2014  Microalb, Ur     0.0 - 1.9 mg/dL 2.0 (H)  Creatinine,U      271.6  MICROALB/CREAT RATIO     0.0 - 30.0 mg/g 0.7   - last set of lipids: Lab Results  Component Value Date   CHOL 176 12/25/2014   HDL 47.10 12/25/2014   LDLCALC 114* 12/25/2014   TRIG 76.0 12/25/2014   CHOLHDL 4 12/25/2014  On  Zocor. - last eye exam was in 12/2014. No DR.  - no numbness and tingling in her feet.  She also has HTN, HL.  ROS: Constitutional: no weight gain or loss, no fatigue,  + nocturia Eyes: no blurry vision, no xerophthalmia ENT: no sore throat, no nodules palpated in throat, no dysphagia/odynophagia, no hoarseness Cardiovascular: no CP/SOB/palpitations/+ leg swelling Respiratory: no cough/SOB Gastrointestinal: no N/V/D/C Musculoskeletal: no muscle/joint aches Skin: no rashes, no hair loss Neurological: no tremors/numbness/tingling/dizziness + low libido  I reviewed pt's medications, allergies, PMH, social hx, family hx, and changes were documented in the history of present illness. Otherwise, unchanged from my initial visit note.  Past Medical History  Diagnosis Date  . Diabetes mellitus 2006  . Hypertension 2012  . Colon polyps 2008    [name]  . Hyperlipemia 2012  . Obesity    Past Surgical History  Procedure Laterality Date  . Vaginal hysterectomy  2006  . Breast biopsy  2003  . Tubal ligation  1990   History   Social History  . Marital Status: Married    Spouse Name: N/A  . Number of Children: 3   Occupational History  . RN    Social History Main Topics  .  Smoking status: Never Smoker   . Smokeless tobacco: Never Used  . Alcohol Use: No  . Drug Use: No   Social History Narrative   3 caffeine drinks daily    Current Outpatient Prescriptions on File Prior to Visit  Medication Sig Dispense Refill  . canagliflozin (INVOKANA) 100 MG TABS tablet Take 1 tablet (100 mg total) by mouth daily. 30 tablet 2  . glimepiride (AMARYL) 4 MG tablet Take 1 tablet (4 mg total) by mouth 2 (two) times daily. 60 tablet 2  . glucose blood (FREESTYLE LITE) test strip Freestyle Lite test strip check blood sugar twice daily 100 each 12  . lisinopril (PRINIVIL,ZESTRIL) 5 MG tablet TAKE 1 TABLET BY MOUTH EVERY DAY 30 tablet 5  . metFORMIN (GLUCOPHAGE XR) 500 MG 24 hr  tablet Take 2 tablets by mouth every evening. 60 tablet 2  . phenazopyridine (PYRIDIUM) 200 MG tablet Take 1 tablet (200 mg total) by mouth 3 (three) times daily as needed for pain. 6 tablet 0  . simvastatin (ZOCOR) 20 MG tablet Take 1 tablet (20 mg total) by mouth at bedtime. 30 tablet 2  . fluconazole (DIFLUCAN) 150 MG tablet Take one tab by mouth today for yeast infection. May repeat in 3 days if needed. (Patient not taking: Reported on 03/23/2015) 2 tablet 0   No current facility-administered medications on file prior to visit.   Allergies  Allergen Reactions  . Bactrim    Family History  Problem Relation Age of Onset  . Heart failure Father   . Hypertension Mother   . Hypertension Father   . Colon cancer Father 69  . Breast cancer Maternal Grandmother   . Diabetes Father   . Diabetes Paternal Grandmother    PE: BP 108/62 mmHg  Pulse 97  Temp(Src) 98.2 F (36.8 C) (Oral)  Resp 12  Wt 242 lb (109.77 kg)  SpO2 98% Wt Readings from Last 3 Encounters:  03/23/15 242 lb (109.77 kg)  03/19/15 242 lb 3.2 oz (109.861 kg)  03/16/15 242 lb 3.2 oz (109.861 kg)   Constitutional: obese, in NAD Eyes: PERRLA, EOMI, no exophthalmos ENT: moist mucous membranes, no thyromegaly, no cervical lymphadenopathy Cardiovascular: RRR, No MRG Respiratory: CTA B Gastrointestinal: abdomen soft, NT, ND, BS+ Musculoskeletal: no deformities, strength intact in all 4 Skin: moist, warm, no rashes Neurological: no tremor with outstretched hands, DTR normal in all 4  ASSESSMENT: 1. DM2, non-insulin-dependent, uncontrolled, without complications  PLAN:  1. Patient with long-standing, uncontrolled diabetes, on oral antidiabetic regimen, with much improved sugars on Invokana. She had a vaginal yeast inf. This is resolving after 2x Diflucan tabs. I also suggested medicated wipes and probiotics. - We may need to reduce Amaryl soon. For now: Patient Instructions  Please continue: - Metformin XR 1000 mg  with dinner - Amaryl 4 mg 2x a day  - Invokana 100 mg daily in am  If sugars stay lower later in the day, decrease Amaryl in am to 2 mg.  Stop Lisinopril.  Please stop at the lab.  Please return in 3 months with your sugar log.   - continue checking sugars at different times of the day - check 1-2 times a day, rotating checks.  - advised for yearly eye exams >> she is UTD - check HbA1c today - will stop Lisinopril since she has a low BP and feels dizzy at points throughout the day. I reviewed her ACR's >> not high in last 4 years. A recent 24 h urine for  proteins >> normal. No h/o HTN. - Return to clinic in 3 mo with sugar log>> check ACR then.  Office Visit on 03/23/2015  Component Date Value Ref Range Status  . Hgb A1c MFr Bld 03/23/2015 7.6* 4.6 - 6.5 % Final   Glycemic Control Guidelines for People with Diabetes:Non Diabetic:  <6%Goal of Therapy: <7%Additional Action Suggested:  >8%    HbA1c is much improved.

## 2015-03-30 ENCOUNTER — Other Ambulatory Visit: Payer: 59

## 2015-04-09 ENCOUNTER — Encounter: Payer: Self-pay | Admitting: Family Medicine

## 2015-04-09 DIAGNOSIS — E118 Type 2 diabetes mellitus with unspecified complications: Principal | ICD-10-CM

## 2015-04-09 DIAGNOSIS — IMO0002 Reserved for concepts with insufficient information to code with codable children: Secondary | ICD-10-CM

## 2015-04-09 DIAGNOSIS — E1165 Type 2 diabetes mellitus with hyperglycemia: Secondary | ICD-10-CM

## 2015-04-09 MED ORDER — METFORMIN HCL ER 500 MG PO TB24: ORAL_TABLET | ORAL | Status: DC

## 2015-04-13 ENCOUNTER — Encounter: Payer: Self-pay | Admitting: Family Medicine

## 2015-04-13 MED ORDER — GLUCOSE BLOOD VI STRP: ORAL_STRIP | Status: DC

## 2015-04-18 ENCOUNTER — Encounter: Payer: Self-pay | Admitting: Family Medicine

## 2015-04-19 ENCOUNTER — Other Ambulatory Visit: Payer: Self-pay | Admitting: Family

## 2015-04-19 ENCOUNTER — Encounter: Payer: Self-pay | Admitting: Family Medicine

## 2015-04-19 MED ORDER — FLUCONAZOLE 150 MG PO TABS: ORAL_TABLET | ORAL | Status: DC

## 2015-04-19 MED ORDER — CANAGLIFLOZIN 100 MG PO TABS: 100.0000 mg | ORAL_TABLET | Freq: Every day | ORAL | Status: DC

## 2015-05-01 ENCOUNTER — Other Ambulatory Visit: Payer: Self-pay | Admitting: Family Medicine

## 2015-05-08 ENCOUNTER — Telehealth: Payer: Self-pay | Admitting: *Deleted

## 2015-05-08 NOTE — Telephone Encounter (Signed)
FYI- Patient presented to the office thinking her appointment was today.  She is complaining of a large pimple-like area on her back that has been draining for several days.  She states she is keeping the area clean, but is trying to help it drain by squeezing it.  The area has purulent drainage and the surrounding area is reddened.  She states that the area is tender. No fevers.  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  Appointment scheduled with Dr. Etter Sjogren 05/10/15 at 9:15.  Notified patient that if symptoms worsen, she develops a fever, or notices red streaking coming from area to call back or go to [facility] clinic.  She stated understanding and agreed.

## 2015-05-09 ENCOUNTER — Ambulatory Visit: Payer: 59 | Admitting: Medical

## 2015-05-10 ENCOUNTER — Ambulatory Visit (INDEPENDENT_AMBULATORY_CARE_PROVIDER_SITE_OTHER): Payer: 59 | Admitting: Family Medicine

## 2015-05-10 ENCOUNTER — Encounter: Payer: Self-pay | Admitting: Family Medicine

## 2015-05-10 VITALS — BP 122/84 | HR 77 | Temp 97.9°F | Ht 64.75 in | Wt 245.0 lb

## 2015-05-10 DIAGNOSIS — L02219 Cutaneous abscess of trunk, unspecified: Secondary | ICD-10-CM | POA: Insufficient documentation

## 2015-05-10 DIAGNOSIS — L03319 Cellulitis of trunk, unspecified: Secondary | ICD-10-CM

## 2015-05-10 MED ORDER — CEPHALEXIN 500 MG PO CAPS: 500.0000 mg | ORAL_CAPSULE | Freq: Two times a day (BID) | ORAL | Status: DC

## 2015-05-10 NOTE — Progress Notes (Signed)
Subjective:    Patient ID: Angela Barr, female    DOB: Mar 22, 1961, 54 y.o.   MRN: [id number]  HPI  Patient here c/o pimple upper back.  It s been there for months and con't to drain.  Not painful.    Past Medical History  Diagnosis Date  . Diabetes mellitus 2006  . Hypertension 2012  . Colon polyps 2008    [name]  . Hyperlipemia 2012  . Obesity     Review of Systems  Constitutional: Negative for diaphoresis, appetite change, fatigue and unexpected weight change.  Eyes: Negative for pain, redness and visual disturbance.  Cardiovascular: Negative for chest pain, palpitations and leg swelling.  Endocrine: Negative for cold intolerance, heat intolerance, polydipsia, polyphagia and polyuria.  Genitourinary: Negative for dysuria, frequency and difficulty urinating.  Skin:       Papule upper back-- cont to drain  Neurological: Negative for dizziness, light-headedness, numbness and headaches.    Current Outpatient Prescriptions on File Prior to Visit  Medication Sig Dispense Refill  . canagliflozin (INVOKANA) 100 MG TABS tablet Take 1 tablet (100 mg total) by mouth daily. 30 tablet 2  . glucose blood (ONETOUCH VERIO) test strip Check your blood sugar twice daily 100 each 12  . metFORMIN (GLUCOPHAGE XR) 500 MG 24 hr tablet Take 2 tablets by mouth every evening. 60 tablet 2  . simvastatin (ZOCOR) 20 MG tablet Take 1 tablet (20 mg total) by mouth at bedtime. 30 tablet 2   No current facility-administered medications on file prior to visit.       Objective:    Physical Exam  Constitutional: She is oriented to person, place, and time. She appears well-developed and well-nourished.  HENT:  Head: Normocephalic and atraumatic.  Eyes: Conjunctivae and EOM are normal.  Neck: Normal range of motion. Neck supple. Carotid bruit is not present.  Cardiovascular: Normal rate, regular rhythm and normal heart sounds.   Pulmonary/Chest: Effort normal and breath sounds  normal.  Neurological: She is alert and oriented to person, place, and time.  Skin:     Psychiatric: She has a normal mood and affect. Her behavior is normal.    BP 122/84 mmHg  Pulse 77  Temp(Src) 97.9 F (36.6 C) (Oral)  Ht 5' 4.75" (1.645 m)  Wt 245 lb (111.131 kg)  BMI 41.07 kg/m2  SpO2 98% Wt Readings from Last 3 Encounters:  05/10/15 245 lb (111.131 kg)  03/23/15 242 lb (109.77 kg)  03/19/15 242 lb 3.2 oz (109.861 kg)     Lab Results  Component Value Date   WBC 5.9 12/25/2014   HGB 13.5 12/25/2014   HCT 41.0 12/25/2014   PLT 217.0 12/25/2014   GLUCOSE 235* 12/25/2014   CHOL 176 12/25/2014   TRIG 76.0 12/25/2014   HDL 47.10 12/25/2014   LDLCALC 114* 12/25/2014   ALT 57* 12/25/2014   AST 28 12/25/2014   NA 135 12/25/2014   K 3.7 12/25/2014   CL 100 12/25/2014   CREATININE 0.62 12/25/2014   BUN 8 12/25/2014   CO2 28 12/25/2014   TSH 0.384 12/25/2014   HGBA1C 7.6* 03/23/2015   MICROALBUR 2.0* 12/25/2014       Assessment & Plan:   Problem List Items Addressed This Visit    Cellulitis and abscess of trunk - Primary    abx ointment  Keflex If it does not resolved-- pt will rto for I and d Culture pending      Relevant Medications   cephALEXin (KEFLEX) 500  MG capsule      I have discontinued Angela Barr's lisinopril, phenazopyridine, and fluconazole. I am also having her start on cephALEXin. Additionally, I am having her maintain her simvastatin, metFORMIN, glucose blood, canagliflozin, glimepiride, and glimepiride.  Meds ordered this encounter  Medications  . glimepiride (AMARYL) 4 MG tablet    Sig: Take 1 tablet by mouth daily.  Marland Kitchen glimepiride (AMARYL) 2 MG tablet    Sig: Take 1 tablet by mouth at bedtime.    Refill:  0  . cephALEXin (KEFLEX) 500 MG capsule    Sig: Take 1 capsule (500 mg total) by mouth 2 (two) times daily.    Dispense:  20 capsule    Refill:  0     Angela Koyanagi, DO

## 2015-05-10 NOTE — Assessment & Plan Note (Signed)
abx ointment  Keflex If it does not resolved-- pt will rto for I and d Culture pending

## 2015-05-10 NOTE — Progress Notes (Signed)
Pre visit review using our clinic review tool, if applicable. No additional management support is needed unless otherwise documented below in the visit note. 

## 2015-05-10 NOTE — Patient Instructions (Signed)

## 2015-05-11 NOTE — Addendum Note (Signed)
Addended by: Tasia Catchings on: 05/11/2015 09:34 AM   Modules accepted: Orders

## 2015-05-14 ENCOUNTER — Encounter: Payer: Self-pay | Admitting: Family Medicine

## 2015-05-14 LAB — WOUND CULTURE
GRAM STAIN: NONE SEEN
GRAM STAIN: NONE SEEN

## 2015-06-20 ENCOUNTER — Encounter: Payer: Self-pay | Admitting: Family Medicine

## 2015-06-21 NOTE — Telephone Encounter (Signed)
I will not be here tomorrow but maybe Monday?

## 2015-06-25 ENCOUNTER — Ambulatory Visit (INDEPENDENT_AMBULATORY_CARE_PROVIDER_SITE_OTHER): Payer: 59 | Admitting: Family Medicine

## 2015-06-25 ENCOUNTER — Encounter: Payer: Self-pay | Admitting: Internal Medicine

## 2015-06-25 ENCOUNTER — Encounter: Payer: Self-pay | Admitting: Family Medicine

## 2015-06-25 ENCOUNTER — Other Ambulatory Visit (INDEPENDENT_AMBULATORY_CARE_PROVIDER_SITE_OTHER): Payer: 59 | Admitting: *Deleted

## 2015-06-25 ENCOUNTER — Ambulatory Visit (INDEPENDENT_AMBULATORY_CARE_PROVIDER_SITE_OTHER): Payer: 59 | Admitting: Internal Medicine

## 2015-06-25 VITALS — BP 116/78 | HR 97 | Temp 98.1°F | Wt 240.0 lb

## 2015-06-25 VITALS — BP 112/68 | HR 90 | Temp 98.0°F | Resp 12 | Wt 240.0 lb

## 2015-06-25 DIAGNOSIS — E1165 Type 2 diabetes mellitus with hyperglycemia: Secondary | ICD-10-CM

## 2015-06-25 DIAGNOSIS — L723 Sebaceous cyst: Secondary | ICD-10-CM | POA: Diagnosis not present

## 2015-06-25 LAB — POCT GLYCOSYLATED HEMOGLOBIN (HGB A1C): HEMOGLOBIN A1C: 7.7

## 2015-06-25 MED ORDER — GLIMEPIRIDE 2 MG PO TABS: 2.0000 mg | ORAL_TABLET | Freq: Every day | ORAL | Status: DC

## 2015-06-25 MED ORDER — CANAGLIFLOZIN 100 MG PO TABS: 100.0000 mg | ORAL_TABLET | Freq: Every day | ORAL | Status: DC

## 2015-06-25 MED ORDER — GLIMEPIRIDE 4 MG PO TABS: 4.0000 mg | ORAL_TABLET | Freq: Every day | ORAL | Status: DC

## 2015-06-25 NOTE — Progress Notes (Signed)
Pre visit review using our clinic review tool, if applicable. No additional management support is needed unless otherwise documented below in the visit note. 

## 2015-06-25 NOTE — Progress Notes (Signed)
Patient ID: Angela Barr, female   DOB: 08-Oct-1961, 54 y.o.   MRN: [id number]  HPI: Angela Barr is a 54 y.o.-year-old female, returning for f/u for DM2, dx in 2007, non-insulin-dependent, uncontrolled, without complications. Last visit 3 mo ago.  She has an abscess on neck >> draining >> IND'd and had ABx >> gets a new IND today.  Last hemoglobin A1c was: Lab Results  Component Value Date   HGBA1C 7.6* 03/23/2015   HGBA1C 11.1* 12/25/2014   HGBA1C 8.2* 06/13/2014   Pt is on a regimen of: - Metformin XR 1000 mg with dinner. She had diarrhea with the regular metformin. - Amaryl 4 mg in am and 2 mg in pm - Invokana 100 mg daily - added 01/22/2015. + yeast inf. >> tx with Diflucan x2.  She had a UTI >> Cipro x 3 days, not recently.  Pt checks her sugars 1x a day and they are: - am: 120-150s (170-180's before Invokana) >> 117-150 (150 if eats dinner late) >> 170-190 - 2h after b'fast: n/c >> 97 >> n/c - before lunch: n/c >> 134 >> 110-128 - 2h after lunch: n/c - before dinner: n/c >> 68x1, 117 >> 140-150 - 2h after dinner: n/c >> 94-128 >> n/c - bedtime: n/c - nighttime: n/c No lows. Lowest sugar was 120s >> 68 >> 108; she has hypoglycemia awareness at 110.  Highest sugar was 280s >> 150 >> 203.  Glucometer: One [employer] Flex   Pt's meals are: - Breakfast: bacon and sausage, scrambled eggs, wheat or rye toast - Lunch: salads, chicken, deli sandwich - Dinner: fish, chicken, eating out 5x/week - Snacks: butter cookies, chips Stopped drinking sweat tea!  - no CKD, last BUN/creatinine:  Lab Results  Component Value Date   BUN 8 12/25/2014   CREATININE 0.62 12/25/2014  On Lisinopril. Component     Latest Ref Rng 01/02/2015  Protein, Urine     5 - 24 mg/dL 7  Protein, 24H Urine     <150 mg/day 126   Component     Latest Ref Rng 12/25/2014  Microalb, Ur     0.0 - 1.9 mg/dL 2.0 (H)  Creatinine,U      271.6  MICROALB/CREAT RATIO     0.0 - 30.0 mg/g 0.7   - we  stopped Lisinopril since she had a low BP and felt dizzy at points throughout the day. I reviewed her ACR's >> not high in last 4 years. A recent 24 h urine for proteins >> normal. No h/o HTN.  - last set of lipids: Lab Results  Component Value Date   CHOL 176 12/25/2014   HDL 47.10 12/25/2014   LDLCALC 114* 12/25/2014   TRIG 76.0 12/25/2014   CHOLHDL 4 12/25/2014  On Zocor. - last eye exam was in 12/2014. No DR.  - no numbness and tingling in her feet.  ROS: Constitutional: no weight gain or loss, + fatigue,  + nocturia, + hot flushes Eyes: no blurry vision, no xerophthalmia ENT: no sore throat, no nodules palpated in throat, no dysphagia/odynophagia, no hoarseness Cardiovascular: no CP/SOB/palpitations/+ leg swelling Respiratory: no cough/SOB Gastrointestinal: no N/V/D/C Musculoskeletal: no muscle/joint aches Skin: no rashes, no hair loss Neurological: no tremors/numbness/tingling/dizziness  I reviewed pt's medications, allergies, PMH, social hx, family hx, and changes were documented in the history of present illness. Otherwise, unchanged from my initial visit note.  Past Medical History  Diagnosis Date  . Diabetes mellitus 2006  . Hypertension 2012  . Colon polyps  2008    [name]  . Hyperlipemia 2012  . Obesity    Past Surgical History  Procedure Laterality Date  . Vaginal hysterectomy  2006  . Breast biopsy  2003  . Tubal ligation  1990   History   Social History  . Marital Status: Married    Spouse Name: N/A  . Number of Children: 3   Occupational History  . RN    Social History Main Topics  . Smoking status: Never Smoker   . Smokeless tobacco: Never Used  . Alcohol Use: No  . Drug Use: No   Social History Narrative   3 caffeine drinks daily    Current Outpatient Prescriptions on File Prior to Visit  Medication Sig Dispense Refill  . canagliflozin (INVOKANA) 100 MG TABS tablet Take 1 tablet (100 mg total) by mouth daily. 30 tablet 2   . cephALEXin (KEFLEX) 500 MG capsule Take 1 capsule (500 mg total) by mouth 2 (two) times daily. 20 capsule 0  . glimepiride (AMARYL) 2 MG tablet Take 1 tablet by mouth at bedtime.  0  . glimepiride (AMARYL) 4 MG tablet Take 1 tablet by mouth daily.    Marland Kitchen glucose blood (ONETOUCH VERIO) test strip Check your blood sugar twice daily 100 each 12  . metFORMIN (GLUCOPHAGE XR) 500 MG 24 hr tablet Take 2 tablets by mouth every evening. 60 tablet 2  . simvastatin (ZOCOR) 20 MG tablet Take 1 tablet (20 mg total) by mouth at bedtime. 30 tablet 2   No current facility-administered medications on file prior to visit.   Allergies  Allergen Reactions  . Bactrim    Family History  Problem Relation Age of Onset  . Heart failure Father   . Hypertension Mother   . Hypertension Father   . Colon cancer Father 80  . Breast cancer Maternal Grandmother   . Diabetes Father   . Diabetes Paternal Grandmother    PE: BP 112/68 mmHg  Pulse 90  Temp(Src) 98 F (36.7 C) (Oral)  Resp 12  Wt 240 lb (108.863 kg)  SpO2 99% Wt Readings from Last 3 Encounters:  06/25/15 240 lb (108.863 kg)  05/10/15 245 lb (111.131 kg)  03/23/15 242 lb (109.77 kg)   Constitutional: obese, in NAD Eyes: PERRLA, EOMI, no exophthalmos ENT: moist mucous membranes, no thyromegaly, no cervical lymphadenopathy Cardiovascular: RRR, No MRG Respiratory: CTA B Gastrointestinal: abdomen soft, NT, ND, BS+ Musculoskeletal: no deformities, strength intact in all 4 Skin: moist, warm, no rashes Neurological: no tremor with outstretched hands, DTR normal in all 4  ASSESSMENT: 1. DM2, non-insulin-dependent, uncontrolled, without complications  PLAN:  1. Patient with long-standing, uncontrolled diabetes, on oral antidiabetic regimen, with much improved sugars on Invokana. Sugars are higher after she decreased her evening Amaryl >> will increase it back and decrease her am Amaryl. Will also increase Metformin to target dose. - I  recommended:  Patient Instructions  Please try to increase Metformin XR to: 500 mg with b'fast, 500 mg with lunch and 1000 mg with dinner. Please decrease the Amaryl with b'fast to 2 mg and increase the dose with dinner to 4 mg. Continue Invokana 100 mg in am.  Please return in 3 months with your sugar log.   - continue checking sugars at different times of the day - check 1-2 times a day, rotating checks.  - advised for yearly eye exams >> she is UTD - check HbA1c today >> 7.7%  - Return to clinic in 3 mo  with sugar log

## 2015-06-25 NOTE — Patient Instructions (Signed)
Please try to increase Metformin XR to: 500 mg with b'fast, 500 mg with lunch and 1000 mg with dinner. Please decrease the Amaryl with b'fast to 2 mg and increase the dose with dinner to 4 mg. Continue Invokana 100 mg in am.  Please return in 3 months with your sugar log.

## 2015-06-25 NOTE — Patient Instructions (Signed)

## 2015-06-25 NOTE — Progress Notes (Deleted)
Patient ID: Angela Barr, female   DOB: 1960-12-21, 54 y.o.   MRN: [id number]   Subjective:    Patient ID: Angela Barr, female    DOB: 1961-01-07, 54 y.o.   MRN: [id number]  Chief Complaint  Patient presents with  . Abscess    HPI Patient is in today for ***  Past Medical History  Diagnosis Date  . Diabetes mellitus 2006  . Hypertension 2012  . Colon polyps 2008    [name]  . Hyperlipemia 2012  . Obesity     Past Surgical History  Procedure Laterality Date  . Vaginal hysterectomy  2006  . Breast biopsy  2003  . Tubal ligation  1990    Family History  Problem Relation Age of Onset  . Heart failure Father   . Hypertension Mother   . Hypertension Father   . Colon cancer Father 19  . Breast cancer Maternal Grandmother   . Diabetes Father   . Diabetes Paternal Grandmother     Social History   Social History  . Marital Status: Married    Spouse Name: N/A  . Number of Children: 3  . Years of Education: N/A   Occupational History  . RN    Social History Main Topics  . Smoking status: Never Smoker   . Smokeless tobacco: Never Used  . Alcohol Use: No  . Drug Use: No  . Sexual Activity: Not on file   Other Topics Concern  . Not on file   Social History Narrative   3 caffeine drinks daily     Outpatient Prescriptions Prior to Visit  Medication Sig Dispense Refill  . canagliflozin (INVOKANA) 100 MG TABS tablet Take 1 tablet (100 mg total) by mouth daily. 30 tablet 5  . glimepiride (AMARYL) 2 MG tablet Take 1 tablet (2 mg total) by mouth daily before breakfast. 90 tablet 1  . glimepiride (AMARYL) 4 MG tablet Take 1 tablet (4 mg total) by mouth daily with supper. 90 tablet 1  . glucose blood (ONETOUCH VERIO) test strip Check your blood sugar twice daily 100 each 12  . metFORMIN (GLUCOPHAGE XR) 500 MG 24 hr tablet Take 2 tablets by mouth every evening. 60 tablet 2  . simvastatin (ZOCOR) 20 MG tablet Take 1 tablet (20 mg total) by mouth at  bedtime. 30 tablet 2  . cephALEXin (KEFLEX) 500 MG capsule Take 1 capsule (500 mg total) by mouth 2 (two) times daily. 20 capsule 0   No facility-administered medications prior to visit.    Allergies  Allergen Reactions  . Bactrim     Review of Systems  Constitutional: Negative for fever and malaise/fatigue.  HENT: Negative for congestion.   Eyes: Negative for discharge.  Respiratory: Negative for shortness of breath.   Cardiovascular: Negative for chest pain, palpitations and leg swelling.  Gastrointestinal: Negative for nausea and abdominal pain.  Genitourinary: Negative for dysuria.  Musculoskeletal: Negative for falls.  Skin: Negative for rash.  Neurological: Negative for loss of consciousness and headaches.  Endo/Heme/Allergies: Negative for environmental allergies.  Psychiatric/Behavioral: Negative for depression. The patient is not nervous/anxious.        Objective:    Physical Exam  BP 116/78 mmHg  Pulse 97  Temp(Src) 98.1 F (36.7 C) (Oral)  Wt 240 lb (108.863 kg)  SpO2 98% Wt Readings from Last 3 Encounters:  06/25/15 240 lb (108.863 kg)  06/25/15 240 lb (108.863 kg)  05/10/15 245 lb (111.131 kg)     Lab Results  Component  Value Date   WBC 5.9 12/25/2014   HGB 13.5 12/25/2014   HCT 41.0 12/25/2014   PLT 217.0 12/25/2014   GLUCOSE 235* 12/25/2014   CHOL 176 12/25/2014   TRIG 76.0 12/25/2014   HDL 47.10 12/25/2014   LDLCALC 114* 12/25/2014   ALT 57* 12/25/2014   AST 28 12/25/2014   NA 135 12/25/2014   K 3.7 12/25/2014   CL 100 12/25/2014   CREATININE 0.62 12/25/2014   BUN 8 12/25/2014   CO2 28 12/25/2014   TSH 0.384 12/25/2014   HGBA1C 7.7 06/25/2015   MICROALBUR 2.0* 12/25/2014    Lab Results  Component Value Date   TSH 0.384 12/25/2014   Lab Results  Component Value Date   WBC 5.9 12/25/2014   HGB 13.5 12/25/2014   HCT 41.0 12/25/2014   MCV 78.2 12/25/2014   PLT 217.0 12/25/2014   Lab Results  Component Value Date   NA 135  12/25/2014   K 3.7 12/25/2014   CO2 28 12/25/2014   GLUCOSE 235* 12/25/2014   BUN 8 12/25/2014   CREATININE 0.62 12/25/2014   BILITOT 0.4 12/25/2014   ALKPHOS 102 12/25/2014   AST 28 12/25/2014   ALT 57* 12/25/2014   PROT 7.5 12/25/2014   ALBUMIN 4.0 12/25/2014   CALCIUM 9.7 12/25/2014   GFR 129.87 12/25/2014   Lab Results  Component Value Date   CHOL 176 12/25/2014   Lab Results  Component Value Date   HDL 47.10 12/25/2014   Lab Results  Component Value Date   LDLCALC 114* 12/25/2014   Lab Results  Component Value Date   TRIG 76.0 12/25/2014   Lab Results  Component Value Date   CHOLHDL 4 12/25/2014   Lab Results  Component Value Date   HGBA1C 7.7 06/25/2015       Assessment & Plan:   Problem List Items Addressed This Visit    None    Visit Diagnoses    Sebaceous cyst    -  Primary    Relevant Orders    Ambulatory referral to General Surgery       I have discontinued Ms. Peeters's cephALEXin. I am also having her maintain her simvastatin, metFORMIN, glucose blood, glimepiride, glimepiride, and canagliflozin.  No orders of the defined types were placed in this encounter.     Elizabeth Sauer, LPN

## 2015-07-01 NOTE — Progress Notes (Signed)
Patient ID: Angela Barr, female    DOB: [birth year]  Age: 54 y.o. MRN: [id number]    Subjective:  Subjective HPI Angela Barr presents for I and D sebaceous cyst.  No other complaints.    Review of Systems  Constitutional: Negative for fever, chills, diaphoresis, activity change, appetite change, fatigue and unexpected weight change.  Eyes: Negative for pain, redness and visual disturbance.  Respiratory: Negative for cough, chest tightness, shortness of breath and wheezing.   Cardiovascular: Negative for chest pain, palpitations and leg swelling.  Gastrointestinal: Negative for abdominal pain and abdominal distention.  Endocrine: Negative for cold intolerance, heat intolerance, polydipsia, polyphagia and polyuria.  Genitourinary: Negative for dysuria, urgency, frequency, hematuria, flank pain, vaginal discharge, difficulty urinating, genital sores, vaginal pain, menstrual problem, pelvic pain and dyspareunia.  Musculoskeletal: Negative for back pain.  Skin: Negative for color change, rash and wound.  Neurological: Negative for dizziness, light-headedness, numbness and headaches.    History Past Medical History  Diagnosis Date  . Diabetes mellitus 2006  . Hypertension 2012  . Colon polyps 2008    [name]  . Hyperlipemia 2012  . Obesity     She has past surgical history that includes Vaginal hysterectomy (2006); Breast biopsy (2003); and Tubal ligation (1990).   Her family history includes Breast cancer in her maternal grandmother; Colon cancer (age of onset: 36) in her father; Diabetes in her father and paternal grandmother; Heart failure in her father; Hypertension in her father and mother.She reports that she has never smoked. She has never used smokeless tobacco. She reports that she does not drink alcohol or use illicit drugs.  Current Outpatient Prescriptions on File Prior to Visit  Medication Sig Dispense Refill  . canagliflozin (INVOKANA) 100 MG TABS  tablet Take 1 tablet (100 mg total) by mouth daily. 30 tablet 5  . glimepiride (AMARYL) 2 MG tablet Take 1 tablet (2 mg total) by mouth daily before breakfast. 90 tablet 1  . glimepiride (AMARYL) 4 MG tablet Take 1 tablet (4 mg total) by mouth daily with supper. 90 tablet 1  . glucose blood (ONETOUCH VERIO) test strip Check your blood sugar twice daily 100 each 12  . metFORMIN (GLUCOPHAGE XR) 500 MG 24 hr tablet Take 2 tablets by mouth every evening. 60 tablet 2  . simvastatin (ZOCOR) 20 MG tablet Take 1 tablet (20 mg total) by mouth at bedtime. 30 tablet 2   No current facility-administered medications on file prior to visit.     Objective:  Objective Physical Exam  Constitutional: She is oriented to person, place, and time. She appears well-developed and well-nourished.  HENT:  Head: Normocephalic and atraumatic.  Eyes: Conjunctivae and EOM are normal.  Neck: Normal range of motion. Neck supple. No JVD present. Carotid bruit is not present. No thyromegaly present.  Cardiovascular: Normal rate, regular rhythm and normal heart sounds.   No murmur heard. Pulmonary/Chest: Effort normal and breath sounds normal. No respiratory distress. She has no wheezes. She has no rales. She exhibits no tenderness.  Musculoskeletal: She exhibits no edema.  Neurological: She is alert and oriented to person, place, and time.  Skin:     Psychiatric: She has a normal mood and affect.   BP 116/78 mmHg  Pulse 97  Temp(Src) 98.1 F (36.7 C) (Oral)  Wt 240 lb (108.863 kg)  SpO2 98% Wt Readings from Last 3 Encounters:  06/25/15 240 lb (108.863 kg)  06/25/15 240 lb (108.863 kg)  05/10/15 245 lb (111.131 kg)  Lab Results  Component Value Date   WBC 5.9 12/25/2014   HGB 13.5 12/25/2014   HCT 41.0 12/25/2014   PLT 217.0 12/25/2014   GLUCOSE 235* 12/25/2014   CHOL 176 12/25/2014   TRIG 76.0 12/25/2014   HDL 47.10 12/25/2014   LDLCALC 114* 12/25/2014   ALT 57* 12/25/2014   AST 28 12/25/2014     NA 135 12/25/2014   K 3.7 12/25/2014   CL 100 12/25/2014   CREATININE 0.62 12/25/2014   BUN 8 12/25/2014   CO2 28 12/25/2014   TSH 0.384 12/25/2014   HGBA1C 7.7 06/25/2015   MICROALBUR 2.0* 12/25/2014    No results found.   Assessment & Plan:  Plan I have discontinued [name]'s cephALEXin. I am also having her maintain her simvastatin, metFORMIN, glucose blood, glimepiride, glimepiride, and canagliflozin.  No orders of the defined types were placed in this encounter.    Problem List Items Addressed This Visit    None    Visit Diagnoses    Sebaceous cyst    -  Primary    Relevant Orders    Ambulatory referral to General Surgery       Follow-up: Return if symptoms worsen or fail to improve.  Angela Koyanagi, DO

## 2015-07-10 ENCOUNTER — Other Ambulatory Visit: Payer: Self-pay | Admitting: Family Medicine

## 2015-07-14 ENCOUNTER — Other Ambulatory Visit: Payer: Self-pay | Admitting: Family Medicine

## 2015-08-09 ENCOUNTER — Other Ambulatory Visit: Payer: Self-pay | Admitting: Surgery

## 2015-08-15 ENCOUNTER — Telehealth: Payer: Self-pay | Admitting: Family Medicine

## 2015-08-15 ENCOUNTER — Telehealth: Payer: Self-pay

## 2015-08-15 ENCOUNTER — Telehealth: Payer: Self-pay | Admitting: Internal Medicine

## 2015-08-15 NOTE — Telephone Encounter (Signed)
Patient returned my call. States she has not been able to get blood sugars above 126. States they drop down to as low as 80. Feels like her meds need adjusting. Appointment scheduled states she can wait until tomorrow based on current symptoms.

## 2015-08-15 NOTE — Telephone Encounter (Signed)
Caller name: Kathern Lobosco   Relationship to patient: Self   Can be reached: [phone]   Reason for call: pt is calling in because she says that she is currently taking 3 different diabetic medications. She says that she has been breaking out in sweats 3 to 4 times a day. And keeps having a elevated blood level. She is requesting a call back to discuss if this is normal or should she be concerned. She says that this is something new that she's experiencing.

## 2015-08-15 NOTE — Telephone Encounter (Signed)
Pt has been sweating profusely blood sugar has not been high at all she states she even ate pasta yesterday and the highest she got was 127 please advise, feels her blood sugar is staying too low

## 2015-08-15 NOTE — Telephone Encounter (Signed)
Please read message below and advise.  

## 2015-08-16 ENCOUNTER — Encounter: Payer: Self-pay | Admitting: Internal Medicine

## 2015-08-16 ENCOUNTER — Ambulatory Visit (INDEPENDENT_AMBULATORY_CARE_PROVIDER_SITE_OTHER): Payer: 59 | Admitting: Family Medicine

## 2015-08-16 ENCOUNTER — Encounter: Payer: Self-pay | Admitting: Family Medicine

## 2015-08-16 VITALS — BP 139/81 | HR 86 | Temp 98.1°F | Wt 240.8 lb

## 2015-08-16 DIAGNOSIS — E1122 Type 2 diabetes mellitus with diabetic chronic kidney disease: Secondary | ICD-10-CM | POA: Diagnosis not present

## 2015-08-16 DIAGNOSIS — N182 Chronic kidney disease, stage 2 (mild): Secondary | ICD-10-CM | POA: Diagnosis not present

## 2015-08-16 DIAGNOSIS — E0822 Diabetes mellitus due to underlying condition with diabetic chronic kidney disease: Secondary | ICD-10-CM | POA: Diagnosis not present

## 2015-08-16 MED ORDER — METFORMIN HCL ER 500 MG PO TB24: ORAL_TABLET | ORAL | Status: DC

## 2015-08-16 NOTE — Telephone Encounter (Signed)
I cannot tell if her her sugars are too low unless she checks them during these episodes. Please let me know if sugars are under 80s.

## 2015-08-16 NOTE — Telephone Encounter (Signed)
Spoke with patient regarding symptoms, Appointment scheduled.

## 2015-08-16 NOTE — Patient Instructions (Signed)

## 2015-08-16 NOTE — Telephone Encounter (Signed)
Called pt and lvm advising her per Dr Gherghe's message below.  

## 2015-08-16 NOTE — Progress Notes (Signed)
Patient ID: Angela Barr, female    DOB: 1961/03/28  Age: 54 y.o. MRN: [id number]    Subjective:  Subjective HPI Angela Barr presents for c/o low blood sugar.  It has dropped as low as 79 and she gets hot flashes.      Review of Systems  Constitutional: Negative for diaphoresis, appetite change, fatigue and unexpected weight change.  Eyes: Negative for pain, redness and visual disturbance.  Respiratory: Negative for cough, chest tightness, shortness of breath and wheezing.   Cardiovascular: Negative for chest pain, palpitations and leg swelling.  Endocrine: Negative for cold intolerance, heat intolerance, polydipsia, polyphagia and polyuria.  Genitourinary: Negative for dysuria, frequency and difficulty urinating.  Neurological: Negative for dizziness, light-headedness, numbness and headaches.    History Past Medical History  Diagnosis Date  . Diabetes mellitus 2006  . Hypertension 2012  . Colon polyps 2008    [name]  . Hyperlipemia 2012  . Obesity     She has past surgical history that includes Vaginal hysterectomy (2006); Breast biopsy (2003); and Tubal ligation (1990).   Her family history includes Breast cancer in her maternal grandmother; Colon cancer (age of onset: 47) in her father; Diabetes in her father and paternal grandmother; Heart failure in her father; Hypertension in her father and mother.She reports that she has never smoked. She has never used smokeless tobacco. She reports that she does not drink alcohol or use illicit drugs.  Current Outpatient Prescriptions on File Prior to Visit  Medication Sig Dispense Refill  . canagliflozin (INVOKANA) 100 MG TABS tablet Take 1 tablet (100 mg total) by mouth daily. 30 tablet 5  . glimepiride (AMARYL) 4 MG tablet Take 1 tablet (4 mg total) by mouth daily with supper. 90 tablet 1  . glucose blood (ONETOUCH VERIO) test strip Check your blood sugar twice daily 100 each 12  . simvastatin (ZOCOR) 20 MG  tablet Take 1 tablet (20 mg total) by mouth at bedtime. 30 tablet 2   No current facility-administered medications on file prior to visit.     Objective:  Objective Physical Exam  Constitutional: She is oriented to person, place, and time. She appears well-developed and well-nourished.  HENT:  Head: Normocephalic and atraumatic.  Eyes: Conjunctivae and EOM are normal.  Neck: Normal range of motion. Neck supple. No JVD present. Carotid bruit is not present. No thyromegaly present.  Cardiovascular: Normal rate, regular rhythm and normal heart sounds.   No murmur heard. Pulmonary/Chest: Effort normal and breath sounds normal. No respiratory distress. She has no wheezes. She has no rales. She exhibits no tenderness.  Musculoskeletal: She exhibits no edema.  Neurological: She is alert and oriented to person, place, and time.  Psychiatric: She has a normal mood and affect. Her behavior is normal. Judgment and thought content normal.  Nursing note and vitals reviewed.  BP 139/81 mmHg  Pulse 86  Temp(Src) 98.1 F (36.7 C) (Oral)  Wt 240 lb 12.8 oz (109.226 kg)  SpO2 98% Wt Readings from Last 3 Encounters:  08/16/15 240 lb 12.8 oz (109.226 kg)  06/25/15 240 lb (108.863 kg)  06/25/15 240 lb (108.863 kg)     Lab Results  Component Value Date   WBC 5.9 12/25/2014   HGB 13.5 12/25/2014   HCT 41.0 12/25/2014   PLT 217.0 12/25/2014   GLUCOSE 235* 12/25/2014   CHOL 176 12/25/2014   TRIG 76.0 12/25/2014   HDL 47.10 12/25/2014   LDLCALC 114* 12/25/2014   ALT 57* 12/25/2014   AST  28 12/25/2014   NA 135 12/25/2014   K 3.7 12/25/2014   CL 100 12/25/2014   CREATININE 0.62 12/25/2014   BUN 8 12/25/2014   CO2 28 12/25/2014   TSH 0.384 12/25/2014   HGBA1C 7.7 06/25/2015   MICROALBUR 2.0* 12/25/2014    No results found.   Assessment & Plan:  Plan I have changed [name]'s metFORMIN. I am also having her maintain her simvastatin, glucose blood, glimepiride, and  canagliflozin.  Meds ordered this encounter  Medications  . metFORMIN (GLUCOPHAGE-XR) 500 MG 24 hr tablet    Sig: Take 500 mg in am and 1000 mg with dinner    Dispense:  120 tablet    Refill:  5    Problem List Items Addressed This Visit    None    Visit Diagnoses    Diabetes mellitus due to underlying condition with stage 2 chronic kidney disease, without long-term current use of insulin (HCC)    -  Primary    Relevant Medications    metFORMIN (GLUCOPHAGE-XR) 500 MG 24 hr tablet     stop amaryl 2 mg  F/u endo  Follow-up: Return in about 6 months (around 02/14/2016) for hypertension, hyperlipidemia, diabetes II, fasting, annual exam.  Garnet Koyanagi, DO

## 2015-08-16 NOTE — Progress Notes (Signed)
Pre visit review using our clinic review tool, if applicable. No additional management support is needed unless otherwise documented below in the visit note. 

## 2015-09-10 ENCOUNTER — Encounter: Payer: Self-pay | Admitting: Family Medicine

## 2015-09-25 ENCOUNTER — Ambulatory Visit (INDEPENDENT_AMBULATORY_CARE_PROVIDER_SITE_OTHER): Payer: 59 | Admitting: Internal Medicine

## 2015-09-25 ENCOUNTER — Encounter: Payer: Self-pay | Admitting: Internal Medicine

## 2015-09-25 VITALS — BP 124/68 | HR 90 | Temp 98.0°F | Resp 12 | Wt 237.0 lb

## 2015-09-25 DIAGNOSIS — E1165 Type 2 diabetes mellitus with hyperglycemia: Secondary | ICD-10-CM

## 2015-09-25 DIAGNOSIS — B3731 Acute candidiasis of vulva and vagina: Secondary | ICD-10-CM

## 2015-09-25 DIAGNOSIS — B373 Candidiasis of vulva and vagina: Secondary | ICD-10-CM

## 2015-09-25 MED ORDER — FLUCONAZOLE 150 MG PO TABS: 150.0000 mg | ORAL_TABLET | Freq: Once | ORAL | Status: DC

## 2015-09-25 NOTE — Patient Instructions (Signed)
Please continue: - Metformin XR [id number] mg  - Amaryl 4 mg before dinner - Invokana 100 mg daily in am  Please check some sugars at bedtime.  Please return in 3 months with your sugar log.

## 2015-09-25 NOTE — Progress Notes (Signed)
Patient ID: Angela Barr, female   DOB: 28-Sep-1961, 54 y.o.   MRN: [id number]  HPI: Angela Barr is a 54 y.o.-year-old female, returning for f/u for DM2, dx in 2007, non-insulin-dependent, uncontrolled, without complications. Last visit 3 mo ago.  She has a sinus inf >> on Zpack.  Last hemoglobin A1c was: Lab Results  Component Value Date   HGBA1C 7.7 06/25/2015   HGBA1C 7.6* 03/23/2015   HGBA1C 11.1* 12/25/2014   Pt is on a regimen of: - Metformin XR 270 [id number] mg (decreased by PCP). She had diarrhea with the regular metformin. - Amaryl 4 mg in pm (decreased by PCP) - Invokana 100 mg daily - added 01/22/2015. + yeast inf. >> tx with Diflucan x2. She has a new yeast inf. She had a UTI >> Cipro x 3 days, not recently.  Pt checks her sugars 1x a day and they are better: - am: 120-150s (170-180's before Invokana) >> 117-150 (150 if eats dinner late) >> 170-190 >> 121-130 - 2h after b'fast: n/c >> 97 >> n/c - before lunch: n/c >> 134 >> 110-128 >> 110s - 2h after lunch: n/c >> 110-120 - before dinner: n/c >> 68x1, 117 >> 140-150 >> 120-128 - 2h after dinner: n/c >> 94-128 >> n/c - bedtime: n/c  - nighttime: n/c No lows. Lowest sugar was 120s >> 68 >> 108 >> 71; she has hypoglycemia awareness at 110.  Highest sugar was 280s >> 150 >> 203 >> 138.  Glucometer: One [employer] Flex   Pt's meals are: - Breakfast: bacon and sausage, scrambled eggs, wheat or rye toast - Lunch: salads, chicken, deli sandwich - Dinner: fish, chicken, eating out 5x/week - Snacks: butter cookies, chips Stopped drinking sweat tea! Eats sunflower seeds and nuts.  - no CKD, last BUN/creatinine:  Lab Results  Component Value Date   BUN 8 12/25/2014   CREATININE 0.62 12/25/2014  On Lisinopril. Component     Latest Ref Rng 01/02/2015  Protein, Urine     5 - 24 mg/dL 7  Protein, 24H Urine     <150 mg/day 126   Component     Latest Ref Rng 12/25/2014  Microalb, Ur     0.0 - 1.9 mg/dL 2.0 (H)   Creatinine,U      271.6  MICROALB/CREAT RATIO     0.0 - 30.0 mg/g 0.7   - we stopped Lisinopril since she had a low BP and felt dizzy at points throughout the day. ACR's >> not high in last 4 years. 24 h urine for proteins >> normal. No h/o HTN.  - last set of lipids: Lab Results  Component Value Date   CHOL 176 12/25/2014   HDL 47.10 12/25/2014   LDLCALC 114* 12/25/2014   TRIG 76.0 12/25/2014   CHOLHDL 4 12/25/2014  Not on Zocor in few months - she was forgetting it. - last eye exam was in 12/2014. No DR.  - no numbness and tingling in her feet.  ROS: Constitutional: + weight loss, no fatigue,  + nocturia, + hot flushes Eyes: no blurry vision, no xerophthalmia ENT: no sore throat, no nodules palpated in throat, no dysphagia/odynophagia, no hoarseness Cardiovascular: no CP/SOB/palpitations/leg swelling Respiratory: no cough/SOB Gastrointestinal: no N/V/D/C Musculoskeletal: no muscle/joint aches Skin: no rashes, no hair loss Neurological: no tremors/numbness/tingling/dizziness  I reviewed pt's medications, allergies, PMH, social hx, family hx, and changes were documented in the history of present illness. Otherwise, unchanged from my initial visit note.  Past Medical History  Diagnosis Date  .  Diabetes mellitus 2006  . Hypertension 2012  . Colon polyps 2008    [name]  . Hyperlipemia 2012  . Obesity    Past Surgical History  Procedure Laterality Date  . Vaginal hysterectomy  2006  . Breast biopsy  2003  . Tubal ligation  1990   History   Social History  . Marital Status: Married    Spouse Name: N/A  . Number of Children: 3   Occupational History  . RN    Social History Main Topics  . Smoking status: Never Smoker   . Smokeless tobacco: Never Used  . Alcohol Use: No  . Drug Use: No   Social History Narrative   3 caffeine drinks daily    Current Outpatient Prescriptions on File Prior to Visit  Medication Sig Dispense Refill  .  canagliflozin (INVOKANA) 100 MG TABS tablet Take 1 tablet (100 mg total) by mouth daily. 30 tablet 5  . glimepiride (AMARYL) 4 MG tablet Take 1 tablet (4 mg total) by mouth daily with supper. 90 tablet 1  . glucose blood (ONETOUCH VERIO) test strip Check your blood sugar twice daily 100 each 12  . metFORMIN (GLUCOPHAGE-XR) 500 MG 24 hr tablet Take 500 mg in am and 1000 mg with dinner 120 tablet 5   No current facility-administered medications on file prior to visit.   Allergies  Allergen Reactions  . Bactrim    Family History  Problem Relation Age of Onset  . Heart failure Father   . Hypertension Mother   . Hypertension Father   . Colon cancer Father 54  . Breast cancer Maternal Grandmother   . Diabetes Father   . Diabetes Paternal Grandmother    PE: BP 124/68 mmHg  Pulse 90  Temp(Src) 98 F (36.7 C) (Oral)  Resp 12  Wt 237 lb (107.502 kg)  SpO2 97% Wt Readings from Last 3 Encounters:  09/25/15 237 lb (107.502 kg)  08/16/15 240 lb 12.8 oz (109.226 kg)  06/25/15 240 lb (108.863 kg)   Constitutional: obese, in NAD Eyes: PERRLA, EOMI, no exophthalmos ENT: moist mucous membranes, no thyromegaly, no cervical lymphadenopathy Cardiovascular: RRR, No MRG Respiratory: CTA B Gastrointestinal: abdomen soft, NT, ND, BS+ Musculoskeletal: no deformities, strength intact in all 4 Skin: moist, warm, no rashes Neurological: no tremor with outstretched hands, DTR normal in all 4  ASSESSMENT: 1. DM2, non-insulin-dependent, uncontrolled, without complications  2. Yeast vaginitis  PLAN:  1. Patient with long-standing, uncontrolled diabetes, on oral antidiabetic regimen, with much improved sugars on Invokana. Sugars are great! She has a yeast inf though (the 3rd on Invokana) but would like to continue the med. - I recommended:  Patient Instructions  Please continue: - Metformin XR [phone] mg  - Amaryl 4 mg before dinner - Invokana 100 mg daily in am  Please check some sugars at  bedtime.  Please return in 3 months with your sugar log.  - continue checking sugars at different times of the day - check 1-2 times a day, rotating checks.  - advised for yearly eye exams >> she is UTD - check HbA1c today >> 7.1% (decreased!) - Return to clinic in 3 mo with sugar log  2. Yeast vaginitis - will tx with Diflucan 150 mg x 1 with 1 repeat dose prn in 3 days

## 2015-10-16 ENCOUNTER — Other Ambulatory Visit: Payer: Self-pay | Admitting: Surgery

## 2015-11-01 ENCOUNTER — Encounter: Payer: Self-pay | Admitting: Family Medicine

## 2015-11-01 NOTE — Telephone Encounter (Signed)
Called pt and scheduled OV 11/02/15 11am.

## 2015-11-02 ENCOUNTER — Encounter: Payer: Self-pay | Admitting: Family Medicine

## 2015-11-02 ENCOUNTER — Telehealth: Payer: Self-pay | Admitting: Family Medicine

## 2015-11-02 ENCOUNTER — Ambulatory Visit (INDEPENDENT_AMBULATORY_CARE_PROVIDER_SITE_OTHER): Payer: 59 | Admitting: Family Medicine

## 2015-11-02 VITALS — BP 138/90 | HR 104 | Temp 98.3°F | Wt 237.6 lb

## 2015-11-02 DIAGNOSIS — R52 Pain, unspecified: Secondary | ICD-10-CM | POA: Diagnosis not present

## 2015-11-02 DIAGNOSIS — A0811 Acute gastroenteropathy due to Norwalk agent: Secondary | ICD-10-CM

## 2015-11-02 LAB — POCT INFLUENZA A/B
Influenza A, POC: NEGATIVE
Influenza B, POC: NEGATIVE

## 2015-11-02 MED ORDER — ONDANSETRON 8 MG PO TBDP: 8.0000 mg | ORAL_TABLET | Freq: Three times a day (TID) | ORAL | Status: DC | PRN

## 2015-11-02 MED ORDER — PROMETHAZINE HCL 25 MG/ML IJ SOLN
50.0000 mg | Freq: Once | INTRAMUSCULAR | Status: AC
  Administered 2015-11-02: 50 mg via INTRAMUSCULAR

## 2015-11-02 NOTE — Telephone Encounter (Signed)
Pt called bc the work note she was given today was for a different pt. Reprinted and faxed her note to her job, [employer], fax # [phone]. Pt will shred the wrong note.

## 2015-11-02 NOTE — Patient Instructions (Addendum)
Norovirus Infection °A norovirus infection is caused by exposure to a virus in a group of similar viruses (noroviruses). This type of infection causes inflammation in your stomach and intestines (gastroenteritis). Norovirus is the most common cause of gastroenteritis. It also causes food poisoning. °Anyone can get a norovirus infection. It spreads very easily (contagious). You can get it from contaminated food, water, surfaces, or other people. Norovirus is found in the stool or vomit of infected people. You can spread the infection as soon as you feel sick until 2 weeks after you recover.  °Symptoms usually begin within 2 days after you become infected. Most norovirus symptoms affect the digestive system. °CAUSES °Norovirus infection is caused by contact with norovirus. You can catch norovirus if you: °· Eat or drink something contaminated with norovirus. °· Touch surfaces or objects contaminated with norovirus and then put your hand in your mouth. °· Have direct contact with an infected person who has symptoms. °· Share food, drink, or utensils with someone with who is sick with norovirus. °SIGNS AND SYMPTOMS °Symptoms of norovirus may include: °· Nausea. °· Vomiting. °· Diarrhea. °· Stomach cramps. °· Fever. °· Chills. °· Headache. °· Muscle aches. °· Tiredness. °DIAGNOSIS °Your health care provider may suspect norovirus based on your symptoms and physical exam. Your health care provider may also test a sample of your stool or vomit for the virus.  °TREATMENT °There is no specific treatment for norovirus. Most people get better without treatment in about 2 days. °HOME CARE INSTRUCTIONS °· Replace lost fluids by drinking plenty of water or rehydration fluids containing important minerals called electrolytes. This prevents dehydration. Drink enough fluid to keep your urine clear or pale yellow. °· Do not prepare food for others while you are infected. Wait at least 3 days after recovering from the illness to do  that. °PREVENTION  °· Wash your hands often, especially after using the toilet or changing a diaper. °· Wash fruits and vegetables thoroughly before preparing or serving them. °· Throw out any food that a sick person may have touched. °· Disinfect contaminated surfaces immediately after someone in the household has been sick. Use a bleach-based household cleaner. °· Immediately remove and wash soiled clothes or sheets. °SEEK MEDICAL CARE IF: °· Your vomiting, diarrhea, and stomach pain is getting worse. °· Your symptoms of norovirus do not go away after 2-3 days. °SEEK IMMEDIATE MEDICAL CARE IF:  °You develop symptoms of dehydration that do not improve with fluid replacement. This may include: °· Excessive sleepiness. °· Lack of tears. °· Dry mouth. °· Dizziness when standing. °· Weak pulse. °  °This information is not intended to replace advice given to you by your health care provider. Make sure you discuss any questions you have with your health care provider. °  °Document Released: 01/03/2003 Document Revised: 11/03/2014 Document Reviewed: 03/23/2014 °[employer] Interactive Patient Education ©2016 [employer] ° °

## 2015-11-02 NOTE — Progress Notes (Signed)
Pre visit review using our clinic review tool, if applicable. No additional management support is needed unless otherwise documented below in the visit note. 

## 2015-11-04 DIAGNOSIS — A0811 Acute gastroenteropathy due to Norwalk agent: Secondary | ICD-10-CM | POA: Insufficient documentation

## 2015-11-04 NOTE — Progress Notes (Signed)
Patient ID: Angela Barr, female    DOB: May 30, 1961  Age: 55 y.o. MRN: [id number]    Subjective:  Subjective HPI Angela Barr presents for c/o N/v/d x 2 days -- she has been unable to hold anything down.  + fevers .  Review of Systems  Constitutional: Positive for fever, chills and appetite change. Negative for diaphoresis, fatigue and unexpected weight change.  Eyes: Negative for pain, redness and visual disturbance.  Respiratory: Negative for cough, chest tightness, shortness of breath and wheezing.   Cardiovascular: Negative for chest pain, palpitations and leg swelling.  Gastrointestinal: Positive for nausea, vomiting and diarrhea.  Endocrine: Negative for cold intolerance, heat intolerance, polydipsia, polyphagia and polyuria.  Genitourinary: Negative for dysuria, frequency and difficulty urinating.  Neurological: Negative for dizziness, light-headedness, numbness and headaches.  Psychiatric/Behavioral: Negative for dysphoric mood. The patient is not nervous/anxious.   All other systems reviewed and are negative.   History Past Medical History  Diagnosis Date  . Diabetes mellitus 2006  . Hypertension 2012  . Colon polyps 2008    [name]  . Hyperlipemia 2012  . Obesity     She has past surgical history that includes Vaginal hysterectomy (2006); Breast biopsy (2003); and Tubal ligation (1990).   Her family history includes Breast cancer in her maternal grandmother; Colon cancer (age of onset: 42) in her father; Diabetes in her father and paternal grandmother; Heart failure in her father; Hypertension in her father and mother.She reports that she has never smoked. She has never used smokeless tobacco. She reports that she does not drink alcohol or use illicit drugs.  Current Outpatient Prescriptions on File Prior to Visit  Medication Sig Dispense Refill  . canagliflozin (INVOKANA) 100 MG TABS tablet Take 1 tablet (100 mg total) by mouth daily. 30 tablet 5    . glimepiride (AMARYL) 4 MG tablet Take 1 tablet (4 mg total) by mouth daily with supper. 90 tablet 1  . glucose blood (ONETOUCH VERIO) test strip Check your blood sugar twice daily 100 each 12  . metFORMIN (GLUCOPHAGE-XR) 500 MG 24 hr tablet Take 500 mg in am and 1000 mg with dinner 120 tablet 5   No current facility-administered medications on file prior to visit.     Objective:  Objective Physical Exam  Constitutional: She is oriented to person, place, and time. She appears well-developed and well-nourished.  HENT:  Head: Normocephalic and atraumatic.  Eyes: Conjunctivae and EOM are normal.  Neck: Normal range of motion. Neck supple. No JVD present. Carotid bruit is not present. No thyromegaly present.  Cardiovascular: Normal rate, regular rhythm and normal heart sounds.   No murmur heard. Pulmonary/Chest: Effort normal and breath sounds normal. No respiratory distress. She has no wheezes. She has no rales. She exhibits no tenderness.  Musculoskeletal: She exhibits no edema.  Neurological: She is alert and oriented to person, place, and time.  Psychiatric: She has a normal mood and affect.  Nursing note and vitals reviewed.  BP 138/90 mmHg  Pulse 104  Temp(Src) 98.3 F (36.8 C) (Oral)  Wt 237 lb 9.6 oz (107.775 kg)  SpO2 98% Wt Readings from Last 3 Encounters:  11/02/15 237 lb 9.6 oz (107.775 kg)  09/25/15 237 lb (107.502 kg)  08/16/15 240 lb 12.8 oz (109.226 kg)     Lab Results  Component Value Date   WBC 5.9 12/25/2014   HGB 13.5 12/25/2014   HCT 41.0 12/25/2014   PLT 217.0 12/25/2014   GLUCOSE 235* 12/25/2014   CHOL  176 12/25/2014   TRIG 76.0 12/25/2014   HDL 47.10 12/25/2014   LDLCALC 114* 12/25/2014   ALT 57* 12/25/2014   AST 28 12/25/2014   NA 135 12/25/2014   K 3.7 12/25/2014   CL 100 12/25/2014   CREATININE 0.62 12/25/2014   BUN 8 12/25/2014   CO2 28 12/25/2014   TSH 0.384 12/25/2014   HGBA1C 7.7 06/25/2015   MICROALBUR 2.0* 12/25/2014    No  results found.   Assessment & Plan:  Plan I have discontinued Ms. Heuer's fluconazole. I am also having her start on ondansetron. Additionally, I am having her maintain her glucose blood, glimepiride, canagliflozin, and metFORMIN. We administered promethazine.  Meds ordered this encounter  Medications  . ondansetron (ZOFRAN ODT) 8 MG disintegrating tablet    Sig: Take 1 tablet (8 mg total) by mouth every 8 (eight) hours as needed for nausea or vomiting.    Dispense:  20 tablet    Refill:  0  . promethazine (PHENERGAN) injection 50 mg    Sig:     Problem List Items Addressed This Visit    Norovirus    Phenergan given IM zofran prn Brat diet If symptoms do not improve --- go to ER      Relevant Medications   ondansetron (ZOFRAN ODT) 8 MG disintegrating tablet   promethazine (PHENERGAN) injection 50 mg (Completed)    Other Visit Diagnoses    Body aches    -  Primary    Relevant Orders    POCT Influenza A/B (Completed)       Follow-up: No Follow-up on file.  Garnet Koyanagi, DO

## 2015-11-04 NOTE — Assessment & Plan Note (Signed)
Phenergan given IM zofran prn Brat diet If symptoms do not improve --- go to ER

## 2015-11-12 ENCOUNTER — Ambulatory Visit: Payer: 59 | Admitting: Family

## 2015-12-24 ENCOUNTER — Ambulatory Visit: Payer: 59 | Admitting: Internal Medicine

## 2015-12-26 ENCOUNTER — Emergency Department (HOSPITAL_COMMUNITY)
Admission: EM | Admit: 2015-12-26 | Discharge: 2015-12-26 | Disposition: A | Discharge status: Home / Self Care | Payer: 59 | Attending: Emergency Medicine | Admitting: Emergency Medicine

## 2015-12-26 ENCOUNTER — Encounter (HOSPITAL_COMMUNITY): Payer: Self-pay | Admitting: Emergency Medicine

## 2015-12-26 DIAGNOSIS — R231 Pallor: Secondary | ICD-10-CM | POA: Diagnosis not present

## 2015-12-26 DIAGNOSIS — E119 Type 2 diabetes mellitus without complications: Secondary | ICD-10-CM | POA: Insufficient documentation

## 2015-12-26 DIAGNOSIS — E669 Obesity, unspecified: Secondary | ICD-10-CM | POA: Diagnosis not present

## 2015-12-26 DIAGNOSIS — I10 Essential (primary) hypertension: Secondary | ICD-10-CM | POA: Insufficient documentation

## 2015-12-26 DIAGNOSIS — R000 Tachycardia, unspecified: Secondary | ICD-10-CM | POA: Insufficient documentation

## 2015-12-26 DIAGNOSIS — Z7984 Long term (current) use of oral hypoglycemic drugs: Secondary | ICD-10-CM | POA: Insufficient documentation

## 2015-12-26 DIAGNOSIS — Z79899 Other long term (current) drug therapy: Secondary | ICD-10-CM | POA: Insufficient documentation

## 2015-12-26 DIAGNOSIS — N390 Urinary tract infection, site not specified: Secondary | ICD-10-CM | POA: Diagnosis not present

## 2015-12-26 DIAGNOSIS — Z86010 Personal history of colonic polyps: Secondary | ICD-10-CM | POA: Diagnosis not present

## 2015-12-26 DIAGNOSIS — R1012 Left upper quadrant pain: Secondary | ICD-10-CM | POA: Diagnosis present

## 2015-12-26 LAB — URINALYSIS, ROUTINE W REFLEX MICROSCOPIC
BILIRUBIN URINE: NEGATIVE
Glucose, UA: >1000 mg/dL — AB
Hgb urine dipstick: NEGATIVE
KETONES UR: NEGATIVE mg/dL
NITRITE: POSITIVE — AB
PH: 6 (ref 5.0–8.0)
Protein, ur: NEGATIVE mg/dL
SPECIFIC GRAVITY, URINE: 1.027 (ref 1.005–1.030)

## 2015-12-26 LAB — CBC WITH DIFFERENTIAL/PLATELET
BASOS PCT: 0 %
Basophils Absolute: 0 10*3/uL (ref 0.0–0.1)
EOS ABS: 0.1 10*3/uL (ref 0.0–0.7)
EOS PCT: 1 %
HCT: 41.4 % (ref 36.0–46.0)
Hemoglobin: 13.5 g/dL (ref 12.0–15.0)
Lymphocytes Relative: 22 %
Lymphs Abs: 1.8 10*3/uL (ref 0.7–4.0)
MCH: 25.4 pg — AB (ref 26.0–34.0)
MCHC: 32.6 g/dL (ref 30.0–36.0)
MCV: 78 fL (ref 78.0–100.0)
MONO ABS: 0.5 10*3/uL (ref 0.1–1.0)
MONOS PCT: 6 %
NEUTROS PCT: 72 %
Neutro Abs: 5.8 10*3/uL (ref 1.7–7.7)
Platelets: 215 10*3/uL (ref 150–400)
RBC: 5.31 MIL/uL — ABNORMAL HIGH (ref 3.87–5.11)
RDW: 15.5 % (ref 11.5–15.5)
WBC: 8.2 10*3/uL (ref 4.0–10.5)

## 2015-12-26 LAB — URINE MICROSCOPIC-ADD ON

## 2015-12-26 LAB — COMPREHENSIVE METABOLIC PANEL
ALBUMIN: 3.8 g/dL (ref 3.5–5.0)
ALT: 49 U/L (ref 14–54)
ANION GAP: 12 (ref 5–15)
AST: 29 U/L (ref 15–41)
Alkaline Phosphatase: 66 U/L (ref 38–126)
BUN: 9 mg/dL (ref 6–20)
CO2: 24 mmol/L (ref 22–32)
Calcium: 9.5 mg/dL (ref 8.9–10.3)
Chloride: 103 mmol/L (ref 101–111)
Creatinine, Ser: 0.65 mg/dL (ref 0.44–1.00)
GFR calc non Af Amer: >60 mL/min (ref >60)
GLUCOSE: 136 mg/dL — AB (ref 65–99)
POTASSIUM: 3.9 mmol/L (ref 3.5–5.1)
SODIUM: 139 mmol/L (ref 135–145)
TOTAL PROTEIN: 6.9 g/dL (ref 6.5–8.1)
Total Bilirubin: 0.3 mg/dL (ref 0.3–1.2)

## 2015-12-26 LAB — LIPASE, BLOOD: Lipase: 28 U/L (ref 11–51)

## 2015-12-26 MED ORDER — MORPHINE SULFATE (PF) 2 MG/ML IV SOLN
2.0000 mg | Freq: Once | INTRAVENOUS | Status: AC
  Administered 2015-12-26: 2 mg via INTRAVENOUS

## 2015-12-26 MED ORDER — CEPHALEXIN 500 MG PO CAPS
500.0000 mg | ORAL_CAPSULE | Freq: Three times a day (TID) | ORAL | Status: DC
Start: 2015-12-26 — End: 2016-02-05

## 2015-12-26 MED ORDER — ACETAMINOPHEN 325 MG PO TABS: 650.0000 mg | ORAL_TABLET | Freq: Four times a day (QID) | ORAL | Status: DC | PRN

## 2015-12-26 MED ORDER — ACETAMINOPHEN 325 MG PO TABS
650.0000 mg | ORAL_TABLET | Freq: Once | ORAL | Status: AC
  Administered 2015-12-26: 650 mg via ORAL
  Filled 2015-12-26: qty 2

## 2015-12-26 MED ORDER — SODIUM CHLORIDE 0.9 % IV BOLUS (SEPSIS)
500.0000 mL | Freq: Once | INTRAVENOUS | Status: AC
  Administered 2015-12-26: 500 mL via INTRAVENOUS

## 2015-12-26 MED ORDER — MORPHINE SULFATE (PF) 4 MG/ML IV SOLN
4.0000 mg | Freq: Once | INTRAVENOUS | Status: DC
  Filled 2015-12-26: qty 1

## 2015-12-26 MED ORDER — ONDANSETRON HCL 4 MG/2ML IJ SOLN
4.0000 mg | Freq: Once | INTRAMUSCULAR | Status: AC
  Administered 2015-12-26: 4 mg via INTRAVENOUS
  Filled 2015-12-26: qty 2

## 2015-12-26 NOTE — Discharge Instructions (Signed)
You were seen in the ER today for evaluation of abdominal pain. Your urine does show sign of infection. I will give you a prescription for antibiotics and tylenol for pain. Please follow up with your primary care provider within one week. Return to the ER for new or worsening symptoms.   Urinary Tract Infection Urinary tract infections (UTIs) can develop anywhere along your urinary tract. Your urinary tract is your body's drainage system for removing wastes and extra water. Your urinary tract includes two kidneys, two ureters, a bladder, and a urethra. Your kidneys are a pair of bean-shaped organs. Each kidney is about the size of your fist. They are located below your ribs, one on each side of your spine. CAUSES Infections are caused by microbes, which are microscopic organisms, including fungi, viruses, and bacteria. These organisms are so small that they can only be seen through a microscope. Bacteria are the microbes that most commonly cause UTIs. SYMPTOMS  Symptoms of UTIs may vary by age and gender of the patient and by the location of the infection. Symptoms in young women typically include a frequent and intense urge to urinate and a painful, burning feeling in the bladder or urethra during urination. Older women and men are more likely to be tired, shaky, and weak and have muscle aches and abdominal pain. A fever may mean the infection is in your kidneys. Other symptoms of a kidney infection include pain in your back or sides below the ribs, nausea, and vomiting. DIAGNOSIS To diagnose a UTI, your caregiver will ask you about your symptoms. Your caregiver will also ask you to provide a urine sample. The urine sample will be tested for bacteria and white blood cells. White blood cells are made by your body to help fight infection. TREATMENT  Typically, UTIs can be treated with medication. Because most UTIs are caused by a bacterial infection, they usually can be treated with the use of  antibiotics. The choice of antibiotic and length of treatment depend on your symptoms and the type of bacteria causing your infection. HOME CARE INSTRUCTIONS  If you were prescribed antibiotics, take them exactly as your caregiver instructs you. Finish the medication even if you feel better after you have only taken some of the medication.  Drink enough water and fluids to keep your urine clear or pale yellow.  Avoid caffeine, tea, and carbonated beverages. They tend to irritate your bladder.  Empty your bladder often. Avoid holding urine for long periods of time.  Empty your bladder before and after sexual intercourse.  After a bowel movement, women should cleanse from front to back. Use each tissue only once. SEEK MEDICAL CARE IF:   You have back pain.  You develop a fever.  Your symptoms do not begin to resolve within 3 days. SEEK IMMEDIATE MEDICAL CARE IF:   You have severe back pain or lower abdominal pain.  You develop chills.  You have nausea or vomiting.  You have continued burning or discomfort with urination. MAKE SURE YOU:   Understand these instructions.  Will watch your condition.  Will get help right away if you are not doing well or get worse.   This information is not intended to replace advice given to you by your health care provider. Make sure you discuss any questions you have with your health care provider.   Document Released: 07/23/2005 Document Revised: 07/04/2015 Document Reviewed: 11/21/2011 Elsevier Interactive Patient Education [employer].

## 2015-12-26 NOTE — ED Notes (Signed)
Pt presents to ER from home with LLQ pain and Left lower back/flank pain; pt denies urinary symptoms; pt denies vomiting; pt reports some nausea; pt reports diaphoresis x 2 wks;

## 2015-12-26 NOTE — ED Provider Notes (Signed)
CSN: [id number]     Arrival date & time 12/26/15  0539 History   First MD Initiated Contact with Patient 12/26/15 [phone]     Chief Complaint  Patient presents with  . Abdominal Pain  . Flank Pain   HPI   Angela Barr is an 55 y.o. female with history of DM, HTN, HLD who presents to the ED for evaluation of left sided abdominal pain and flank pain. She states her pain began about 4:30 PM yesterday. She reports a sudden onset, 7/10 constant sharp pain that starts in her LUQ/LLQ and radiates to her left flank. She states she has had a kidney stone in the past and this pain feels different. She denies nausea, vomiting, diarrhea. Denies dysuria. She states she typically has frequent urination due to her DM meds so difficult to say if increased urinary frequency from baseline. Denies vaginal complaints. Denies fever, chills, chest pain, SOB. She has not tried anything to alleviate her symptoms. Has had 2 formed BM since yesterday. States nothing like this has ever happened before. Denies EtOH use. Reports she still has her gallbladder and appendix.  Past Medical History  Diagnosis Date  . Diabetes mellitus 2006  . Hypertension 2012  . Colon polyps 2008    [name]  . Hyperlipemia 2012  . Obesity    Past Surgical History  Procedure Laterality Date  . Vaginal hysterectomy  2006  . Breast biopsy  2003  . Tubal ligation  1990   Family History  Problem Relation Age of Onset  . Heart failure Father   . Hypertension Mother   . Hypertension Father   . Colon cancer Father 79  . Breast cancer Maternal Grandmother   . Diabetes Father   . Diabetes Paternal Grandmother    Social History  Substance Use Topics  . Smoking status: Never Smoker   . Smokeless tobacco: Never Used  . Alcohol Use: No   OB History    No data available     Review of Systems  All other systems reviewed and are negative.     Allergies  Bactrim  Home Medications   Prior to Admission medications    Medication Sig Start Date End Date Taking? Authorizing Provider  canagliflozin (INVOKANA) 100 MG TABS tablet Take 1 tablet (100 mg total) by mouth daily. 06/25/15   Philemon Kingdom, MD  glimepiride (AMARYL) 4 MG tablet Take 1 tablet (4 mg total) by mouth daily with supper. 06/25/15   Philemon Kingdom, MD  glucose blood (ONETOUCH VERIO) test strip Check your blood sugar twice daily 04/13/15   Rosalita Chessman, DO  metFORMIN (GLUCOPHAGE-XR) 500 MG 24 hr tablet Take 500 mg in am and 1000 mg with dinner 08/16/15   Alferd Apa Lowne, DO  ondansetron (ZOFRAN ODT) 8 MG disintegrating tablet Take 1 tablet (8 mg total) by mouth every 8 (eight) hours as needed for nausea or vomiting. 11/02/15   Yvonne R Lowne, DO   BP 144/90 mmHg  Pulse 102  Temp(Src) 98.6 F (37 C) (Oral)  Resp 16  SpO2 97% Physical Exam  Constitutional: She is oriented to person, place, and time.  Obese, Pale, uncomfortable, NAD  HENT:  Right Ear: External ear normal.  Left Ear: External ear normal.  Nose: Nose normal.  Mouth/Throat: Oropharynx is clear and moist. Mucous membranes are dry. No oropharyngeal exudate.  Eyes: Conjunctivae are normal. Pupils are equal, round, and reactive to light.  Neck: Normal range of motion. Neck supple.  Cardiovascular: Regular  rhythm, normal heart sounds and intact distal pulses.  Tachycardia present.   Pulmonary/Chest: Effort normal and breath sounds normal. No respiratory distress. She has no wheezes. She exhibits no tenderness.  Abdominal: Soft. Bowel sounds are normal. She exhibits no distension.  +LUQ and LLQ tenderness, LUQ > LLQ. +L CVA tenderness. Abdomen is soft, no guarding, no rebound.   Musculoskeletal: She exhibits no edema.  Lymphadenopathy:    She has no cervical adenopathy.  Neurological: She is alert and oriented to person, place, and time. No cranial nerve deficit.  Skin: Skin is warm and dry. There is pallor.  Psychiatric: She has a normal mood and affect.  Nursing note and  vitals reviewed.  Filed Vitals:   12/26/15 0715 12/26/15 0730 12/26/15 0745 12/26/15 0759  BP: 134/95 126/81 132/83   Pulse: 95 93 97   Temp:    98 F (36.7 C)  TempSrc:    Oral  Resp:   16   SpO2: 97% 96% 98%      ED Course  Procedures (including critical care time) Labs Review Labs Reviewed  CBC WITH DIFFERENTIAL/PLATELET - Abnormal; Notable for the following:    RBC 5.31 (*)    MCH 25.4 (*)    All other components within normal limits  COMPREHENSIVE METABOLIC PANEL - Abnormal; Notable for the following:    Glucose, Bld 136 (*)    All other components within normal limits  URINALYSIS, ROUTINE W REFLEX MICROSCOPIC (NOT AT [facility]) - Abnormal; Notable for the following:    APPearance CLOUDY (*)    Glucose, UA >1000 (*)    Nitrite POSITIVE (*)    Leukocytes, UA SMALL (*)    All other components within normal limits  URINE MICROSCOPIC-ADD ON - Abnormal; Notable for the following:    Squamous Epithelial / LPF 0-5 (*)    Bacteria, UA MANY (*)    All other components within normal limits  URINE CULTURE  LIPASE, BLOOD    Imaging Review No results found. I have personally reviewed and evaluated these images and lab results as part of my medical decision-making.   EKG Interpretation None      MDM   Final diagnoses:  Urinary tract infection without hematuria, site unspecified    Will start with labs including CBC, CMP, Lipase, UA. Pt is s/p hysterectomy. Exam with LUQ tenderness > LLQ, with L CVA tenderness. Give pain meds. Pt mildly tachycardic that is likely due to pt's pain. Given clinical appearance and tenderness on exam I suspect will obtain abdominal imaging, but will wait for labs to result.   CBC, CMP, lipase unremarkable. UA nitrite positive with many bacteria, small leuks. I suspect pt's underlying UTI is causing her abdominal and flank pain. She does have urinary frequency though with her DM it is difficult to tell if it is new/different. Discussed findings  with pt. Will treat UTI with keflex and send urine for culture. Will hold off on abdominal imaging at this time. Pt instructed to f/u with PCP with ER return precautions. She is otherwise stable and nontoxic, stable for discharge. Pt verbalized her agreement with plan.   Anne Ng, PA-C 12/26/15 1527  Ripley Fraise, MD 12/26/15 [phone]

## 2015-12-27 LAB — URINE CULTURE

## 2016-01-10 ENCOUNTER — Other Ambulatory Visit: Payer: Self-pay

## 2016-01-10 DIAGNOSIS — Z1231 Encounter for screening mammogram for malignant neoplasm of breast: Secondary | ICD-10-CM

## 2016-01-11 ENCOUNTER — Other Ambulatory Visit: Payer: Self-pay | Admitting: Family Medicine

## 2016-01-11 ENCOUNTER — Encounter: Payer: Self-pay | Admitting: Family Medicine

## 2016-01-11 DIAGNOSIS — N760 Acute vaginitis: Secondary | ICD-10-CM

## 2016-01-11 MED ORDER — FLUCONAZOLE 150 MG PO TABS: ORAL_TABLET | ORAL | Status: DC

## 2016-01-18 ENCOUNTER — Telehealth: Payer: Self-pay | Admitting: Internal Medicine

## 2016-01-18 ENCOUNTER — Other Ambulatory Visit: Payer: Self-pay | Admitting: *Deleted

## 2016-01-18 ENCOUNTER — Encounter: Payer: Self-pay | Admitting: Internal Medicine

## 2016-01-18 DIAGNOSIS — N760 Acute vaginitis: Secondary | ICD-10-CM

## 2016-01-18 MED ORDER — CANAGLIFLOZIN 100 MG PO TABS: 100.0000 mg | ORAL_TABLET | Freq: Every day | ORAL | Status: DC

## 2016-01-18 MED ORDER — FLUCONAZOLE 150 MG PO TABS: ORAL_TABLET | ORAL | Status: DC

## 2016-01-18 NOTE — Telephone Encounter (Signed)
Please read message below and advise. I am unsure what this patient means.

## 2016-01-18 NOTE — Telephone Encounter (Signed)
Pt said she has 2 invokana pills left and wants to know if there should be more called in or if she should d/c taking the medication

## 2016-01-18 NOTE — Telephone Encounter (Signed)
See separate msg.

## 2016-01-18 NOTE — Telephone Encounter (Signed)
I have reviewed the separate messages with patient (via [place]) and Dr Etter Sjogren; and the MyChart message for today to Dr Cruzita Lederer. There is no resolution to any of the questions concerning the question. I will refill pt's Invokana until she sees Dr Cruzita Lederer.

## 2016-01-29 ENCOUNTER — Encounter: Payer: Self-pay | Admitting: Family Medicine

## 2016-01-29 ENCOUNTER — Ambulatory Visit: Payer: 59 | Admitting: Family Medicine

## 2016-02-04 ENCOUNTER — Encounter: Payer: Self-pay | Admitting: Family Medicine

## 2016-02-04 ENCOUNTER — Telehealth: Payer: Self-pay

## 2016-02-04 ENCOUNTER — Ambulatory Visit
Admission: RE | Admit: 2016-02-04 | Discharge: 2016-02-04 | Disposition: A | Discharge status: Home / Self Care | Payer: 59 | Source: Ambulatory Visit

## 2016-02-04 DIAGNOSIS — Z1231 Encounter for screening mammogram for malignant neoplasm of breast: Secondary | ICD-10-CM

## 2016-02-04 NOTE — Telephone Encounter (Signed)
Encounter Messages  Expand AllCollapse All     Non-Urgent Medical Question     From   [facility]    To   [name], DO    Sent   02/04/2016 12:33 PM       Dr Etter Sjogren having headaches again today. My blood pressure is 146/100 and my pulse is 86. My blood sugar is 139.  I had a headache last night around 2:30 am and it is still here. My face feel like it is full. No tingling noted.  Please advise.            Encounter Diagnoses and Associated Orders     There are no diagnoses or orders entered for this encounter.     Notes      [name], DO at 02/04/2016 1:49 PM     Status: Signed       Expand All Collapse All   Needs ov       Pt scheduled for appt tomorrow at 9 am.

## 2016-02-04 NOTE — Telephone Encounter (Signed)
Needs ov

## 2016-02-04 NOTE — Telephone Encounter (Signed)
She needs ov 

## 2016-02-05 ENCOUNTER — Other Ambulatory Visit (INDEPENDENT_AMBULATORY_CARE_PROVIDER_SITE_OTHER): Payer: 59 | Admitting: *Deleted

## 2016-02-05 ENCOUNTER — Ambulatory Visit (INDEPENDENT_AMBULATORY_CARE_PROVIDER_SITE_OTHER): Payer: 59 | Admitting: Family Medicine

## 2016-02-05 ENCOUNTER — Ambulatory Visit (INDEPENDENT_AMBULATORY_CARE_PROVIDER_SITE_OTHER): Payer: 59 | Admitting: Internal Medicine

## 2016-02-05 ENCOUNTER — Encounter: Payer: Self-pay | Admitting: Internal Medicine

## 2016-02-05 ENCOUNTER — Encounter: Payer: Self-pay | Admitting: Family Medicine

## 2016-02-05 VITALS — BP 122/78 | HR 94 | Temp 97.4°F | Resp 12 | Wt 238.0 lb

## 2016-02-05 VITALS — BP 124/86 | HR 88 | Temp 98.4°F | Ht 65.0 in | Wt 236.0 lb

## 2016-02-05 DIAGNOSIS — I10 Essential (primary) hypertension: Secondary | ICD-10-CM

## 2016-02-05 DIAGNOSIS — E1165 Type 2 diabetes mellitus with hyperglycemia: Secondary | ICD-10-CM

## 2016-02-05 DIAGNOSIS — Z114 Encounter for screening for human immunodeficiency virus [HIV]: Secondary | ICD-10-CM | POA: Diagnosis not present

## 2016-02-05 DIAGNOSIS — Z1159 Encounter for screening for other viral diseases: Secondary | ICD-10-CM | POA: Diagnosis not present

## 2016-02-05 DIAGNOSIS — E1310 Other specified diabetes mellitus with ketoacidosis without coma: Secondary | ICD-10-CM | POA: Diagnosis not present

## 2016-02-05 DIAGNOSIS — J3489 Other specified disorders of nose and nasal sinuses: Secondary | ICD-10-CM

## 2016-02-05 DIAGNOSIS — R8290 Unspecified abnormal findings in urine: Secondary | ICD-10-CM

## 2016-02-05 DIAGNOSIS — E1110 Type 2 diabetes mellitus with ketoacidosis without coma: Secondary | ICD-10-CM

## 2016-02-05 LAB — CBC WITH DIFFERENTIAL/PLATELET
BASOS PCT: 0.9 % (ref 0.0–3.0)
Basophils Absolute: 0 10*3/uL (ref 0.0–0.1)
EOS ABS: 0.1 10*3/uL (ref 0.0–0.7)
EOS PCT: 2.6 % (ref 0.0–5.0)
HCT: 43.1 % (ref 36.0–46.0)
HEMOGLOBIN: 13.7 g/dL (ref 12.0–15.0)
Lymphocytes Relative: 38.1 % (ref 12.0–46.0)
Lymphs Abs: 1.6 10*3/uL (ref 0.7–4.0)
MCHC: 31.8 g/dL (ref 30.0–36.0)
MCV: 79.3 fl (ref 78.0–100.0)
MONO ABS: 0.3 10*3/uL (ref 0.1–1.0)
Monocytes Relative: 6.6 % (ref 3.0–12.0)
NEUTROS ABS: 2.2 10*3/uL (ref 1.4–7.7)
Neutrophils Relative %: 51.8 % (ref 43.0–77.0)
PLATELETS: 245 10*3/uL (ref 150.0–400.0)
RBC: 5.43 Mil/uL — ABNORMAL HIGH (ref 3.87–5.11)
RDW: 16.1 % — AB (ref 11.5–15.5)
WBC: 4.2 10*3/uL (ref 4.0–10.5)

## 2016-02-05 LAB — TSH: TSH: 0.66 u[IU]/mL (ref 0.35–4.50)

## 2016-02-05 LAB — POCT URINALYSIS DIPSTICK
Bilirubin, UA: NEGATIVE
Leukocytes, UA: NEGATIVE
NITRITE UA: POSITIVE
PROTEIN UA: NEGATIVE
RBC UA: NEGATIVE
SPEC GRAV UA: 1.02
UROBILINOGEN UA: NEGATIVE
pH, UA: 6

## 2016-02-05 LAB — COMPREHENSIVE METABOLIC PANEL
ALBUMIN: 4.3 g/dL (ref 3.5–5.2)
ALT: 43 U/L — AB (ref 0–35)
AST: 24 U/L (ref 0–37)
Alkaline Phosphatase: 58 U/L (ref 39–117)
BUN: 16 mg/dL (ref 6–23)
CALCIUM: 9.9 mg/dL (ref 8.4–10.5)
CHLORIDE: 104 meq/L (ref 96–112)
CO2: 28 meq/L (ref 19–32)
CREATININE: 0.66 mg/dL (ref 0.40–1.20)
GFR: 120.31 mL/min (ref >60.00)
GLUCOSE: 119 mg/dL — AB (ref 70–99)
POTASSIUM: 4 meq/L (ref 3.5–5.1)
Sodium: 139 mEq/L (ref 135–145)
TOTAL PROTEIN: 7.4 g/dL (ref 6.0–8.3)
Total Bilirubin: 0.4 mg/dL (ref 0.2–1.2)

## 2016-02-05 LAB — HIV ANTIBODY (ROUTINE TESTING W REFLEX): HIV 1&2 Ab, 4th Generation: NONREACTIVE

## 2016-02-05 LAB — MICROALBUMIN / CREATININE URINE RATIO
Creatinine,U: 125.8 mg/dL
Microalb Creat Ratio: 0.7 mg/g (ref 0.0–30.0)
Microalb, Ur: 0.9 mg/dL (ref 0.0–1.9)

## 2016-02-05 LAB — LIPID PANEL
CHOL/HDL RATIO: 4
CHOLESTEROL: 185 mg/dL (ref 0–200)
HDL: 50.8 mg/dL (ref >39.00)
LDL CALC: 122 mg/dL — AB (ref 0–99)
NonHDL: 134.08
TRIGLYCERIDES: 60 mg/dL (ref 0.0–149.0)
VLDL: 12 mg/dL (ref 0.0–40.0)

## 2016-02-05 LAB — HEPATITIS C ANTIBODY: HCV Ab: NEGATIVE

## 2016-02-05 LAB — POCT GLYCOSYLATED HEMOGLOBIN (HGB A1C): HEMOGLOBIN A1C: 7.1

## 2016-02-05 LAB — HEMOGLOBIN A1C: HEMOGLOBIN A1C: 7.5 % — AB (ref 4.6–6.5)

## 2016-02-05 MED ORDER — MUPIROCIN CALCIUM 2 % EX CREA: 1.0000 "application " | TOPICAL_CREAM | Freq: Two times a day (BID) | CUTANEOUS | Status: DC

## 2016-02-05 MED ORDER — GLIMEPIRIDE 4 MG PO TABS: 4.0000 mg | ORAL_TABLET | Freq: Every day | ORAL | Status: DC

## 2016-02-05 MED ORDER — DULAGLUTIDE 0.75 MG/0.5ML ~~LOC~~ SOAJ: SUBCUTANEOUS | Status: DC

## 2016-02-05 MED ORDER — HYDROCHLOROTHIAZIDE 25 MG PO TABS: 25.0000 mg | ORAL_TABLET | Freq: Every day | ORAL | Status: DC

## 2016-02-05 NOTE — Patient Instructions (Addendum)
Please continue: - Metformin XR [id number] mg  - Amaryl 4 mg before dinner - Invokana 100 mg daily in am  Please start Trulicity A999333 mg weekly.  Please schedule an appt with Antonieta Iba with nutrition.  Please return in 3 months with your sugar log.

## 2016-02-05 NOTE — Progress Notes (Signed)
Patient ID: Kaylon Tajima, female    DOB: 1961/10/27  Age: 55 y.o. MRN: [id number]    Subjective:  Subjective HPI Fey Dorsi presents for elevated bp (much higher than today) and headaches x last few-- blood sugars are running high as well.  She c/o leg swelling esp by end of day.    Review of Systems  Constitutional: Negative for diaphoresis, appetite change, fatigue and unexpected weight change.  Eyes: Negative for pain, redness and visual disturbance.  Respiratory: Negative for cough, chest tightness, shortness of breath and wheezing.   Cardiovascular: Positive for leg swelling. Negative for chest pain and palpitations.  Endocrine: Negative for cold intolerance, heat intolerance, polydipsia, polyphagia and polyuria.  Genitourinary: Negative for dysuria, frequency and difficulty urinating.  Neurological: Negative for dizziness, light-headedness, numbness and headaches.    History Past Medical History  Diagnosis Date  . Diabetes mellitus 2006  . Hypertension 2012  . Colon polyps 2008    [name]  . Hyperlipemia 2012  . Obesity     She has past surgical history that includes Vaginal hysterectomy (2006); Breast biopsy (2003); and Tubal ligation (1990).   Her family history includes Breast cancer in her maternal grandmother; Colon cancer (age of onset: 48) in her father; Diabetes in her father and paternal grandmother; Heart failure in her father; Hypertension in her father and mother.She reports that she has never smoked. She has never used smokeless tobacco. She reports that she does not drink alcohol or use illicit drugs.  Current Outpatient Prescriptions on File Prior to Visit  Medication Sig Dispense Refill  . acetaminophen (TYLENOL) 325 MG tablet Take 2 tablets (650 mg total) by mouth every 6 (six) hours as needed. 30 tablet 0  . canagliflozin (INVOKANA) 100 MG TABS tablet Take 1 tablet (100 mg total) by mouth daily. 30 tablet 1  . fluconazole (DIFLUCAN)  150 MG tablet 1 po qd x1, may repeat in 3 days prn 2 tablet 1  . glucose blood (ONETOUCH VERIO) test strip Check your blood sugar twice daily 100 each 12  . metFORMIN (GLUCOPHAGE-XR) 500 MG 24 hr tablet Take 500 mg in am and 1000 mg with dinner 120 tablet 5  . Dulaglutide (TRULICITY) [id number] 0000000 SOPN Please inject 0.75 mg weekly 4 pen 1  . glimepiride (AMARYL) 4 MG tablet Take 1 tablet (4 mg total) by mouth daily with supper. 90 tablet 1  . ondansetron (ZOFRAN ODT) 8 MG disintegrating tablet Take 1 tablet (8 mg total) by mouth every 8 (eight) hours as needed for nausea or vomiting. 20 tablet 0   No current facility-administered medications on file prior to visit.     Objective:  Objective Physical Exam  Constitutional: She is oriented to person, place, and time. She appears well-developed and well-nourished.  HENT:  Head: Normocephalic and atraumatic.  Eyes: Conjunctivae and EOM are normal.  Neck: Normal range of motion. Neck supple. No JVD present. Carotid bruit is not present. No thyromegaly present.  Cardiovascular: Normal rate, regular rhythm and normal heart sounds.   No murmur heard. Pulmonary/Chest: Effort normal and breath sounds normal. No respiratory distress. She has no wheezes. She has no rales. She exhibits no tenderness.  Musculoskeletal: She exhibits edema.  Neurological: She is alert and oriented to person, place, and time.  Psychiatric: She has a normal mood and affect. Her behavior is normal.  Nursing note and vitals reviewed.  BP 124/86 mmHg  Pulse 88  Temp(Src) 98.4 F (36.9 C) (Oral)  Ht 5\' 5"  (  1.651 m)  Wt 236 lb (107.049 kg)  BMI 39.27 kg/m2  SpO2 98% Wt Readings from Last 3 Encounters:  02/05/16 238 lb (107.956 kg)  02/05/16 236 lb (107.049 kg)  11/02/15 237 lb 9.6 oz (107.775 kg)     Lab Results  Component Value Date   WBC 4.2 02/05/2016   HGB 13.7 02/05/2016   HCT 43.1 02/05/2016   PLT 245.0 02/05/2016   GLUCOSE 119* 02/05/2016   CHOL 185  02/05/2016   TRIG 60.0 02/05/2016   HDL 50.80 02/05/2016   LDLCALC 122* 02/05/2016   ALT 43* 02/05/2016   AST 24 02/05/2016   NA 139 02/05/2016   K 4.0 02/05/2016   CL 104 02/05/2016   CREATININE 0.66 02/05/2016   BUN 16 02/05/2016   CO2 28 02/05/2016   TSH 0.66 02/05/2016   HGBA1C 7.5* 02/05/2016   MICROALBUR 0.9 02/05/2016    Mm Screening Breast Tomo Bilateral  02/05/2016  CLINICAL DATA:  Screening. EXAM: 2D DIGITAL SCREENING BILATERAL MAMMOGRAM WITH CAD AND ADJUNCT TOMO COMPARISON:  Previous exam(s). ACR Breast Density Category c: The breast tissue is heterogeneously dense, which may obscure small masses. FINDINGS: There are no findings suspicious for malignancy. Images were processed with CAD. IMPRESSION: No mammographic evidence of malignancy. A result letter of this screening mammogram will be mailed directly to the patient. RECOMMENDATION: Screening mammogram in one year. (Code:SM-B-01Y) BI-RADS CATEGORY  1: Negative. Electronically Signed   By: Dorise Bullion III M.D   On: 02/05/2016 07:24     Assessment & Plan:  Plan I have discontinued Ms. Padget's cephALEXin. I am also having her start on hydrochlorothiazide and mupirocin cream. Additionally, I am having her maintain her glucose blood, metFORMIN, ondansetron, acetaminophen, canagliflozin, and fluconazole.  Meds ordered this encounter  Medications  . hydrochlorothiazide (HYDRODIURIL) 25 MG tablet    Sig: Take 1 tablet (25 mg total) by mouth daily. 1 po qd prn    Dispense:  30 tablet    Refill:  11  . mupirocin cream (BACTROBAN) 2 %    Sig: Apply 1 application topically 2 (two) times daily.    Dispense:  15 g    Refill:  0    Problem List Items Addressed This Visit      Unprioritized   Type 2 diabetes mellitus with hyperglycemia (HCC) (Chronic)    Discussed diet and exercise Check labs      HTN (hypertension) - Primary    Slightly elevated Try hctz daily Recheck 2-3 weeks      Relevant Medications    hydrochlorothiazide (HYDRODIURIL) 25 MG tablet   Other Relevant Orders   Comprehensive metabolic panel (Completed)   CBC with Differential/Platelet (Completed)   Lipid panel (Completed)   POCT urinalysis dipstick (Completed)   TSH (Completed)    Other Visit Diagnoses    Uncontrolled type 2 diabetes mellitus with ketoacidosis without coma, without long-term current use of insulin (HCC)        Relevant Orders    Hemoglobin A1c (Completed)    Microalbumin / creatinine urine ratio (Completed)    Need for hepatitis C screening test        Relevant Orders    Hepatitis C antibody (Completed)    Encounter for screening for HIV        Relevant Orders    HIV antibody (Completed)    Nasal sore        Relevant Medications    mupirocin cream (BACTROBAN) 2 %    Abnormal urine  Relevant Orders    Urine Culture       Follow-up: Return in about 3 weeks (around 02/26/2016), or if symptoms worsen or fail to improve, for hypertension.  [name], DO

## 2016-02-05 NOTE — Progress Notes (Deleted)
.   Subjective:    Patient ID: Angela Barr, female    DOB: Dec 09, 1960, 55 y.o.   MRN: [id number]  Chief Complaint  Patient presents with  . Headache    bp elevated, and blood sugar concerns x 3 days    HPI Patient is in today for concerns with headache and blood pressure.   Past Medical History  Diagnosis Date  . Diabetes mellitus 2006  . Hypertension 2012  . Colon polyps 2008    [name]  . Hyperlipemia 2012  . Obesity     Past Surgical History  Procedure Laterality Date  . Vaginal hysterectomy  2006  . Breast biopsy  2003  . Tubal ligation  1990    Family History  Problem Relation Age of Onset  . Heart failure Father   . Hypertension Mother   . Hypertension Father   . Colon cancer Father 83  . Breast cancer Maternal Grandmother   . Diabetes Father   . Diabetes Paternal Grandmother     Social History   Social History  . Marital Status: Married    Spouse Name: N/A  . Number of Children: 3  . Years of Education: N/A   Occupational History  . RN    Social History Main Topics  . Smoking status: Never Smoker   . Smokeless tobacco: Never Used  . Alcohol Use: No  . Drug Use: No  . Sexual Activity: Not on file   Other Topics Concern  . Not on file   Social History Narrative   3 caffeine drinks daily     Outpatient Prescriptions Prior to Visit  Medication Sig Dispense Refill  . acetaminophen (TYLENOL) 325 MG tablet Take 2 tablets (650 mg total) by mouth every 6 (six) hours as needed. 30 tablet 0  . canagliflozin (INVOKANA) 100 MG TABS tablet Take 1 tablet (100 mg total) by mouth daily. 30 tablet 1  . fluconazole (DIFLUCAN) 150 MG tablet 1 po qd x1, may repeat in 3 days prn 2 tablet 1  . glimepiride (AMARYL) 4 MG tablet Take 1 tablet (4 mg total) by mouth daily with supper. 90 tablet 1  . glucose blood (ONETOUCH VERIO) test strip Check your blood sugar twice daily 100 each 12  . metFORMIN (GLUCOPHAGE-XR) 500 MG 24 hr tablet Take 500  mg in am and 1000 mg with dinner 120 tablet 5  . cephALEXin (KEFLEX) 500 MG capsule Take 1 capsule (500 mg total) by mouth 3 (three) times daily. 21 capsule 0  . ondansetron (ZOFRAN ODT) 8 MG disintegrating tablet Take 1 tablet (8 mg total) by mouth every 8 (eight) hours as needed for nausea or vomiting. (Patient not taking: Reported on 02/05/2016) 20 tablet 0   No facility-administered medications prior to visit.    Allergies  Allergen Reactions  . Bactrim Nausea Only    ROS     Objective:    Physical Exam  BP 124/86 mmHg  Pulse 88  Temp(Src) 98.4 F (36.9 C) (Oral)  Ht 5\' 5"  (1.651 m)  Wt 236 lb (107.049 kg)  BMI 39.27 kg/m2  SpO2 98% Wt Readings from Last 3 Encounters:  02/05/16 236 lb (107.049 kg)  11/02/15 237 lb 9.6 oz (107.775 kg)  09/25/15 237 lb (107.502 kg)     Lab Results  Component Value Date   WBC 8.2 12/26/2015   HGB 13.5 12/26/2015   HCT 41.4 12/26/2015   PLT 215 12/26/2015   GLUCOSE 136* 12/26/2015   CHOL 176  12/25/2014   TRIG 76.0 12/25/2014   HDL 47.10 12/25/2014   LDLCALC 114* 12/25/2014   ALT 49 12/26/2015   AST 29 12/26/2015   NA 139 12/26/2015   K 3.9 12/26/2015   CL 103 12/26/2015   CREATININE 0.65 12/26/2015   BUN 9 12/26/2015   CO2 24 12/26/2015   TSH 0.384 12/25/2014   HGBA1C 7.7 06/25/2015   MICROALBUR 2.0* 12/25/2014    Lab Results  Component Value Date   TSH 0.384 12/25/2014   Lab Results  Component Value Date   WBC 8.2 12/26/2015   HGB 13.5 12/26/2015   HCT 41.4 12/26/2015   MCV 78.0 12/26/2015   PLT 215 12/26/2015   Lab Results  Component Value Date   NA 139 12/26/2015   K 3.9 12/26/2015   CO2 24 12/26/2015   GLUCOSE 136* 12/26/2015   BUN 9 12/26/2015   CREATININE 0.65 12/26/2015   BILITOT 0.3 12/26/2015   ALKPHOS 66 12/26/2015   AST 29 12/26/2015   ALT 49 12/26/2015   PROT 6.9 12/26/2015   ALBUMIN 3.8 12/26/2015   CALCIUM 9.5 12/26/2015   ANIONGAP 12 12/26/2015   GFR 129.87 12/25/2014   Lab Results    Component Value Date   CHOL 176 12/25/2014   Lab Results  Component Value Date   HDL 47.10 12/25/2014   Lab Results  Component Value Date   LDLCALC 114* 12/25/2014   Lab Results  Component Value Date   TRIG 76.0 12/25/2014   Lab Results  Component Value Date   CHOLHDL 4 12/25/2014   Lab Results  Component Value Date   HGBA1C 7.7 06/25/2015       Assessment & Plan:   Problem List Items Addressed This Visit    None      I have discontinued [name]'s cephALEXin. I am also having her maintain her glucose blood, glimepiride, metFORMIN, ondansetron, acetaminophen, canagliflozin, and fluconazole.  No orders of the defined types were placed in this encounter.     Jan Fireman, [place]

## 2016-02-05 NOTE — Progress Notes (Signed)
Pre visit review using our clinic review tool, if applicable. No additional management support is needed unless otherwise documented below in the visit note. 

## 2016-02-05 NOTE — Patient Instructions (Signed)
Hypertension Hypertension, commonly called high blood pressure, is when the force of blood pumping through your arteries is too strong. Your arteries are the blood vessels that carry blood from your heart throughout your body. A blood pressure reading consists of a higher number over a lower number, such as 110/72. The higher number (systolic) is the pressure inside your arteries when your heart pumps. The lower number (diastolic) is the pressure inside your arteries when your heart relaxes. Ideally you want your blood pressure below 120/80. Hypertension forces your heart to work harder to pump blood. Your arteries may become narrow or stiff. Having untreated or uncontrolled hypertension can cause heart attack, stroke, kidney disease, and other problems. RISK FACTORS Some risk factors for high blood pressure are controllable. Others are not.  Risk factors you cannot control include:   Race. You may be at higher risk if you are African American.  Age. Risk increases with age.  Gender. Men are at higher risk than women before age 45 years. After age 65, women are at higher risk than men. Risk factors you can control include:  Not getting enough exercise or physical activity.  Being overweight.  Getting too much fat, sugar, calories, or salt in your diet.  Drinking too much alcohol. SIGNS AND SYMPTOMS Hypertension does not usually cause signs or symptoms. Extremely high blood pressure (hypertensive crisis) may cause headache, anxiety, shortness of breath, and nosebleed. DIAGNOSIS To check if you have hypertension, your health care provider will measure your blood pressure while you are seated, with your arm held at the level of your heart. It should be measured at least twice using the same arm. Certain conditions can cause a difference in blood pressure between your right and left arms. A blood pressure reading that is higher than normal on one occasion does not mean that you need treatment. If  it is not clear whether you have high blood pressure, you may be asked to return on a different day to have your blood pressure checked again. Or, you may be asked to monitor your blood pressure at home for 1 or more weeks. TREATMENT Treating high blood pressure includes making lifestyle changes and possibly taking medicine. Living a healthy lifestyle can help lower high blood pressure. You may need to change some of your habits. Lifestyle changes may include:  Following the DASH diet. This diet is high in fruits, vegetables, and whole grains. It is low in salt, red meat, and added sugars.  Keep your sodium intake below 2,300 mg per day.  Getting at least 30-45 minutes of aerobic exercise at least 4 times per week.  Losing weight if necessary.  Not smoking.  Limiting alcoholic beverages.  Learning ways to reduce stress. Your health care provider may prescribe medicine if lifestyle changes are not enough to get your blood pressure under control, and if one of the following is true:  You are 18-59 years of age and your systolic blood pressure is above 140.  You are 60 years of age or older, and your systolic blood pressure is above 150.  Your diastolic blood pressure is above 90.  You have diabetes, and your systolic blood pressure is over 140 or your diastolic blood pressure is over 90.  You have kidney disease and your blood pressure is above 140/90.  You have heart disease and your blood pressure is above 140/90. Your personal target blood pressure may vary depending on your medical conditions, your age, and other factors. HOME CARE INSTRUCTIONS    Have your blood pressure rechecked as directed by your health care provider.   Take medicines only as directed by your health care provider. Follow the directions carefully. Blood pressure medicines must be taken as prescribed. The medicine does not work as well when you skip doses. Skipping doses also puts you at risk for  problems.  Do not smoke.   Monitor your blood pressure at home as directed by your health care provider. SEEK MEDICAL CARE IF:   You think you are having a reaction to medicines taken.  You have recurrent headaches or feel dizzy.  You have swelling in your ankles.  You have trouble with your vision. SEEK IMMEDIATE MEDICAL CARE IF:  You develop a severe headache or confusion.  You have unusual weakness, numbness, or feel faint.  You have severe chest or abdominal pain.  You vomit repeatedly.  You have trouble breathing. MAKE SURE YOU:   Understand these instructions.  Will watch your condition.  Will get help right away if you are not doing well or get worse.   This information is not intended to replace advice given to you by your health care provider. Make sure you discuss any questions you have with your health care provider.   Document Released: 10/13/2005 Document Revised: 02/27/2015 Document Reviewed: 08/05/2013 Elsevier Interactive Patient Education 2016 Elsevier Inc.  

## 2016-02-05 NOTE — Progress Notes (Signed)
Patient ID: Angela Barr, female   DOB: Oct 03, 1961, 55 y.o.   MRN: [id number]  HPI: Angela Barr is a 55 y.o.-year-old female, returning for f/u for DM2, dx in 2007, non-insulin-dependent, uncontrolled, without complications. Last visit 5 mo ago.  She just started HCTZ by PCP today.   Last hemoglobin A1c was: 09/25/2015: HbA1c 7.1% Lab Results  Component Value Date   HGBA1C 7.5* 02/05/2016   HGBA1C 7.7 06/25/2015   HGBA1C 7.6* 03/23/2015   Pt is on a regimen of: - Metformin XR [phone] mg. She had diarrhea with the regular metformin. - Amaryl 4 mg in pm - Invokana 100 mg daily - added 01/22/2015. + yeast inf.'s She had a UTI >> Cipro x 3 days, not recently. She tried Victoza.  Pt checks her sugars 1x a day and they are better: - am: 120-150s (170-180's before Invokana) >> 117-150 (150 if eats dinner late) >> 170-190 >> 121-130 >> 119-139 - 2h after b'fast: n/c >> 97 >> n/c - before lunch: n/c >> 134 >> 110-128 >> 110s >> 110-118 - 2h after lunch: n/c >> 110-120 >> n/c - before dinner: n/c >> 68x1, 117 >> 140-150 >> 120-128 >> 160 (after snacks) - 2h after dinner: n/c >> 94-128 >> n/c >> 140s - bedtime: n/c  - nighttime: n/c No lows. Lowest sugar was 120s >> 68 >> 108 >> 71 >> 108; she has hypoglycemia awareness at 110.  Highest sugar was 280s >> 150 >> 203 >> 138 >> 164.  Glucometer: One Probation officer Flex   Pt's meals are: - Breakfast: bacon and sausage, scrambled eggs, wheat or rye toast - Lunch: salads, chicken, deli sandwich - Dinner: fish, chicken, eating out 5x/week - Snacks: butter cookies, chips Stopped drinking sweat tea! Eats sunflower seeds and nuts. Cuts out starches.  - no CKD, last BUN/creatinine:  Lab Results  Component Value Date   BUN 9 12/26/2015   CREATININE 0.65 12/26/2015  On Lisinopril. Component     Latest Ref Rng 01/02/2015  Protein, Urine     5 - 24 mg/dL 7  Protein, 24H Urine     <150 mg/day 126   Component     Latest Ref Rng  12/25/2014  Microalb, Ur     0.0 - 1.9 mg/dL 2.0 (H)  Creatinine,U      271.6  MICROALB/CREAT RATIO     0.0 - 30.0 mg/g 0.7   - we stopped Lisinopril since she had a low BP and felt dizzy at points throughout the day. ACR's >> not high in last 4 years. 24 h urine for proteins >> normal. No h/o HTN.  - last set of lipids: Lab Results  Component Value Date   CHOL 176 12/25/2014   HDL 47.10 12/25/2014   LDLCALC 114* 12/25/2014   TRIG 76.0 12/25/2014   CHOLHDL 4 12/25/2014  Not on Zocor in few months - she was forgetting it. - last eye exam was in 12/2014. No DR.  - no numbness and tingling in her feet.  ROS: Constitutional: no weight loss, no fatigue,  no nocturia, + feeling hot Eyes: no blurry vision, no xerophthalmia ENT: no sore throat, no nodules palpated in throat, no dysphagia/odynophagia, no hoarseness Cardiovascular: no CP/SOB/palpitations/+ leg swelling Respiratory: no cough/SOB Gastrointestinal: no N/V/D/C Musculoskeletal: no muscle/joint aches Skin: + rash, no hair loss Neurological: no tremors/numbness/tingling/dizziness  I reviewed pt's medications, allergies, PMH, social hx, family hx, and changes were documented in the history of present illness. Otherwise, unchanged from my initial  visit note.  Past Medical History  Diagnosis Date  . Diabetes mellitus 2006  . Hypertension 2012  . Colon polyps 2008    [name]  . Hyperlipemia 2012  . Obesity    Past Surgical History  Procedure Laterality Date  . Vaginal hysterectomy  2006  . Breast biopsy  2003  . Tubal ligation  1990   History   Social History  . Marital Status: Married    Spouse Name: N/A  . Number of Children: 3   Occupational History  . RN    Social History Main Topics  . Smoking status: Never Smoker   . Smokeless tobacco: Never Used  . Alcohol Use: No  . Drug Use: No   Social History Narrative   3 caffeine drinks daily    Current Outpatient Prescriptions on File Prior  to Visit  Medication Sig Dispense Refill  . acetaminophen (TYLENOL) 325 MG tablet Take 2 tablets (650 mg total) by mouth every 6 (six) hours as needed. 30 tablet 0  . canagliflozin (INVOKANA) 100 MG TABS tablet Take 1 tablet (100 mg total) by mouth daily. 30 tablet 1  . fluconazole (DIFLUCAN) 150 MG tablet 1 po qd x1, may repeat in 3 days prn 2 tablet 1  . glimepiride (AMARYL) 4 MG tablet Take 1 tablet (4 mg total) by mouth daily with supper. 90 tablet 1  . glucose blood (ONETOUCH VERIO) test strip Check your blood sugar twice daily 100 each 12  . metFORMIN (GLUCOPHAGE-XR) 500 MG 24 hr tablet Take 500 mg in am and 1000 mg with dinner 120 tablet 5  . ondansetron (ZOFRAN ODT) 8 MG disintegrating tablet Take 1 tablet (8 mg total) by mouth every 8 (eight) hours as needed for nausea or vomiting. 20 tablet 0   No current facility-administered medications on file prior to visit.   Allergies  Allergen Reactions  . Bactrim Nausea Only   Family History  Problem Relation Age of Onset  . Heart failure Father   . Hypertension Mother   . Hypertension Father   . Colon cancer Father 88  . Breast cancer Maternal Grandmother   . Diabetes Father   . Diabetes Paternal Grandmother    PE: BP 122/78 mmHg  Pulse 94  Temp(Src) 97.4 F (36.3 C) (Oral)  Resp 12  Wt 238 lb (107.956 kg)  SpO2 99% Body mass index is 39.61 kg/(m^2). Wt Readings from Last 3 Encounters:  02/05/16 238 lb (107.956 kg)  02/05/16 236 lb (107.049 kg)  11/02/15 237 lb 9.6 oz (107.775 kg)   Constitutional: obese, in NAD Eyes: PERRLA, EOMI, no exophthalmos ENT: moist mucous membranes, no thyromegaly, no cervical lymphadenopathy Cardiovascular: RRR, No MRG Respiratory: CTA B Gastrointestinal: abdomen soft, NT, ND, BS+ Musculoskeletal: no deformities, strength intact in all 4 Skin: moist, warm, no rashes Neurological: no tremor with outstretched hands, DTR normal in all 4  ASSESSMENT: 1. DM2, non-insulin-dependent,  uncontrolled, without complications  PLAN:  1. Patient with long-standing, uncontrolled diabetes, on oral antidiabetic regimen, with much improved sugars on Invokana, but with the price of yeast inf's. Sugars slightly higher. Will add Trulicity >> if sugars better, I suspect less yeast inf from Invokana. If they persist, we need to stop it. We also discussed about the need to improve her diet >> placed referral to nutrition. - I recommended:  Patient Instructions  Please continue: - Metformin XR [phone] mg  - Amaryl 4 mg before dinner - Invokana 100 mg daily in am  Please  start Trulicity [id number] mg weekly.  Please schedule an appt with Antonieta Iba with nutrition.  Please return in 3 months with your sugar log.    - continue checking sugars at different times of the day - check 1-2 times a day, rotating checks.  - advised for yearly eye exams >> she needs one - check HbA1c today >> 7.5% (increased!) - Return to clinic in 3 mo with sugar log

## 2016-02-06 NOTE — Assessment & Plan Note (Signed)
Discussed diet and exercise Check labs

## 2016-02-06 NOTE — Assessment & Plan Note (Signed)
Slightly elevated Try hctz daily Recheck 2-3 weeks

## 2016-02-07 LAB — URINE CULTURE: Colony Count: >=100000

## 2016-02-08 ENCOUNTER — Encounter: Payer: Self-pay | Admitting: Dietician

## 2016-02-08 ENCOUNTER — Encounter: Payer: 59 | Attending: Internal Medicine | Admitting: Dietician

## 2016-02-08 VITALS — Ht 65.0 in | Wt 235.0 lb

## 2016-02-08 DIAGNOSIS — E1165 Type 2 diabetes mellitus with hyperglycemia: Secondary | ICD-10-CM | POA: Insufficient documentation

## 2016-02-08 NOTE — Progress Notes (Signed)
  Medical Nutrition Therapy:  Appt start time: 0920 end time:  1035.  Assessment:  Primary concerns today: Patient is here today due to uncontrolled type 2 diabetes.  She would like to lose weight.  Hx includes:  Type 2 Diabetes since 2007/2008, hyperlipidemia, HTN.  Her last HgbA1C was 7.5% 02/05/16 decreased from 11.1% 12/25/14.  She was drinking a lot of sweet tea at that time.  She then went on the [place] diet and lost 18 lbs, regaining 4-5.  The eating plan was not sustainable.  She lives with her husband and 54 year old daughter.  She shops and cooks but eats out most often.  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  Weight Hx: Lowest adult weight 122 lbs 55 yo Gained increased weight with last pregnancy and after an has been unable to lose lower than 178 lbs since. Highest weight 197 lbs  Preferred Learning Style:   No preference indicated   Learning Readiness:   Ready  Change in progress   MEDICATIONS: see list to include Invokana, Trulicity, glimepiride, metformin   DIETARY INTAKE:  Everyday foods include Eating out.  Avoided foods include Artificial sweeteners except stevia.  They cause headaches.  Had increased carbohydrate when she reduced to 20 grams per meal.  24-hr recall:  B ( AM): LS bacon, 100 calorie oatmeal with flax seeds Snk ( AM): walnuts  L ( PM): 1/2 grilled chicken pita with feta, oil and vinegar Snk ( PM):  D (8 PM): grilled tilapia and crab meat, baked sweet potato, clam chowder at [employer] ( PM): none Beverages: water, unsweetened tea, coffee with french vanilla creamer  Usual physical activity: 10 minutes on treadmill and 10 minutes on ecliptical daily.  Estimated energy needs: 1500 calories 170 g carbohydrates 112 g protein 42 g fat  Progress Towards Goal(s):  In progress.   Nutritional Diagnosis:  NB-1.1 Food and  nutrition-related knowledge deficit As related to balance of carbohydrate, protein, and fat.  As evidenced by diet hx.    Intervention:  Nutrition counseling and diabetes education initiated. Discussed Carb Counting by food group as method of portion control, reading food labels, and benefits of increased activity. Discussed being informed about foods eaten out, choose healthfully, eat slowly, stop when full.  Infuse water with fruit slices to add extra flavor. Try herbal teas over ice or decaf unsweetened tea. Aim for 2-3 Carb Choices per meal (30-45 grams) +/- 1 either way  Aim for 0-1 Carbs per snack if hungry  Include protein in moderation with your meals and snacks Consider reading food labels for Total Carbohydrate and Fat Grams of foods Consider  increasing your activity level by waling/elyptical for 25 minutes daily as tolerated.  Increase as able. Slowly. Try light weights/ core exercises. Consider checking BG at alternate times per day as directed by MD  Consider taking medication as directed by MD  Teaching Method Utilized:  Visual Auditory Hands on  Handouts given during visit include: Living Well with Diabetes Food Label handouts Meal Plan Card Label reading  Snack list  A1C sheet  Barriers to learning/adherence to lifestyle change: none  Demonstrated degree of understanding via:  Teach Back   Monitoring/Evaluation:  Dietary intake, exercise, label reading, and body weight prn.

## 2016-02-08 NOTE — Patient Instructions (Signed)
Infuse water with fruit slices to add extra flavor. Try herbal teas over ice or decaf unsweetened tea. Aim for 2-3 Carb Choices per meal (30-45 grams) +/- 1 either way  Aim for 0-1 Carbs per snack if hungry  Include protein in moderation with your meals and snacks Consider reading food labels for Total Carbohydrate and Fat Grams of foods Consider  increasing your activity level by waling/elyptical for 25 minutes daily as tolerated.  Increase as able. Slowly. Try light weights/ core exercises. Consider checking BG at alternate times per day as directed by MD  Consider taking medication as directed by MD

## 2016-02-10 ENCOUNTER — Other Ambulatory Visit: Payer: Self-pay | Admitting: Family Medicine

## 2016-02-10 DIAGNOSIS — N390 Urinary tract infection, site not specified: Secondary | ICD-10-CM

## 2016-02-10 DIAGNOSIS — IMO0002 Reserved for concepts with insufficient information to code with codable children: Secondary | ICD-10-CM

## 2016-02-10 DIAGNOSIS — E1165 Type 2 diabetes mellitus with hyperglycemia: Secondary | ICD-10-CM

## 2016-02-10 DIAGNOSIS — E1151 Type 2 diabetes mellitus with diabetic peripheral angiopathy without gangrene: Secondary | ICD-10-CM

## 2016-02-10 MED ORDER — CANAGLIFLOZIN 300 MG PO TABS: 300.0000 mg | ORAL_TABLET | Freq: Every day | ORAL | Status: DC

## 2016-02-10 MED ORDER — CIPROFLOXACIN HCL 250 MG PO TABS: 250.0000 mg | ORAL_TABLET | Freq: Two times a day (BID) | ORAL | Status: DC

## 2016-02-13 ENCOUNTER — Encounter: Payer: Self-pay | Admitting: Family Medicine

## 2016-02-15 ENCOUNTER — Telehealth: Payer: Self-pay | Admitting: Internal Medicine

## 2016-02-15 NOTE — Telephone Encounter (Signed)
Called pt and lvm advising her ok to the medication increases.

## 2016-02-15 NOTE — Telephone Encounter (Signed)
Please read message below and advise.  

## 2016-02-15 NOTE — Telephone Encounter (Signed)
OK 

## 2016-02-15 NOTE — Telephone Encounter (Signed)
Pt went to primary care and she increased her invokana to 100 mg as well as the trulicity that Dr. Cruzita Lederer just added for her  Pt letting Dr. Cruzita Lederer know and see if the PCP recommendations is what she agrees with.

## 2016-03-20 ENCOUNTER — Ambulatory Visit (INDEPENDENT_AMBULATORY_CARE_PROVIDER_SITE_OTHER): Payer: 59 | Admitting: Internal Medicine

## 2016-03-20 ENCOUNTER — Encounter: Payer: Self-pay | Admitting: Internal Medicine

## 2016-03-20 VITALS — BP 112/70 | HR 86 | Temp 97.6°F | Resp 12 | Wt 233.0 lb

## 2016-03-20 DIAGNOSIS — E1165 Type 2 diabetes mellitus with hyperglycemia: Secondary | ICD-10-CM

## 2016-03-20 DIAGNOSIS — IMO0002 Reserved for concepts with insufficient information to code with codable children: Secondary | ICD-10-CM | POA: Insufficient documentation

## 2016-03-20 DIAGNOSIS — E1151 Type 2 diabetes mellitus with diabetic peripheral angiopathy without gangrene: Secondary | ICD-10-CM | POA: Insufficient documentation

## 2016-03-20 MED ORDER — DULAGLUTIDE 1.5 MG/0.5ML ~~LOC~~ SOAJ: SUBCUTANEOUS | Status: DC

## 2016-03-20 NOTE — Patient Instructions (Addendum)
Please continue: - Metformin XR 500 mg in am and 1000 mg with dinner - Invokana 300 mg daily in am  Increase: - Trulicity to 1.5 mg weekly in am  Please use Amaryl before dinner only for large meals.  Please return in 1.5 months with your sugar log.

## 2016-03-20 NOTE — Progress Notes (Signed)
Patient ID: Angela Barr, female   DOB: [birth year], 55 y.o.   MRN: [id number]  HPI: Angela Barr is a 55 y.o.-year-old female, returning for f/u for DM2, dx in 2007, non-insulin-dependent, uncontrolled, without long term complications, but with hyperglycemia. Last visit 1.5 mo ago.  Last hemoglobin A1c was: Lab Results  Component Value Date   HGBA1C 7.5* 02/05/2016   HGBA1C 7.1 09/25/2015   HGBA1C 7.7 06/25/2015  09/25/2015: HbA1c 7.1%  Pt was on a regimen of: - Metformin XR [phone] mg. She had diarrhea with the regular metformin. - Amaryl 4 mg in pm - Invokana 100 mg daily - added 01/22/2015. + yeast inf.'s She had a UTI >> Cipro x 3 days, not recently. She tried Victoza.  She is now on: - Metformin XR 500 +1000 mg  - Amaryl 4 mg before dinner - Invokana 300 mg daily in am - Trulicity [id number] mg weekly.  Pt checks her sugars 1x a day and they started to improve, despite few higher sugars - per meter download: - am: 117-150 (150 if eats dinner late) >> 170-190 >> 121-130 >> 119-139 >> 117, 122 - 2h after b'fast: n/c >> 97 >> n/c >> 79, 142, 203 - before lunch: n/c >> 134 >> 110-128 >> 110s >> 110-118 >> 136 - 2h after lunch: n/c >> 110-120 >> n/c >> 163, 169 - before dinner: n/c >> 68x1, 117 >> 140-150 >> 120-128 >> 160 (after snacks) >> 105 - 2h after dinner: n/c >> 94-128 >> n/c >> 140s >> n/c - bedtime: n/c  - nighttime: n/c No lows. Lowest sugar was 120s >> 68 >> 108 >> 71 >> 108 >> 79x1; she has hypoglycemia awareness at 110.  Highest sugar was 280s >> 150 >> 203 >> 138 >> 164 >> 203 x1.  Glucometer: One Probation officer Flex   Pt's meals are: - Breakfast: bacon and sausage, scrambled eggs, wheat or rye toast - Lunch: salads, chicken, deli sandwich - Dinner: fish, chicken, eating out 5x/week - Snacks: butter cookies, chips Stopped drinking sweat tea! Eats sunflower seeds and nuts.  - no CKD, last BUN/creatinine:  Lab Results  Component Value Date   BUN 16  02/05/2016   CREATININE 0.66 02/05/2016   Component     Latest Ref Rng 02/05/2016  Microalb, Ur     0.0 - 1.9 mg/dL 0.9  Creatinine,U      125.8  MICROALB/CREAT RATIO     0.0 - 30.0 mg/g 0.7   - we stopped Lisinopril since she had a low BP and felt dizzy at points throughout the day. ACR's >> not high in last 5 years. 24 h urine for proteins >> normal. No h/o HTN.  - last set of lipids: Lab Results  Component Value Date   CHOL 185 02/05/2016   HDL 50.80 02/05/2016   LDLCALC 122* 02/05/2016   TRIG 60.0 02/05/2016   CHOLHDL 4 02/05/2016  0n Zocor. - last eye exam was in 12/2014. No DR.  - no numbness and tingling in her feet.  ROS: Constitutional: no weight loss, no fatigue,  no nocturia, + Excessive sweating Eyes: no blurry vision, no xerophthalmia ENT: no sore throat, no nodules palpated in throat, no dysphagia/odynophagia, no hoarseness Cardiovascular: no CP/SOB/palpitations/leg swelling Respiratory: no cough/SOB Gastrointestinal: no N/V/D/C Musculoskeletal: no muscle/joint aches Skin: No rash, no hair loss Neurological: no tremors/numbness/tingling/dizziness  I reviewed pt's medications, allergies, PMH, social hx, family hx, and changes were documented in the history of present illness. Otherwise, unchanged from  my initial visit note.  Past Medical History  Diagnosis Date  . Diabetes mellitus 2006  . Hypertension 2012  . Colon polyps 2008    [name]  . Hyperlipemia 2012  . Obesity    Past Surgical History  Procedure Laterality Date  . Vaginal hysterectomy  2006  . Breast biopsy  2003  . Tubal ligation  1990   History   Social History  . Marital Status: Married    Spouse Name: N/A  . Number of Children: 3   Occupational History  . RN    Social History Main Topics  . Smoking status: Never Smoker   . Smokeless tobacco: Never Used  . Alcohol Use: No  . Drug Use: No   Social History Narrative   3 caffeine drinks daily    Current  Outpatient Prescriptions on File Prior to Visit  Medication Sig Dispense Refill  . canagliflozin (INVOKANA) 300 MG TABS tablet Take 1 tablet (300 mg total) by mouth daily before breakfast. 30 tablet 5  . Dulaglutide (TRULICITY) A999333 0000000 SOPN Please inject 0.75 mg weekly 4 pen 1  . fluconazole (DIFLUCAN) 150 MG tablet 1 po qd x1, may repeat in 3 days prn 2 tablet 1  . glimepiride (AMARYL) 4 MG tablet Take 1 tablet (4 mg total) by mouth daily with supper. 90 tablet 1  . glucose blood (ONETOUCH VERIO) test strip Check your blood sugar twice daily 100 each 12  . hydrochlorothiazide (HYDRODIURIL) 25 MG tablet Take 1 tablet (25 mg total) by mouth daily. 1 po qd prn 30 tablet 11  . metFORMIN (GLUCOPHAGE-XR) 500 MG 24 hr tablet Take 500 mg in am and 1000 mg with dinner 120 tablet 5  . mupirocin cream (BACTROBAN) 2 % Apply 1 application topically 2 (two) times daily. 15 g 0   No current facility-administered medications on file prior to visit.   Allergies  Allergen Reactions  . Bactrim Nausea Only   Family History  Problem Relation Age of Onset  . Heart failure Father   . Hypertension Mother   . Hypertension Father   . Colon cancer Father 21  . Breast cancer Maternal Grandmother   . Diabetes Father   . Diabetes Paternal Grandmother    PE: BP 112/70 mmHg  Pulse 86  Temp(Src) 97.6 F (36.4 C) (Oral)  Resp 12  Wt 233 lb (105.688 kg)  SpO2 96% Body mass index is 38.77 kg/(m^2). Wt Readings from Last 3 Encounters:  03/20/16 233 lb (105.688 kg)  02/08/16 235 lb (106.595 kg)  02/05/16 238 lb (107.956 kg)   Constitutional: obese, in NAD Eyes: PERRLA, EOMI, no exophthalmos ENT: moist mucous membranes, no thyromegaly, no cervical lymphadenopathy Cardiovascular: RRR, No MRG Respiratory: CTA B Gastrointestinal: abdomen soft, NT, ND, BS+ Musculoskeletal: no deformities, strength intact in all 4 Skin: moist, warm, no rashes Neurological: no tremor with outstretched hands, DTR normal in  all 4  ASSESSMENT: 1. DM2, non-insulin-dependent, uncontrolled, without long term complications, but with hyperglycemia  PLAN:  1. Patient with long-standing, uncontrolled diabetes, on oral antidiabetic regimen, with worsening sugars after last visit (latest HbA1c was 7.5%, which was increased), now started to improve after she increased iNVOKANA to 300 mg daily. She had problems with yeast infections in the past, but has not noticed another one yet. She would like to also increase her Trulicity to target dose of 1.5 mg daily. I sent a new prescription for this today. She is also interested in reducing the amount of medicines  she is taking, therefore, will use Amaryl at night, only as needed for a larger dinner. She would like to see a chiropractor to help with her diabetes control, which is fine, and we may need to decrease the doses of her medicines if this is successful, but not before then. - I advised her to check more sugars when she comes back, including at bedtime >> Given new log sheets - at last visit, we discussed about the need to improve her diet >> placed referral to nutrition >> saw nutritionist since then - I recommended:  Patient Instructions  Please continue: - Metformin XR 500 mg in am and 1000 mg with dinner - Invokana 300 mg daily in am  Increase: - Trulicity to 1.5 mg weekly in am  Please use Amaryl before dinner only for large meals.  Please return in 1.5 months with your sugar log.   - continue checking sugars at different times of the day - check 1-2 times a day, rotating checks.  - advised for yearly eye exams >> she needs one - Return to clinic in  1.5  mo with sugar log - will check a new HbA1c 10

## 2016-05-15 ENCOUNTER — Ambulatory Visit (INDEPENDENT_AMBULATORY_CARE_PROVIDER_SITE_OTHER): Payer: 59 | Admitting: Internal Medicine

## 2016-05-15 ENCOUNTER — Encounter: Payer: Self-pay | Admitting: Internal Medicine

## 2016-05-15 VITALS — BP 130/80 | HR 111 | Ht 64.0 in | Wt 229.0 lb

## 2016-05-15 DIAGNOSIS — E1165 Type 2 diabetes mellitus with hyperglycemia: Secondary | ICD-10-CM

## 2016-05-15 LAB — POCT GLYCOSYLATED HEMOGLOBIN (HGB A1C): Hemoglobin A1C: 6.4

## 2016-05-15 NOTE — Addendum Note (Signed)
Addended by: Caprice Beaver T on: 05/15/2016 04:54 PM   Modules accepted: Orders

## 2016-05-15 NOTE — Progress Notes (Signed)
Patient ID: Angela Barr, female   DOB: 10/14/1961, 55 y.o.   MRN: [id number]  HPI: Angela Barr is a 55 y.o.-year-old female, returning for f/u for DM2, dx in 2007, non-insulin-dependent, uncontrolled, without long term complications, but with hyperglycemia. Last visit 2 mo ago.  Last hemoglobin A1c was: Lab Results  Component Value Date   HGBA1C 7.5* 02/05/2016   HGBA1C 7.1 09/25/2015   HGBA1C 7.7 06/25/2015  09/25/2015: HbA1c 7.1%  Pt was on a regimen of: - Metformin XR [phone] mg. She had diarrhea with the regular metformin. - Amaryl 4 mg in pm - Invokana 100 mg daily - added 01/22/2015. + yeast inf.'s She had a UTI >> Cipro x 3 days, not recently. She tried Victoza.  She is now on: - Metformin XR 500 +1000 mg  (- Amaryl 4 mg before dinner - occasionally) - Invokana 300 mg daily in am - Trulicity 1.5 mg weekly.  Pt checks her sugars 1x a day: - am: 117-150 (150 if eats dinner late) >> 170-190 >> 121-130 >> 119-139 >> 117, 122 >> 110-120 (130-140 in vacation) - 2h after b'fast: n/c >> 97 >> n/c >> 79, 142, 203 - before lunch: n/c >> 134 >> 110-128 >> 110s >> 110-118 >> 136 >> n/c - 2h after lunch: n/c >> 110-120 >> n/c >> 163, 169 >> 130s - before dinner: n/c >> 68x1, 117 >> 140-150 >> 120-128 >> 160 (after snacks) >> 105 >> n/c - 2h after dinner: n/c >> 94-128 >> n/c >> 140s >> n/c - bedtime: n/c  - nighttime: n/c No lows. Lowest sugar was 120s >> 68 >> 108 >> 71 >> 108 >> 79x1 >> 107; she has hypoglycemia awareness at 110.  Highest sugar was 280s >> 150 >> 203 >> 138 >> 164 >> 203 x1 >> 135-140  Glucometer: One Touch Verio Flex   Pt's meals are: - Breakfast: bacon and sausage, scrambled eggs, wheat or rye toast - Lunch: salads, chicken, deli sandwich - Dinner: fish, chicken, eating out 5x/week - Snacks: butter cookies, chips Stopped drinking sweat tea! Eats sunflower seeds and nuts.  - no CKD, last BUN/creatinine:  Lab Results  Component Value Date   BUN  16 02/05/2016   CREATININE 0.66 02/05/2016   Component     Latest Ref Rng 02/05/2016  Microalb, Ur     0.0 - 1.9 mg/dL 0.9  Creatinine,U      125.8  MICROALB/CREAT RATIO     0.0 - 30.0 mg/g 0.7   - we stopped Lisinopril since she had a low BP and felt dizzy at points throughout the day. ACR's >> not high in last 5 years. 24 h urine for proteins >> normal. No h/o HTN.  - last set of lipids: Lab Results  Component Value Date   CHOL 185 02/05/2016   HDL 50.80 02/05/2016   LDLCALC 122* 02/05/2016   TRIG 60.0 02/05/2016   CHOLHDL 4 02/05/2016  0n Zocor. - last eye exam was in 12/2014. No DR.  - no numbness and tingling in her feet.  ROS: Constitutional: + weight loss, no fatigue,  no nocturia, + Excessive sweating Eyes: no blurry vision, no xerophthalmia ENT: no sore throat, no nodules palpated in throat, no dysphagia/odynophagia, no hoarseness Cardiovascular: no CP/SOB/palpitations/leg swelling Respiratory: no cough/SOB Gastrointestinal: + N/no V/D/C Musculoskeletal: no muscle/joint aches Skin: No rash, no hair loss Neurological: no tremors/numbness/tingling/dizziness  I reviewed pt's medications, allergies, PMH, social hx, family hx, and changes were documented in the history of  present illness. Otherwise, unchanged from my initial visit note.  Past Medical History  Diagnosis Date  . Diabetes mellitus 2006  . Hypertension 2012  . Colon polyps 2008    [name]  . Hyperlipemia 2012  . Obesity    Past Surgical History  Procedure Laterality Date  . Vaginal hysterectomy  2006  . Breast biopsy  2003  . Tubal ligation  1990   History   Social History  . Marital Status: Married    Spouse Name: N/A  . Number of Children: 3   Occupational History  . RN    Social History Main Topics  . Smoking status: Never Smoker   . Smokeless tobacco: Never Used  . Alcohol Use: No  . Drug Use: No   Social History Narrative   3 caffeine drinks daily    Current  Outpatient Prescriptions on File Prior to Visit  Medication Sig Dispense Refill  . canagliflozin (INVOKANA) 300 MG TABS tablet Take 1 tablet (300 mg total) by mouth daily before breakfast. 30 tablet 5  . Dulaglutide (TRULICITY) 1.5 0000000 SOPN Please inject 1.5 mg once a week, under skin. 4 pen 2  . fluconazole (DIFLUCAN) 150 MG tablet 1 po qd x1, may repeat in 3 days prn 2 tablet 1  . glimepiride (AMARYL) 4 MG tablet Take 1 tablet (4 mg total) by mouth daily with supper. 90 tablet 1  . glucose blood (ONETOUCH VERIO) test strip Check your blood sugar twice daily 100 each 12  . hydrochlorothiazide (HYDRODIURIL) 25 MG tablet Take 1 tablet (25 mg total) by mouth daily. 1 po qd prn 30 tablet 11  . metFORMIN (GLUCOPHAGE-XR) 500 MG 24 hr tablet Take 500 mg in am and 1000 mg with dinner 120 tablet 5  . mupirocin cream (BACTROBAN) 2 % Apply 1 application topically 2 (two) times daily. 15 g 0   No current facility-administered medications on file prior to visit.   Allergies  Allergen Reactions  . Bactrim Nausea Only   Family History  Problem Relation Age of Onset  . Heart failure Father   . Hypertension Mother   . Hypertension Father   . Colon cancer Father 99  . Breast cancer Maternal Grandmother   . Diabetes Father   . Diabetes Paternal Grandmother    PE: BP 130/80 mmHg  Pulse 111  Ht 5\' 4"  (1.626 m)  Wt 229 lb (103.874 kg)  BMI 39.29 kg/m2  SpO2 95% Body mass index is 39.29 kg/(m^2). Wt Readings from Last 3 Encounters:  05/15/16 229 lb (103.874 kg)  03/20/16 233 lb (105.688 kg)  02/08/16 235 lb (106.595 kg)   Constitutional: obese, in NAD Eyes: PERRLA, EOMI, no exophthalmos ENT: moist mucous membranes, no thyromegaly, no cervical lymphadenopathy Cardiovascular: tachycardia, RR, No MRG Respiratory: CTA B Gastrointestinal: abdomen soft, NT, ND, BS+ Musculoskeletal: no deformities, strength intact in all 4 Skin: moist, warm, no rashes Neurological: no tremor with outstretched  hands, DTR normal in all 4  ASSESSMENT: 1. DM2, non-insulin-dependent, uncontrolled, without long term complications, but with hyperglycemia  PLAN:  1. Patient with long-standing, uncontrolled diabetes, on oral antidiabetic regimen, with improving sugars after adding Trulicity. She had problems with yeast infections from [place] in the past, but has not noticed another one yet.   - we increased Trulicity to target dose of 1.5 mg daily at last visit >> sugars great >> will not change regimen - she has some nausea in the first 2 nights after taking Trulicity (takes it in  am) >> advised for small meals those nights, but may need to go back to the lower Trulicity dose. - I recommended:  Patient Instructions  Please continue: - Metformin XR 500 mg in am and 1000 mg with dinner - Invokana 300 mg daily in am - Trulicity 1.5 mg weekly in am  Use Amaryl 4 mg before dinner only for large meals.  Please return in 3 months with your sugar log.    - continue checking sugars at different times of the day - check 1-2 times a day, rotating checks.  - advised for yearly eye exams >> she needs one - checked HbA1c today >> 6.4% (great!) - Return to clinic in 3 mo with sugar log   Philemon Kingdom, MD PhD [facility] Endocrinology

## 2016-05-15 NOTE — Patient Instructions (Signed)
Please continue: - Metformin XR 500 mg in am and 1000 mg with dinner - Invokana 300 mg daily in am - Trulicity 1.5 mg weekly in am  Use Amaryl 4 mg before dinner only for large meals.  Please return in 3 months with your sugar log.

## 2016-05-19 ENCOUNTER — Encounter: Payer: Self-pay | Admitting: Gastroenterology

## 2016-06-24 ENCOUNTER — Other Ambulatory Visit: Payer: Self-pay | Admitting: Internal Medicine

## 2016-07-04 ENCOUNTER — Encounter: Payer: Self-pay | Admitting: Medical

## 2016-07-04 ENCOUNTER — Ambulatory Visit (INDEPENDENT_AMBULATORY_CARE_PROVIDER_SITE_OTHER): Payer: 59 | Admitting: Medical

## 2016-07-04 VITALS — BP 126/85 | HR 94 | Temp 98.3°F | Ht 64.0 in | Wt 229.8 lb

## 2016-07-04 DIAGNOSIS — L853 Xerosis cutis: Secondary | ICD-10-CM | POA: Diagnosis not present

## 2016-07-04 DIAGNOSIS — L089 Local infection of the skin and subcutaneous tissue, unspecified: Secondary | ICD-10-CM | POA: Diagnosis not present

## 2016-07-04 DIAGNOSIS — L309 Dermatitis, unspecified: Secondary | ICD-10-CM

## 2016-07-04 MED ORDER — DOXYCYCLINE HYCLATE 100 MG PO TABS: 100.0000 mg | ORAL_TABLET | Freq: Two times a day (BID) | ORAL | 0 refills | Status: DC

## 2016-07-04 MED ORDER — AMMONIUM LACTATE 12 % EX LOTN: 1.0000 "application " | TOPICAL_LOTION | CUTANEOUS | 1 refills | Status: DC | PRN

## 2016-07-04 NOTE — Patient Instructions (Addendum)
For skin infection left nares will rx doxycycline antibiotic and continue mupirocin. If area persists then can refer to ENT.  For eczema history will refer to dermatologist. But also get opinion of dermatologist for potential dry skin on upper arms. Will rx lac-hydrin for the upper arms during the interim.  Follow up 10-14 days or as needed

## 2016-07-04 NOTE — Progress Notes (Signed)
Subjective:    Patient ID: Angela Barr, female    DOB: 1961-02-27, 55 y.o.   MRN: [id number]  HPI  Pt in with some left nostril soreness for 2-3 months. Pt has been rubbing some mupirocin on inside and outside of her left nostril. Her nostril stays tender. No fevers, no chills or sweats. She works long term. She saw Dr. Laury Axon in the past and got the mupirocin. But not working completlely. 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  Pt also has rash on her elbows.  Thick appearance to skin on elbows. Hyperpigmented.Some rash over knees.Reports scattered faint dry appearance with some flakiness to arms. Pt had these area for two weeks. Slight itch at most.  In past saw dermatologist about 1.5 years ago. Told maybe eczema.     Review of Systems  Constitutional: Negative for chills, fatigue and fever.  Respiratory: Negative for cough, chest tightness, shortness of breath and wheezing.   Cardiovascular: Negative for chest pain and palpitations.  Gastrointestinal: Negative for abdominal pain, blood in stool, diarrhea and nausea.  Skin: Negative for pallor and rash.       See hpi.  Hematological: Negative for adenopathy. Does not bruise/bleed easily.  Psychiatric/Behavioral: Negative for behavioral problems and confusion.    Past Medical History:  Diagnosis Date  . Colon polyps 2008   [place]--Dr Jethro Poling  . Diabetes mellitus 2006  . Hyperlipemia 2012  . Hypertension 2012  . Obesity      Social History   Social History  . Marital status: Married    Spouse name: N/A  . Number of children: 3  . Years of education: N/A   Occupational History  . RN    Social History Main Topics  . Smoking status: Never Smoker  . Smokeless tobacco: Never Used  . Alcohol use No  . Drug use: No  . Sexual activity: Not on file   Other Topics Concern  . Not on file   Social History Narrative   3 caffeine drinks daily     Past Surgical History:  Procedure Laterality Date  . BREAST BIOPSY   2003  . TUBAL LIGATION  1990  . VAGINAL HYSTERECTOMY  2006    Family History  Problem Relation Age of Onset  . Heart failure Father   . Hypertension Mother   . Hypertension Father   . Colon cancer Father 48  . Breast cancer Maternal Grandmother   . Diabetes Father   . Diabetes Paternal Grandmother     Allergies  Allergen Reactions  . Bactrim Nausea Only    Current Outpatient Prescriptions on File Prior to Visit  Medication Sig Dispense Refill  . canagliflozin (INVOKANA) 300 MG TABS tablet Take 1 tablet (300 mg total) by mouth daily before breakfast. 30 tablet 5  . fluconazole (DIFLUCAN) 150 MG tablet 1 po qd x1, may repeat in 3 days prn 2 tablet 1  . glimepiride (AMARYL) 4 MG tablet Take 1 tablet (4 mg total) by mouth daily with supper. 90 tablet 1  . glucose blood (ONETOUCH VERIO) test strip Check your blood sugar twice daily 100 each 12  . hydrochlorothiazide (HYDRODIURIL) 25 MG tablet Take 1 tablet (25 mg total) by mouth daily. 1 po qd prn 30 tablet 11  . metFORMIN (GLUCOPHAGE-XR) 500 MG 24 hr tablet Take 500 mg in am and 1000 mg with dinner 120 tablet 5  . mupirocin cream (BACTROBAN) 2 % Apply 1 application topically 2 (two) times daily. 15 g 0  .  TRULICITY 1.5 MG/0.5ML SOPN INJECT 1 SYRINGE UNDER THE SKIN ONCE A WEEK 2 mL 0   No current facility-administered medications on file prior to visit.     BP 126/85   Pulse 94   Temp 98.3 F (36.8 C) (Oral)   Ht 5\' 4"  (1.626 m)   Wt 229 lb 12.8 oz (104.2 kg)   SpO2 100%   BMI 39.45 kg/m       Objective:   Physical Exam  General  Mental Status - Alert. General Appearance - Well groomed. Not in acute distress.    HEENT Head- Normal.  Eye Sclera/Conjunctiva- Left- Normal. Right- Normal.   Neck Neck- Supple. No Masses.  Chest and Lung Exam Auscultation: Breath Sounds:-Clear even and unlabored.  Cardiovascular Auscultation:Rythm- Regular, rate and rhythm. Murmurs & Other Heart Sounds:Ausculatation of the  heart reveal- No Murmurs.  Lymphatic Head & Neck General Head & Neck Lymphatics: Bilateral: Description- No Localized lymphadenopathy.  Skin of nose- left nares area mild swollen faint tender. Inside nares mild swollen. No active dc.no redness  Skin- both elbows thick hyperpigmented feel to skin. Also arms slight scattered dry small flakes of skin.       Assessment & Plan:  For skin infection left nares will rx doxycycline antibiotic and continue mupirocin. If area persists then can refer to ENT.  For eczema history will refer to dermatologist. But also get opinion of dermatologist for potential dry skin on upper arms. Will rx lac-hydrin for the upper arms during the interim.  Follow up 10-14 days or as needed  Dafna Romo, Ramon Dredge, [employer]

## 2016-07-24 ENCOUNTER — Other Ambulatory Visit: Payer: Self-pay | Admitting: Internal Medicine

## 2016-07-24 ENCOUNTER — Telehealth: Payer: Self-pay | Admitting: Internal Medicine

## 2016-07-25 ENCOUNTER — Telehealth: Payer: Self-pay | Admitting: Internal Medicine

## 2016-07-25 MED ORDER — DULAGLUTIDE 1.5 MG/0.5ML ~~LOC~~ SOAJ: SUBCUTANEOUS | 2 refills | Status: DC

## 2016-07-25 NOTE — Telephone Encounter (Signed)
Pt needs refill on Trulicity called to [facility] please

## 2016-07-25 NOTE — Telephone Encounter (Signed)
Refill submitted. 

## 2016-07-28 ENCOUNTER — Other Ambulatory Visit: Payer: Self-pay

## 2016-07-28 MED ORDER — DULAGLUTIDE 1.5 MG/0.5ML ~~LOC~~ SOAJ: SUBCUTANEOUS | 2 refills | Status: DC

## 2016-08-04 ENCOUNTER — Other Ambulatory Visit: Payer: Self-pay | Admitting: Family Medicine

## 2016-08-04 DIAGNOSIS — E0822 Diabetes mellitus due to underlying condition with diabetic chronic kidney disease: Secondary | ICD-10-CM

## 2016-08-04 DIAGNOSIS — N182 Chronic kidney disease, stage 2 (mild): Principal | ICD-10-CM

## 2016-08-15 ENCOUNTER — Encounter: Payer: Self-pay | Admitting: Internal Medicine

## 2016-08-15 ENCOUNTER — Ambulatory Visit (INDEPENDENT_AMBULATORY_CARE_PROVIDER_SITE_OTHER): Payer: 59 | Admitting: Internal Medicine

## 2016-08-15 VITALS — BP 128/82 | HR 92 | Ht 65.5 in | Wt 225.0 lb

## 2016-08-15 DIAGNOSIS — E0822 Diabetes mellitus due to underlying condition with diabetic chronic kidney disease: Secondary | ICD-10-CM

## 2016-08-15 DIAGNOSIS — E1165 Type 2 diabetes mellitus with hyperglycemia: Secondary | ICD-10-CM | POA: Diagnosis not present

## 2016-08-15 DIAGNOSIS — N182 Chronic kidney disease, stage 2 (mild): Secondary | ICD-10-CM | POA: Diagnosis not present

## 2016-08-15 DIAGNOSIS — E1151 Type 2 diabetes mellitus with diabetic peripheral angiopathy without gangrene: Secondary | ICD-10-CM

## 2016-08-15 DIAGNOSIS — IMO0002 Reserved for concepts with insufficient information to code with codable children: Secondary | ICD-10-CM

## 2016-08-15 LAB — POCT GLYCOSYLATED HEMOGLOBIN (HGB A1C): HEMOGLOBIN A1C: 6.6

## 2016-08-15 MED ORDER — CANAGLIFLOZIN 300 MG PO TABS: 300.0000 mg | ORAL_TABLET | Freq: Every day | ORAL | 5 refills | Status: DC

## 2016-08-15 MED ORDER — METFORMIN HCL ER 500 MG PO TB24: ORAL_TABLET | ORAL | 3 refills | Status: DC

## 2016-08-15 MED ORDER — GLIMEPIRIDE 4 MG PO TABS: 4.0000 mg | ORAL_TABLET | Freq: Every day | ORAL | 3 refills | Status: DC

## 2016-08-15 NOTE — Addendum Note (Signed)
Addended by: Caprice Beaver T on: 08/15/2016 09:54 AM   Modules accepted: Orders

## 2016-08-15 NOTE — Patient Instructions (Addendum)
Patient Instructions  Please continue: - Metformin XR 500 mg in am and 1000 mg with dinner - Invokana 300 mg daily in am - Trulicity 1.5 mg weekly in am  Restart: - Amaryl 4 mg before dinner only for large meals.  Please return in 3 months with your sugar log.

## 2016-08-15 NOTE — Progress Notes (Signed)
Patient ID: Angela Barr, female   DOB: Nov 24, 1960, 55 y.o.   MRN: [id number]  HPI: Angela Barr is a 55 y.o.-year-old female, returning for f/u for DM2, dx in 2007, non-insulin-dependent, uncontrolled, without long term complications, but with hyperglycemia. Last visit 2 mo ago.  Last hemoglobin A1c was: Lab Results  Component Value Date   HGBA1C 6.4 05/15/2016   HGBA1C 7.5 (H) 02/05/2016   HGBA1C 7.1 09/25/2015  09/25/2015: HbA1c 7.1%  Pt was on a regimen of: - Metformin XR [phone] mg. She had diarrhea with the regular metformin. - Amaryl 4 mg in pm - Invokana 100 mg daily - added 01/22/2015. + yeast inf.'s She had a UTI >> Cipro x 3 days, not recently. She tried Victoza.  She is now on: - Metformin XR 500 +1000 mg  (- Amaryl 4 mg before dinner - occasionally) - Invokana 300 mg daily in am - Trulicity 1.5 mg weekly.  Pt checks her sugars 1x a day: - am: 170-190 >> 121-130 >> 119-139 >> 117, 122 >> 110-120 (130-140 in vacation) >> 108-146 - 2h after b'fast: n/c >> 97 >> n/c >> 79, 142, 203 >> 130, 151 - before lunch: n/c >> 134 >> 110-128 >> 110s >> 110-118 >> 136 >> n/c >> 115. 116 - 2h after lunch: n/c >> 110-120 >> n/c >> 163, 169 >> 130s >> 103, 181 - before dinner: n/c >> 68x1, 117 >> 140-150 >> 120-128 >> 160 (after snacks) >> 105 >> n/c >> 110 - 2h after dinner: n/c >> 94-128 >> n/c >> 140s >> n/c  - bedtime: n/c >> 200x1 - nighttime: n/c No lows. Lowest sugar was 107 >> 108; she has hypoglycemia awareness at 100.  Highest sugar was 135-140 >> 200 x1  Glucometer: One Touch Verio Flex   Pt's meals are: - Breakfast: bacon and sausage, scrambled eggs, wheat or rye toast - Lunch: salads, chicken, deli sandwich - Dinner: fish, chicken, eating out 5x/week - Snacks: butter cookies, chips Stopped drinking sweat tea! Eats sunflower seeds and nuts.  - no CKD, last BUN/creatinine:  Lab Results  Component Value Date   BUN 16 02/05/2016   CREATININE 0.66  02/05/2016   Component     Latest Ref Rng 02/05/2016  Microalb, Ur     0.0 - 1.9 mg/dL 0.9  Creatinine,U      125.8  MICROALB/CREAT RATIO     0.0 - 30.0 mg/g 0.7   - we stopped Lisinopril since she had a low BP and felt dizzy at points throughout the day. ACR's >> not high in last 5 years. 24 h urine for proteins >> normal. No h/o HTN.  - last set of lipids: Lab Results  Component Value Date   CHOL 185 02/05/2016   HDL 50.80 02/05/2016   LDLCALC 122 (H) 02/05/2016   TRIG 60.0 02/05/2016   CHOLHDL 4 02/05/2016  0n Zocor. - last eye exam was in 12/2014. No DR.  - no numbness and tingling in her feet.  ROS: Constitutional: + weight loss, no fatigue,  no nocturia Eyes: no blurry vision, no xerophthalmia ENT: no sore throat, no nodules palpated in throat, no dysphagia/odynophagia, no hoarseness Cardiovascular: no CP/SOB/palpitations/leg swelling Respiratory: no cough/SOB Gastrointestinal: no N/V/D/C Musculoskeletal: no muscle/joint aches Skin:+ rash - arms, legs >> saw dermatologist >> ? psoriasis, no hair loss Neurological: no tremors/numbness/tingling/dizziness  I reviewed pt's medications, allergies, PMH, social hx, family hx, and changes were documented in the history of present illness. Otherwise, unchanged from my  initial visit note.  Past Medical History:  Diagnosis Date  . Colon polyps 2008   [name]  . Diabetes mellitus 2006  . Hyperlipemia 2012  . Hypertension 2012  . Obesity    Past Surgical History:  Procedure Laterality Date  . BREAST BIOPSY  2003  . TUBAL LIGATION  1990  . VAGINAL HYSTERECTOMY  2006   History   Social History  . Marital Status: Married    Spouse Name: N/A  . Number of Children: 3   Occupational History  . RN    Social History Main Topics  . Smoking status: Never Smoker   . Smokeless tobacco: Never Used  . Alcohol Use: No  . Drug Use: No   Social History Narrative   3 caffeine drinks daily    Current  Outpatient Prescriptions on File Prior to Visit  Medication Sig Dispense Refill  . canagliflozin (INVOKANA) 300 MG TABS tablet Take 1 tablet (300 mg total) by mouth daily before breakfast. 30 tablet 5  . Dulaglutide (TRULICITY) 1.5 0000000 SOPN INJECT 1 SYRINGE UNDER THE SKIN ONCE A WEEK 2 mL 2  . fluconazole (DIFLUCAN) 150 MG tablet 1 po qd x1, may repeat in 3 days prn 2 tablet 1  . glucose blood (ONETOUCH VERIO) test strip Check your blood sugar twice daily 100 each 12  . metFORMIN (GLUCOPHAGE-XR) 500 MG 24 hr tablet TAKE 1 TABLET BY MOUTH IN THE MORNING AND 2 TABLETS WITH DINNER 120 tablet 0  . mupirocin cream (BACTROBAN) 2 % Apply 1 application topically 2 (two) times daily. 15 g 0  . ammonium lactate (LAC-HYDRIN) 12 % lotion Apply 1 application topically as needed for dry skin. (Patient not taking: Reported on 08/15/2016) 225 g 1  . doxycycline (VIBRA-TABS) 100 MG tablet Take 1 tablet (100 mg total) by mouth 2 (two) times daily. (Patient not taking: Reported on 08/15/2016) 20 tablet 0  . glimepiride (AMARYL) 4 MG tablet Take 1 tablet (4 mg total) by mouth daily with supper. (Patient not taking: Reported on 08/15/2016) 90 tablet 1  . hydrochlorothiazide (HYDRODIURIL) 25 MG tablet Take 1 tablet (25 mg total) by mouth daily. 1 po qd prn (Patient not taking: Reported on 08/15/2016) 30 tablet 11   No current facility-administered medications on file prior to visit.    Allergies  Allergen Reactions  . Bactrim Nausea Only   Family History  Problem Relation Age of Onset  . Heart failure Father   . Hypertension Mother   . Hypertension Father   . Colon cancer Father 48  . Breast cancer Maternal Grandmother   . Diabetes Father   . Diabetes Paternal Grandmother    PE: BP 128/82 (BP Location: Left Arm, Patient Position: Sitting)   Pulse 92   Ht 5' 5.5" (1.664 m)   Wt 225 lb (102.1 kg)   SpO2 96%   BMI 36.87 kg/m  Body mass index is 36.87 kg/m. Wt Readings from Last 3 Encounters:   08/15/16 225 lb (102.1 kg)  07/04/16 229 lb 12.8 oz (104.2 kg)  05/15/16 229 lb (103.9 kg)   Constitutional: obese, in NAD Eyes: PERRLA, EOMI, no exophthalmos ENT: moist mucous membranes, no thyromegaly, no cervical lymphadenopathy Cardiovascular: tachycardia, RR, No MRG Respiratory: CTA B Gastrointestinal: abdomen soft, NT, ND, BS+ Musculoskeletal: no deformities, strength intact in all 4 Skin: moist, warm, no rashes Neurological: no tremor with outstretched hands, DTR normal in all 4  ASSESSMENT: 1. DM2, non-insulin-dependent, uncontrolled, without long term complications, but with hyperglycemia  PLAN:  1. Patient with long-standing, uncontrolled diabetes, on oral antidiabetic regimen, with improving sugars after adding Trulicity. She had problems with yeast infections from [place] in the past, but has not noticed another one yet.  - She has overall good sugars with occas. Spikes  - had a 200 after dinner last night >> advised to restart using Amaryl prn. - I recommended:  Patient Instructions  Please continue: - Metformin XR 500 mg in am and 1000 mg with dinner - Invokana 300 mg daily in am - Trulicity 1.5 mg weekly in am  Restart: - Amaryl 4 mg before dinner only for large meals.  Please return in 3 months with your sugar log.    - continue checking sugars at different times of the day - check 1-2 times a day, rotating checks.  - advised for yearly eye exams >> she needs one >> will schedule - will have flu shot at work in 08/2016 - checked HbA1c today >> 6.6% (great!) - Return to clinic in 3 mo with sugar log   Philemon Kingdom, MD PhD [facility] Endocrinology

## 2016-11-14 ENCOUNTER — Ambulatory Visit (INDEPENDENT_AMBULATORY_CARE_PROVIDER_SITE_OTHER): Payer: 59 | Admitting: Internal Medicine

## 2016-11-14 ENCOUNTER — Encounter: Payer: Self-pay | Admitting: Internal Medicine

## 2016-11-14 VITALS — BP 138/92 | HR 99 | Ht 64.5 in | Wt 227.0 lb

## 2016-11-14 DIAGNOSIS — E1165 Type 2 diabetes mellitus with hyperglycemia: Secondary | ICD-10-CM | POA: Diagnosis not present

## 2016-11-14 DIAGNOSIS — R8290 Unspecified abnormal findings in urine: Secondary | ICD-10-CM | POA: Diagnosis not present

## 2016-11-14 LAB — POCT GLYCOSYLATED HEMOGLOBIN (HGB A1C): Hemoglobin A1C: 6.3

## 2016-11-14 MED ORDER — DULAGLUTIDE 1.5 MG/0.5ML ~~LOC~~ SOAJ: SUBCUTANEOUS | 5 refills | Status: DC

## 2016-11-14 NOTE — Patient Instructions (Signed)
Please continue: - Metformin XR 500 mg in am and 1000 mg with dinner - Invokana 300 mg daily in am - Trulicity 1.5 mg weekly in am - Amaryl 4 mg before dinner only for large meals.  Please return in 3 months with your sugar log.

## 2016-11-14 NOTE — Addendum Note (Signed)
Addended by: Caprice Beaver T on: 11/14/2016 04:39 PM   Modules accepted: Orders

## 2016-11-14 NOTE — Progress Notes (Addendum)
Patient ID: Angela Barr, female   DOB: [birth year], 56 y.o.   MRN: [id number]  HPI: Angela Barr is a 56 y.o.-year-old female, returning for f/u for DM2, dx in 2007, non-insulin-dependent, uncontrolled, without long term complications, but with hyperglycemia. Last visit 3 mo ago.  She was traveling to [place] over [place].   Last hemoglobin A1c was: Lab Results  Component Value Date   HGBA1C 6.6 08/15/2016   HGBA1C 6.4 05/15/2016   HGBA1C 7.5 (H) 02/05/2016  09/25/2015: HbA1c 7.1%  Pt was on a regimen of: - Metformin XR [phone] mg. She had diarrhea with the regular metformin. - Amaryl 4 mg in pm - Invokana 100 mg daily - added 01/22/2015. + yeast inf.'s She had a UTI >> Cipro x 3 days, not recently. She tried Victoza.  She is now on: - Metformin XR 500 +1000 mg  - Amaryl 4 mg before dinner - occasionally - Invokana 300 mg daily in am - Trulicity 1.5 mg weekly - skipped 2 doses  Pt was checking her sugars 1x a day - stopped 1 mo ago. From last visit: - am: 119-139 >> 117, 122 >> 110-120 (130-140 in vacation) >> 108-146 - 2h after b'fast: n/c >> 97 >> n/c >> 79, 142, 203 >> 130, 151 - before lunch: n/c >> 134 >> 110-128 >> 110s >> 110-118 >> 136 >> n/c >> 115, 116 - 2h after lunch: n/c >> 110-120 >> n/c >> 163, 169 >> 130s >> 103, 181 - before dinner: 140-150 >> 120-128 >> 160 (after snacks) >> 105 >> n/c >> 110 - 2h after dinner: n/c >> 94-128 >> n/c >> 140s >> n/c  - bedtime: n/c >> 200x1 - nighttime: n/c No lows. Lowest sugar was 107 >> 108 >> ?; she has hypoglycemia awareness at 100.  Highest sugar was 135-140 >> 200 x1 >> ?  Glucometer: One Prob[employer] Flex   Pt's meals are: - Breakfast: bacon and sausage, scrambled eggs, wheat or rye toast - Lunch: salads, chicken, deli sandwich - Dinner: fish, chicken, eating out 5x/week - Snacks: butter cookies, chips Stopped drinking sweat tea and sodas. Eats sunflower seeds and nuts.  - no CKD, last BUN/creatinine:   Lab Results  Component Value Date   BUN 16 02/05/2016   CREATININE 0.66 02/05/2016   Component     Latest Ref Rng 02/05/2016  Microalb, Ur     0.0 - 1.9 mg/dL 0.9  Creatinine,U      125.8  MICROALB/CREAT RATIO     0.0 - 30.0 mg/g 0.7   - we stopped Lisinopril since she had a low BP and felt dizzy at points throughout the day. ACR's >> not high in last 5 years. 24 h urine for proteins >> normal. No h/o HTN.  - last set of lipids: Lab Results  Component Value Date   CHOL 185 02/05/2016   HDL 50.80 02/05/2016   LDLCALC 122 (H) 02/05/2016   TRIG 60.0 02/05/2016   CHOLHDL 4 02/05/2016  0n Zocor. - last eye exam was in 12/2015. No DR.  - no numbness and tingling in her feet.  ROS: Constitutional: no weight loss or loss, no fatigue,  no nocturia Eyes: no blurry vision, no xerophthalmia ENT: no sore throat, no nodules palpated in throat, no dysphagia/odynophagia, no hoarseness Cardiovascular: no CP/SOB/palpitations/leg swelling Respiratory: no cough/SOB Gastrointestinal: no N/V/D/C Musculoskeletal: no muscle/joint aches Skin: no rash, no hair loss Neurological: no tremors/numbness/tingling/dizziness  I reviewed pt's medications, allergies, PMH, social hx, family hx,  and changes were documented in the history of present illness. Otherwise, unchanged from my initial visit note.  Past Medical History:  Diagnosis Date  . Colon polyps 2008   [name]  . Diabetes mellitus 2006  . Hyperlipemia 2012  . Hypertension 2012  . Obesity    Past Surgical History:  Procedure Laterality Date  . BREAST BIOPSY  2003  . TUBAL LIGATION  1990  . VAGINAL HYSTERECTOMY  2006   History   Social History  . Marital Status: Married    Spouse Name: N/A  . Number of Children: 3   Occupational History  . RN    Social History Main Topics  . Smoking status: Never Smoker   . Smokeless tobacco: Never Used  . Alcohol Use: No  . Drug Use: No   Social History Narrative   3  caffeine drinks daily    Current Outpatient Prescriptions on File Prior to Visit  Medication Sig Dispense Refill  . canagliflozin (INVOKANA) 300 MG TABS tablet Take 1 tablet (300 mg total) by mouth daily before breakfast. 30 tablet 5  . Dulaglutide (TRULICITY) 1.5 0000000 SOPN INJECT 1 SYRINGE UNDER THE SKIN ONCE A WEEK 2 mL 2  . fluconazole (DIFLUCAN) 150 MG tablet 1 po qd x1, may repeat in 3 days prn 2 tablet 1  . glimepiride (AMARYL) 4 MG tablet Take 1 tablet (4 mg total) by mouth daily with supper. 30 tablet 3  . glucose blood (ONETOUCH VERIO) test strip Check your blood sugar twice daily 100 each 12  . metFORMIN (GLUCOPHAGE-XR) 500 MG 24 hr tablet TAKE 1 TABLET BY MOUTH IN THE MORNING AND 2 TABLETS WITH DINNER 270 tablet 3  . ammonium lactate (LAC-HYDRIN) 12 % lotion Apply 1 application topically as needed for dry skin. (Patient not taking: Reported on 08/15/2016) 225 g 1  . Clobetasol Prop Emollient Base (CLOBETASOL PROPIONATE E) 0.05 % emollient cream Apply topically 2 (two) times daily.    . mupirocin cream (BACTROBAN) 2 % Apply 1 application topically 2 (two) times daily. (Patient not taking: Reported on 11/14/2016) 15 g 0   No current facility-administered medications on file prior to visit.    Allergies  Allergen Reactions  . Bactrim Nausea Only   Family History  Problem Relation Age of Onset  . Heart failure Father   . Hypertension Mother   . Hypertension Father   . Colon cancer Father 19  . Breast cancer Maternal Grandmother   . Diabetes Father   . Diabetes Paternal Grandmother    PE: BP (!) 138/92 (BP Location: Right Arm, Patient Position: Sitting)   Pulse 99   Ht 5' 4.5" (1.638 m)   Wt 227 lb (103 kg)   SpO2 98%   BMI 38.36 kg/m  Body mass index is 38.36 kg/m. Wt Readings from Last 3 Encounters:  11/14/16 227 lb (103 kg)  08/15/16 225 lb (102.1 kg)  07/04/16 229 lb 12.8 oz (104.2 kg)   Constitutional: obese, in NAD Eyes: PERRLA, EOMI, no exophthalmos ENT:  moist mucous membranes, no thyromegaly, no cervical lymphadenopathy Cardiovascular: tachycardia, RR, No MRG Respiratory: CTA B Gastrointestinal: abdomen soft, NT, ND, BS+ Musculoskeletal: no deformities, strength intact in all 4 Skin: moist, warm, no rashes Neurological: no tremor with outstretched hands, DTR normal in all 4 Diabetic Foot Exam - Simple   Simple Foot Form Diabetic Foot exam was performed with the following findings:  Yes 11/14/2016  2:19 PM  Visual Inspection No deformities, no ulcerations, no other  skin breakdown bilaterally:  Yes Sensation Testing Intact to touch and monofilament testing bilaterally:  Yes Pulse Check Posterior Tibialis and Dorsalis pulse intact bilaterally:  Yes Comments    ASSESSMENT: 1. DM2, non-insulin-dependent, uncontrolled, without long term complications, but with hyperglycemia  2. Strong odor of urine  PLAN:  1. Patient with long-standing, uncontrolled diabetes, on oral antidiabetic regimen and Trulicity. She had problems with yeast infections from [place] in the past, but not recently.  - At last visit, she had overall good sugars with occas. spikes  >> I advised to restart using Amaryl prn. For larger meal. She is doing this. - no sugars checked in last month but checked HbA1c today >> 6.3% (great!). - I recommended:  Patient Instructions  Please continue: - Metformin XR 500 mg in am and 1000 mg with dinner - Invokana 300 mg daily in am - Trulicity 1.5 mg weekly in am - Amaryl 4 mg before dinner only for large meals.  Please return in 3 months with your sugar log.    - continue checking sugars at different times of the day - check 1-2 times a day, rotating checks.  - advised for yearly eye exams >> she needs one >> pending for March - had flu shot at work in 08/2016 - Return to clinic in 3 mo with sugar log   2. Strong odor of urine - will check a U/A  Office Visit on 11/14/2016  Component Date Value Ref Range Status  .  Color, Urine 11/14/2016 YELLOW  YELLOW Final  . APPearance 11/14/2016 CLEAR  CLEAR Final  . Specific Gravity, Urine 11/14/2016 1.028  1.001 - 1.035 Final  . pH 11/14/2016 5.5  5.0 - 8.0 Final  . Glucose, UA 11/14/2016 3+* NEGATIVE Final  . Bilirubin Urine 11/14/2016 NEGATIVE  NEGATIVE Final  . Ketones, ur 11/14/2016 NEGATIVE  NEGATIVE Final  . Hgb urine dipstick 11/14/2016 NEGATIVE  NEGATIVE Final  . Protein, ur 11/14/2016 NEGATIVE  NEGATIVE Final  . Nitrite 11/14/2016 NEGATIVE  NEGATIVE Final  . Leukocytes, UA 11/14/2016 NEGATIVE  NEGATIVE Final  . WBC, UA 11/14/2016 0-5  <=5 WBC/HPF Final  . RBC / HPF 11/14/2016 0-2  <=2 RBC/HPF Final  . Squamous Epithelial / LPF 11/14/2016 0-5  <=5 HPF Final  . Bacteria, UA 11/14/2016 FEW* NONE SEEN HPF Final  . Crystals 11/14/2016 NONE SEEN  NONE SEEN HPF Final  . Casts 11/14/2016 NONE SEEN  NONE SEEN LPF Final  . Yeast 11/14/2016 NONE SEEN  NONE SEEN HPF Final  . Hemoglobin A1C 11/14/2016 6.3   Final   No pbs with the U/A. Glu is expected (Invokana).  Philemon Kingdom, MD PhD [facility] Endocrinology

## 2016-11-15 LAB — URINALYSIS W MICROSCOPIC + REFLEX CULTURE
BILIRUBIN URINE: NEGATIVE
CRYSTALS: NONE SEEN [HPF]
Casts: NONE SEEN [LPF]
Hgb urine dipstick: NEGATIVE
Ketones, ur: NEGATIVE
LEUKOCYTES UA: NEGATIVE
Nitrite: NEGATIVE
Protein, ur: NEGATIVE
SPECIFIC GRAVITY, URINE: 1.028 (ref 1.001–1.035)
Yeast: NONE SEEN [HPF]
pH: 5.5 (ref 5.0–8.0)

## 2017-02-11 ENCOUNTER — Other Ambulatory Visit: Payer: Self-pay | Admitting: Internal Medicine

## 2017-02-11 DIAGNOSIS — E1151 Type 2 diabetes mellitus with diabetic peripheral angiopathy without gangrene: Secondary | ICD-10-CM

## 2017-02-11 DIAGNOSIS — IMO0002 Reserved for concepts with insufficient information to code with codable children: Secondary | ICD-10-CM

## 2017-02-11 DIAGNOSIS — E1165 Type 2 diabetes mellitus with hyperglycemia: Principal | ICD-10-CM

## 2017-02-11 NOTE — Telephone Encounter (Signed)
Refill canagliflozin (INVOKANA) 300 MG TABS tablet  [facility] [address], [address] [phone] (Phone) [phone] (Fax)

## 2017-02-12 ENCOUNTER — Ambulatory Visit: Payer: 59 | Admitting: Internal Medicine

## 2017-02-12 ENCOUNTER — Telehealth: Payer: Self-pay | Admitting: Internal Medicine

## 2017-02-12 NOTE — Telephone Encounter (Signed)
Patient no showed today's appt. Please advise on how to follow up. °A. No follow up necessary. °B. Follow up urgent. Contact patient immediately. °C. Follow up necessary. Contact patient and schedule visit in ___ days. °D. Follow up advised. Contact patient and schedule visit in ____weeks. ° °

## 2017-02-12 NOTE — Telephone Encounter (Signed)
3mo

## 2017-02-17 ENCOUNTER — Ambulatory Visit: Payer: 59 | Admitting: Internal Medicine

## 2017-02-24 ENCOUNTER — Ambulatory Visit (INDEPENDENT_AMBULATORY_CARE_PROVIDER_SITE_OTHER): Payer: 59 | Admitting: Family Medicine

## 2017-02-24 ENCOUNTER — Encounter: Payer: Self-pay | Admitting: Family Medicine

## 2017-02-24 VITALS — BP 132/74 | HR 97 | Temp 98.2°F | Resp 16 | Ht 64.5 in | Wt 231.4 lb

## 2017-02-24 DIAGNOSIS — R8299 Other abnormal findings in urine: Secondary | ICD-10-CM | POA: Diagnosis not present

## 2017-02-24 DIAGNOSIS — I10 Essential (primary) hypertension: Secondary | ICD-10-CM

## 2017-02-24 DIAGNOSIS — J3489 Other specified disorders of nose and nasal sinuses: Secondary | ICD-10-CM | POA: Insufficient documentation

## 2017-02-24 DIAGNOSIS — R82998 Other abnormal findings in urine: Secondary | ICD-10-CM

## 2017-02-24 LAB — POC URINALSYSI DIPSTICK (AUTOMATED)
Bilirubin, UA: NEGATIVE
Blood, UA: NEGATIVE
KETONES UA: NEGATIVE
Leukocytes, UA: NEGATIVE
Nitrite, UA: NEGATIVE
Protein, UA: NEGATIVE
SPEC GRAV UA: 1.025 (ref 1.010–1.025)
Urobilinogen, UA: 0.2 E.U./dL
pH, UA: 6 (ref 5.0–8.0)

## 2017-02-24 LAB — BASIC METABOLIC PANEL
BUN: 14 mg/dL (ref 6–23)
CALCIUM: 9.5 mg/dL (ref 8.4–10.5)
CO2: 26 meq/L (ref 19–32)
Chloride: 105 mEq/L (ref 96–112)
Creatinine, Ser: 0.76 mg/dL (ref 0.40–1.20)
GFR: 101.83 mL/min (ref >60.00)
Glucose, Bld: 119 mg/dL — ABNORMAL HIGH (ref 70–99)
Potassium: 3.8 mEq/L (ref 3.5–5.1)
Sodium: 139 mEq/L (ref 135–145)

## 2017-02-24 NOTE — Progress Notes (Signed)
Patient ID: Angela Barr, female   DOB: 22-Mar-1961, 56 y.o.   MRN: [id number]     Subjective:    Patient ID: Angela Barr, female    DOB: [birth year], 56 y.o.   MRN: [id number]  Chief Complaint  Patient presents with  . foamy urine    has history of protein in urine.  sugars have been running high, not drinking much water    HPI Patient is in today for foamy urine since Saturday.  She has a history of having protein in her urine.  Also her sugars have been running abnormal and she has not been drinking enough water.   Past Medical History:  Diagnosis Date  . Colon polyps 2008   [name]  . Diabetes mellitus 2006  . Hyperlipemia 2012  . Hypertension 2012  . Obesity     Past Surgical History:  Procedure Laterality Date  . BREAST BIOPSY  2003  . TUBAL LIGATION  1990  . VAGINAL HYSTERECTOMY  2006    Family History  Problem Relation Age of Onset  . Heart failure Father   . Hypertension Mother   . Hypertension Father   . Colon cancer Father 78  . Breast cancer Maternal Grandmother   . Diabetes Father   . Diabetes Paternal Grandmother     Social History   Social History  . Marital status: Married    Spouse name: N/A  . Number of children: 3  . Years of education: N/A   Occupational History  . RN    Social History Main Topics  . Smoking status: Never Smoker  . Smokeless tobacco: Never Used  . Alcohol use No  . Drug use: No  . Sexual activity: Not on file   Other Topics Concern  . Not on file   Social History Narrative   3 caffeine drinks daily     Outpatient Medications Prior to Visit  Medication Sig Dispense Refill  . Dulaglutide (TRULICITY) 1.5 DT/2.6ZT SOPN INJECT 1 SYRINGE UNDER THE SKIN ONCE A WEEK 4 pen 5  . fluconazole (DIFLUCAN) 150 MG tablet 1 po qd x1, may repeat in 3 days prn 2 tablet 1  . glimepiride (AMARYL) 4 MG tablet Take 1 tablet (4 mg total) by mouth daily with supper. 30 tablet 3  . glucose blood (ONETOUCH VERIO)  test strip Check your blood sugar twice daily 100 each 12  . INVOKANA 300 MG TABS tablet TAKE 1 TABLET(300 MG) BY MOUTH DAILY BEFORE BREAKFAST 30 tablet 0  . metFORMIN (GLUCOPHAGE-XR) 500 MG 24 hr tablet TAKE 1 TABLET BY MOUTH IN THE MORNING AND 2 TABLETS WITH DINNER 270 tablet 3  . mupirocin cream (BACTROBAN) 2 % Apply 1 application topically 2 (two) times daily. 15 g 0  . ammonium lactate (LAC-HYDRIN) 12 % lotion Apply 1 application topically as needed for dry skin. (Patient not taking: Reported on 08/15/2016) 225 g 1  . Clobetasol Prop Emollient Base (CLOBETASOL PROPIONATE E) 0.05 % emollient cream Apply topically 2 (two) times daily.     No facility-administered medications prior to visit.     Allergies  Allergen Reactions  . Bactrim Nausea Only    Review of Systems  Constitutional: Negative for fever and malaise/fatigue.  HENT: Negative for congestion.   Eyes: Negative for blurred vision.  Respiratory: Negative for cough and shortness of breath.   Cardiovascular: Negative for chest pain, palpitations and leg swelling.  Gastrointestinal: Negative for vomiting.  Genitourinary: Negative for dysuria, flank pain, frequency, hematuria and  urgency.  Musculoskeletal: Negative for back pain.  Skin: Negative for rash.  Neurological: Negative for loss of consciousness and headaches.       Objective:    Physical Exam  Constitutional: She is oriented to person, place, and time. She appears well-developed and well-nourished. No distress.  HENT:  Head: Normocephalic and atraumatic.  Nose:    Eyes: Conjunctivae are normal.  Neck: Normal range of motion. No thyromegaly present.  Cardiovascular: Normal rate and regular rhythm.   Pulmonary/Chest: Effort normal and breath sounds normal. She has no wheezes.  Abdominal: Soft. Bowel sounds are normal. There is no tenderness.  Musculoskeletal: Normal range of motion. She exhibits no edema or deformity.  Neurological: She is alert and  oriented to person, place, and time.  Skin: Skin is warm and dry. She is not diaphoretic.  Psychiatric: She has a normal mood and affect. Her behavior is normal. Judgment and thought content normal.  Nursing note and vitals reviewed.   BP 132/74 (BP Location: Left Arm, Cuff Size: Large)   Pulse 97   Temp 98.2 F (36.8 C) (Oral)   Resp 16   Ht 5' 4.5" (1.638 m)   Wt 231 lb 6.4 oz (105 kg)   SpO2 100%   BMI 39.11 kg/m  Wt Readings from Last 3 Encounters:  02/24/17 231 lb 6.4 oz (105 kg)  11/14/16 227 lb (103 kg)  08/15/16 225 lb (102.1 kg)     Lab Results  Component Value Date   WBC 4.2 02/05/2016   HGB 13.7 02/05/2016   HCT 43.1 02/05/2016   PLT 245.0 02/05/2016   GLUCOSE 119 (H) 02/05/2016   CHOL 185 02/05/2016   TRIG 60.0 02/05/2016   HDL 50.80 02/05/2016   LDLCALC 122 (H) 02/05/2016   ALT 43 (H) 02/05/2016   AST 24 02/05/2016   NA 139 02/05/2016   K 4.0 02/05/2016   CL 104 02/05/2016   CREATININE 0.66 02/05/2016   BUN 16 02/05/2016   CO2 28 02/05/2016   TSH 0.66 02/05/2016   HGBA1C 6.3 11/14/2016   MICROALBUR 0.9 02/05/2016    Lab Results  Component Value Date   TSH 0.66 02/05/2016   Lab Results  Component Value Date   WBC 4.2 02/05/2016   HGB 13.7 02/05/2016   HCT 43.1 02/05/2016   MCV 79.3 02/05/2016   PLT 245.0 02/05/2016   Lab Results  Component Value Date   NA 139 02/05/2016   K 4.0 02/05/2016   CO2 28 02/05/2016   GLUCOSE 119 (H) 02/05/2016   BUN 16 02/05/2016   CREATININE 0.66 02/05/2016   BILITOT 0.4 02/05/2016   ALKPHOS 58 02/05/2016   AST 24 02/05/2016   ALT 43 (H) 02/05/2016   PROT 7.4 02/05/2016   ALBUMIN 4.3 02/05/2016   CALCIUM 9.9 02/05/2016   ANIONGAP 12 12/26/2015   GFR 120.31 02/05/2016   Lab Results  Component Value Date   CHOL 185 02/05/2016   Lab Results  Component Value Date   HDL 50.80 02/05/2016   Lab Results  Component Value Date   LDLCALC 122 (H) 02/05/2016   Lab Results  Component Value Date   TRIG  60.0 02/05/2016   Lab Results  Component Value Date   CHOLHDL 4 02/05/2016   Lab Results  Component Value Date   HGBA1C 6.3 11/14/2016       Assessment & Plan:   Problem List Items Addressed This Visit      Unprioritized   HTN (hypertension)   Relevant  Orders   Basic metabolic panel   Nasal sore    Doxycycline 100 mg bid x 7 days  bactroban        Other Visit Diagnoses    Foamy urine    -  Primary   Relevant Orders   POCT Urinalysis Dipstick (Automated) (Completed)      I have discontinued Ms. Elbaum's ammonium lactate and Clobetasol Prop Emollient Base. I am also having her maintain her glucose blood, fluconazole, mupirocin cream, glimepiride, metFORMIN, Dulaglutide, and INVOKANA.  No orders of the defined types were placed in this encounter.   [name], DO

## 2017-02-24 NOTE — Assessment & Plan Note (Signed)
Doxycycline 100 mg bid x 7 days  bactroban

## 2017-02-24 NOTE — Progress Notes (Signed)
Pre visit review using our clinic review tool, if applicable. No additional management support is needed unless otherwise documented below in the visit note. 

## 2017-02-24 NOTE — Patient Instructions (Signed)
MRSA Infection, Adult MRSA stands for methicillin-resistant Staphylococcus aureus. This type of infection is caused by Staphylococcus aureus bacteria that are no longer affected by the medicines used to kill them (drug resistant). Staphylococcus (staph) bacteria are normally found on the skin or in the nose of healthy people. In most cases, these bacteria do not cause infection. But if these resistant bacteria enter your body through a cut or sore, they can cause a serious infection on your skin or in other parts of your body. There is a slight chance that the staph on your skin or in your nose is MRSA. There are two types of MRSA infections:  Hospital-acquired MRSA is bacteria that you get in the hospital.  Community-acquired MRSA is bacteria that you get somewhere other than in a hospital.  What increases the risk? Hospital-acquired MRSA is more common. You could be at risk for this infection if you are in the hospital and you:  Have surgery or a procedure.  Have an IV access or a catheter tube placed in your body.  Have weak resistance to germs (weakened immune system).  Are elderly.  Are on kidney dialysis.  You could be at risk for community-acquired MRSA if you have a break in your skin and come into contact with MRSA. This may happen if you:  Play sports where there is skin-to-skin contact.  Live in a crowded setting, like a dormitory or a military barracks.  Share towels, razors, or sports equipment with other people.  What are the signs or symptoms? Symptoms of hospital-acquired MRSA depend on where MRSA has spread. Symptoms may include:  Wound infection.  Skin infection.  Rash.  Pneumonia.  Fever and chills.  Difficulty breathing.  Chest pain.  Community-acquired MRSA is most likely to start as a scratch or cut that becomes infected. Symptoms may include:  A pus-filled pimple.  A boil on your skin.  Pus draining from your skin.  A sore (abscess) under  your skin or somewhere in your body.  Fever with or without chills.  How is this diagnosed? The diagnosis of MRSA is made by taking a sample from an infected area and sending it to a lab for testing. A lab technician can grow (culture) MRSA and check it under a microscope. The cultured MRSA can be tested to see which type of antibiotic medicine will work to treat it. Newer tests can identify MRSA more quickly by testing bacteria samples for MRSA genes. Your health care provider can diagnose MRSA using samples from:  Cuts or wounds in infected areas.  Nasal swabs.  Saliva or cough specimens from deep in the lungs (sputum).  Urine.  Blood.  You may also have:  Imaging studies (such as X-ray or MRI) to check if the infection has spread to the lungs, bones, or joints.  A culture and sensitivity test of blood or fluids from inside the joints.  How is this treated? Treatment depends on how severe, deep, or extensive the infection is. Very bad infections may require a hospital stay.  Some skin infections, such as a small boil or sore (abscess), may be treated by draining pus from the site of the infection.  More extensive surgery to drain pus may be necessary for deeper or more widespread soft tissue infections.  You may then have to take antibiotic medicine given by mouth or through a vein. You may start antibiotic treatment right away or after testing can be done to see what antibiotic medicine should be   used.  Follow these instructions at home:  Take your antibiotics as directed by your health care provider. Take the medicine as prescribed until it is finished.  Avoid close contact with those around you as much as possible. Do not use towels, razors, toothbrushes, bedding, or other items that will be used by others.  Wash your hands frequently for 15 seconds with soap and water. Dry your hands with a clean or disposable towel.  When you are not able to wash your hands, use hand  sanitizer that is more than 60 percent alcohol.  Wash towels, sheets, or clothes in the washing machine with detergent and hot water. Dry them in a hot dryer.  Follow your health care provider's instructions for wound care. Wash your hands before and after changing your bandages.  Always shower after exercising.  Keep all cuts and scrapes clean and covered with a bandage.  Be sure to tell all your health care providers that you have MRSA so they are aware of your infection. Contact a health care provider if:  You have a cut, scrape, pimple, or boil that becomes red, swollen, or painful or has pus in it.  You have pus draining from your skin.  You have an abscess under your skin or somewhere in your body. Get help right away if:  You have symptoms of a skin infection with a fever or chills.  You have trouble breathing.  You have chest pain.  You have a skin wound and you become nauseous or start vomiting. This information is not intended to replace advice given to you by your health care provider. Make sure you discuss any questions you have with your health care provider. Document Released: 10/13/2005 Document Revised: 03/20/2016 Document Reviewed: 08/05/2013 Elsevier Interactive Patient Education  2017 Elsevier Inc.  

## 2017-02-27 ENCOUNTER — Other Ambulatory Visit: Payer: Self-pay | Admitting: Internal Medicine

## 2017-02-27 LAB — WOUND CULTURE
GRAM STAIN: NONE SEEN
Gram Stain: NONE SEEN

## 2017-03-05 NOTE — Telephone Encounter (Signed)
3mo

## 2017-03-05 NOTE — Telephone Encounter (Signed)
l °

## 2017-03-13 ENCOUNTER — Telehealth: Payer: Self-pay | Admitting: Internal Medicine

## 2017-03-13 ENCOUNTER — Other Ambulatory Visit: Payer: Self-pay | Admitting: Internal Medicine

## 2017-03-13 ENCOUNTER — Other Ambulatory Visit: Payer: Self-pay

## 2017-03-13 DIAGNOSIS — IMO0002 Reserved for concepts with insufficient information to code with codable children: Secondary | ICD-10-CM

## 2017-03-13 DIAGNOSIS — E1165 Type 2 diabetes mellitus with hyperglycemia: Principal | ICD-10-CM

## 2017-03-13 DIAGNOSIS — E1151 Type 2 diabetes mellitus with diabetic peripheral angiopathy without gangrene: Secondary | ICD-10-CM

## 2017-03-13 MED ORDER — DULAGLUTIDE 1.5 MG/0.5ML ~~LOC~~ SOAJ: SUBCUTANEOUS | 5 refills | Status: DC

## 2017-03-13 MED ORDER — CANAGLIFLOZIN 300 MG PO TABS: ORAL_TABLET | ORAL | 2 refills | Status: DC

## 2017-03-13 NOTE — Telephone Encounter (Signed)
Pt called and said she needs her Trulicity and Invokana refilled and sent to the [facility] on [address].

## 2017-03-13 NOTE — Telephone Encounter (Signed)
Submitted

## 2017-04-01 ENCOUNTER — Ambulatory Visit (INDEPENDENT_AMBULATORY_CARE_PROVIDER_SITE_OTHER): Payer: 59 | Admitting: Internal Medicine

## 2017-04-01 ENCOUNTER — Encounter: Payer: Self-pay | Admitting: Internal Medicine

## 2017-04-01 VITALS — BP 128/82 | HR 96 | Wt 230.0 lb

## 2017-04-01 DIAGNOSIS — E1165 Type 2 diabetes mellitus with hyperglycemia: Secondary | ICD-10-CM | POA: Diagnosis not present

## 2017-04-01 DIAGNOSIS — E1151 Type 2 diabetes mellitus with diabetic peripheral angiopathy without gangrene: Secondary | ICD-10-CM

## 2017-04-01 DIAGNOSIS — IMO0002 Reserved for concepts with insufficient information to code with codable children: Secondary | ICD-10-CM

## 2017-04-01 DIAGNOSIS — N760 Acute vaginitis: Secondary | ICD-10-CM | POA: Diagnosis not present

## 2017-04-01 LAB — POCT GLYCOSYLATED HEMOGLOBIN (HGB A1C): HEMOGLOBIN A1C: 6.6

## 2017-04-01 MED ORDER — CANAGLIFLOZIN 300 MG PO TABS: ORAL_TABLET | ORAL | 3 refills | Status: DC

## 2017-04-01 MED ORDER — FLUCONAZOLE 150 MG PO TABS: ORAL_TABLET | ORAL | 1 refills | Status: DC

## 2017-04-01 MED ORDER — DULAGLUTIDE 1.5 MG/0.5ML ~~LOC~~ SOAJ: SUBCUTANEOUS | 3 refills | Status: DC

## 2017-04-01 NOTE — Progress Notes (Signed)
Patient ID: Angela Barr, female   DOB: 1961-10-16, 56 y.o.   MRN: [id number]  HPI: Angela Barr is a 56 y.o.-year-old female, returning for f/u for DM2, dx in 2007, non-insulin-dependent, uncontrolled, without long term complications, but with hyperglycemia. Last visit 5 mo ago.  Last hemoglobin A1c was: Lab Results  Component Value Date   HGBA1C 6.3 11/14/2016   HGBA1C 6.6 08/15/2016   HGBA1C 6.4 05/15/2016  09/25/2015: HbA1c 7.1%  She is now on: - Metformin XR 500 in am + 1000 mg at dinner - Amaryl 4 mg before dinner - occasionally - Invokana 300 mg daily in am - Trulicity 1.5 mg >> started to take it every 10 d b/c had some nausea with weekly dosing >> nausea better now She was on Victoza.  Pt is not checking sugars consistently, only checked 4x since last visit: - am: 119-139 >> 117, 122 >> 110-120 (130-140 in vacation) >> 108-146 >> 136, 153 - 2h after b'fast: n/c >> 97 >> n/c >> 79, 142, 203 >> 130, 151 >> n/c - before lunch: n/c >> 134 >> 110-128 >> 110s >> 110-118 >> 136 >> n/c >> 115, 116 >> n/c - 2h after lunch: n/c >> 110-120 >> n/c >> 163, 169 >> 130s >> 103, 181 >> n/c - before dinner: 140-150 >> 120-128 >> 160 (after snacks) >> 105 >> n/c >> 110 >> n/c - 2h after dinner: n/c >> 94-128 >> n/c >> 140s >> n/c >> 79, 193 - bedtime: n/c >> 200x1 >> n/c - nighttime: n/c No lows. Lowest sugar was 107 >> 108 >> 79; she has hypoglycemia awareness at 100.  Highest sugar was 135-140 >> 200 x1 >> 193  Glucometer: One Touch Verio Flex   Pt's meals are: - Breakfast: bacon and sausage, scrambled eggs, wheat or rye toast - Lunch: salads, chicken, deli sandwich - Dinner: fish, chicken, eating out 5x/week - Snacks: butter cookies, chips Stopped drinking sweat tea and sodas. Eats sunflower seeds and nuts.  - no CKD, last BUN/creatinine:  Lab Results  Component Value Date   BUN 14 02/24/2017   CREATININE 0.76 02/24/2017   Lab Results  Component Value Date   MICRALBCREAT 0.7 02/05/2016   MICRALBCREAT 0.7 12/25/2014   MICRALBCREAT 2.0 06/13/2014   MICRALBCREAT 0.7 04/04/2013   MICRALBCREAT 0.5 01/30/2012   MICRALBCREAT 0.3 06/17/2011   - we stopped Lisinopril since she had a low BP and felt dizzy at points throughout the day. ACR's >> not high in last 5 years. 24 h urine for proteins >> normal. No h/o HTN.  - last set of lipids: Lab Results  Component Value Date   CHOL 185 02/05/2016   HDL 50.80 02/05/2016   LDLCALC 122 (H) 02/05/2016   TRIG 60.0 02/05/2016   CHOLHDL 4 02/05/2016  0n Zocor. - last eye exam was in 12/2015. No DR. - denies numbness and tingling in her feet.  ROS: Constitutional: no weight gain/no weight loss, no fatigue, no subjective hyperthermia, no subjective hypothermia Eyes: no blurry vision, no xerophthalmia ENT: no sore throat, no nodules palpated in throat, no dysphagia, no odynophagia, no hoarseness Cardiovascular: no CP/no SOB/no palpitations/no leg swelling Respiratory: no cough/no SOB/no wheezing Gastrointestinal: no N/no V/no D/no C/no acid reflux Musculoskeletal: no muscle aches/no joint aches Skin: no rashes, no hair loss Neurological: no tremors/no numbness/no tingling/no dizziness  I reviewed pt's medications, allergies, PMH, social hx, family hx, and changes were documented in the history of present illness. Otherwise, unchanged from my initial  visit note.   Past Medical History:  Diagnosis Date  . Colon polyps 2008   [name]  . Diabetes mellitus 2006  . Hyperlipemia 2012  . Hypertension 2012  . Obesity    Past Surgical History:  Procedure Laterality Date  . BREAST BIOPSY  2003  . TUBAL LIGATION  1990  . VAGINAL HYSTERECTOMY  2006   History   Social History  . Marital Status: Married    Spouse Name: N/A  . Number of Children: 3   Occupational History  . RN    Social History Main Topics  . Smoking status: Never Smoker   . Smokeless tobacco: Never Used  . Alcohol  Use: No  . Drug Use: No   Social History Narrative   3 caffeine drinks daily    Current Outpatient Prescriptions on File Prior to Visit  Medication Sig Dispense Refill  . canagliflozin (INVOKANA) 300 MG TABS tablet TAKE 1 TABLET(300 MG) BY MOUTH DAILY BEFORE BREAKFAST 30 tablet 2  . Dulaglutide (TRULICITY) 1.5 IO/2.7OJ SOPN INJECT 1 SYRINGE UNDER THE SKIN ONCE A WEEK 4 pen 5  . fluconazole (DIFLUCAN) 150 MG tablet 1 po qd x1, may repeat in 3 days prn 2 tablet 1  . glimepiride (AMARYL) 4 MG tablet TAKE 1 TABLET(4 MG) BY MOUTH DAILY WITH SUPPER 30 tablet 0  . glucose blood (ONETOUCH VERIO) test strip Check your blood sugar twice daily 100 each 12  . INVOKANA 300 MG TABS tablet TAKE 1 TABLET(300 MG) BY MOUTH DAILY BEFORE BREAKFAST 30 tablet 0  . metFORMIN (GLUCOPHAGE-XR) 500 MG 24 hr tablet TAKE 1 TABLET BY MOUTH IN THE MORNING AND 2 TABLETS WITH DINNER 270 tablet 3  . mupirocin cream (BACTROBAN) 2 % Apply 1 application topically 2 (two) times daily. 15 g 0   No current facility-administered medications on file prior to visit.    Allergies  Allergen Reactions  . Bactrim Nausea Only   Family History  Problem Relation Age of Onset  . Heart failure Father   . Hypertension Mother   . Hypertension Father   . Colon cancer Father 26  . Breast cancer Maternal Grandmother   . Diabetes Father   . Diabetes Paternal Grandmother    PE: BP 128/82 (BP Location: Left Arm, Patient Position: Sitting)   Pulse 96   Wt 230 lb (104.3 kg)   SpO2 98%   BMI 38.87 kg/m  Body mass index is 38.87 kg/m. Wt Readings from Last 3 Encounters:  04/01/17 230 lb (104.3 kg)  02/24/17 231 lb 6.4 oz (105 kg)  11/14/16 227 lb (103 kg)   Constitutional: obese, in NAD Eyes: PERRLA, EOMI, no exophthalmos ENT: moist mucous membranes, no thyromegaly, no cervical lymphadenopathy Cardiovascular: tachycardia, RR, No MRG Respiratory: CTA B Gastrointestinal: abdomen soft, NT, ND, BS+ Musculoskeletal: no deformities,  strength intact in all 4 Skin: moist, warm, no rashes Neurological: no tremor with outstretched hands, DTR normal in all 4   ASSESSMENT: 1. DM2, non-insulin-dependent, uncontrolled, without long term complications, but with hyperglycemia  PLAN:  1. Patient with long-standing, uncontrolled diabetes, on oral antidiabetic regimen + Trulicity. She had pbs with yeast inf's in the past (from [place]) but not recently. However, she feels she has one now >> given a new diflucan Rx. - She is using Trulicity 1.5 mg every 10 days now >> nausea better. She would not want to decrease the dose for now. - she would like to stop some of the meds, but we discussed that  she needs to start improving diet first. She is planning to do that and I gave her suggestions. She started to exercise more consistently. - I recommended:  Patient Instructions  Please continue: - Metformin XR 500 mg in am and 1000 mg with dinner - Invokana 300 mg daily in am - Amaryl 4 mg before dinner only for large meals. You may need to decrease to 2 mg if sugars are lower after this.  You can continue Trulicity 1.5 mg in am every 10 days.  Please return in 3 months with your sugar log.   - today, HbA1c is 6.6% (slightly higher) - continue checking sugars at different times of the day - check 1x a day, rotating checks - she needs to start checking more often - advised for yearly eye exams >> she needs one - Return to clinic in 3 mo with sugar log   Philemon Kingdom, MD PhD [facility] Endocrinology

## 2017-04-01 NOTE — Patient Instructions (Addendum)
Please continue: - Metformin XR 500 mg in am and 1000 mg with dinner - Invokana 300 mg daily in am - Amaryl 4 mg before dinner only for large meals. You may need to decrease to 2 mg if sugars are lower after this.  You can continue Trulicity 1.5 mg in am every 10 days.  Please return in 3 months with your sugar log.

## 2017-04-22 ENCOUNTER — Other Ambulatory Visit: Payer: Self-pay | Admitting: Internal Medicine

## 2017-08-07 ENCOUNTER — Ambulatory Visit: Payer: 59 | Admitting: Internal Medicine

## 2017-08-11 ENCOUNTER — Encounter: Payer: Self-pay | Admitting: Internal Medicine

## 2017-08-11 ENCOUNTER — Ambulatory Visit (INDEPENDENT_AMBULATORY_CARE_PROVIDER_SITE_OTHER): Payer: 59 | Admitting: Internal Medicine

## 2017-08-11 VITALS — BP 124/72 | HR 94 | Wt 229.0 lb

## 2017-08-11 DIAGNOSIS — E669 Obesity, unspecified: Secondary | ICD-10-CM | POA: Diagnosis not present

## 2017-08-11 DIAGNOSIS — E1165 Type 2 diabetes mellitus with hyperglycemia: Secondary | ICD-10-CM | POA: Diagnosis not present

## 2017-08-11 LAB — POCT GLYCOSYLATED HEMOGLOBIN (HGB A1C): HEMOGLOBIN A1C: 6.6

## 2017-08-11 NOTE — Patient Instructions (Addendum)
Please continue: - Metformin XR 500 mg in am and 1000 mg with dinner - Invokana 300 mg daily in am - Amaryl 4 mg before dinner only for large meals, for regular meals, you may need to cut this in 2 and take only 2 mg  Please stop: - Trulicity  Please return in 3 months with your sugar log.

## 2017-08-11 NOTE — Progress Notes (Signed)
Patient ID: Angela Barr, female   DOB: Feb 20, 1961, 56 y.o.   MRN: [id number]  HPI: Angela Barr is a 56 y.o.-year-old female, returning for f/u for DM2, dx in 2007, non-insulin-dependent, uncontrolled, without long term complications, but with hyperglycemia. Last visit 4 mo ago.  Last hemoglobin A1c was: Lab Results  Component Value Date   HGBA1C 6.3 11/14/2016   HGBA1C 6.6 08/15/2016   HGBA1C 6.4 05/15/2016  09/25/2015: HbA1c 7.1%  She is now on: - Metformin XR 500 in am + 1000 mg at dinner - Amaryl 4 mg before dinner - with larger meals, 5/7 days - Invokana 300 mg daily in am >> increased urination - Trulicity 1.5 mg >> started to take it every 10 days  b/c had some nausea with weekly dosing >> nausea improved but still some present; also HAs. She was on Victoza.  Pt is not checking sugars: - am:  108-146 >> 136, 153 >> 110-124, 136 x1 (cake the night before) - 2h after b'fast: 79, 142, 203 >> 130, 151 >> n/c - before lunch: 136 >> n/c >> 115, 116 >> n/c - 2h after lunch:130s >> 103, 181 >> n/c - before dinner:  105 >> n/c >> 110 >> n/c - 2h after dinner: 140s >> n/c >> 79, 193 >> n/c - bedtime: n/c >> 200x1 >> n/c  - nighttime: n/c Lowest sugar was 79 >> 98; she has hypoglycemia awareness at 100.  Highest sugar was 193 >> 136  Glucometer: One Touch Verio Flex   Pt's meals are: - Breakfast: bacon and sausage, scrambled eggs, wheat or rye toast - Lunch: salads, chicken, deli sandwich - Dinner: fish, chicken, eating out 5x/week: [place] , etc - Snacks: butter cookies, chips Stopped drinking sweat tea and sodas. Eats sunflower seeds and nuts.  - No CKD, last BUN/creatinine:  Lab Results  Component Value Date   BUN 14 02/24/2017   CREATININE 0.76 02/24/2017   Lab Results  Component Value Date   MICRALBCREAT 0.7 02/05/2016   MICRALBCREAT 0.7 12/25/2014   MICRALBCREAT 2.0 06/13/2014   MICRALBCREAT 0.7 04/04/2013   MICRALBCREAT 0.5 01/30/2012   MICRALBCREAT 0.3  06/17/2011   - we stopped Lisinopril since she had a low BP and felt dizzy at points throughout the day. ACR's >> not high in last 5 years. 24 h urine for proteins >> normal. No h/o HTN  - + HL; last set of lipids: Lab Results  Component Value Date   CHOL 185 02/05/2016   HDL 50.80 02/05/2016   LDLCALC 122 (H) 02/05/2016   TRIG 60.0 02/05/2016   CHOLHDL 4 02/05/2016  On Zocor. - last eye exam was in 12/2016 >> No DR - Denies numbness and tingling in her feet.  ROS: Constitutional: + weight gain no fatigue, no subjective hyperthermia, no subjective hypothermia, + nocturia x3 Eyes: no blurry vision, no xerophthalmia ENT: no sore throat, no nodules palpated in throat, no dysphagia, no odynophagia, no hoarseness Cardiovascular: no CP/no SOB/no palpitations/no leg swelling Respiratory: no cough/no SOB/no wheezing Gastrointestinal: no N/no V/no D/no C/no acid reflux Musculoskeletal: no muscle aches/no joint aches Skin: no rashes, no hair loss Neurological: no tremors/no numbness/no tingling/no dizziness  I reviewed pt's medications, allergies, PMH, social hx, family hx, and changes were documented in the history of present illness. Otherwise, unchanged from my initial visit note.   Past Medical History:  Diagnosis Date  . Colon polyps 2008   [name]  . Diabetes mellitus 2006  . Hyperlipemia 2012  .  Hypertension 2012  . Obesity    Past Surgical History:  Procedure Laterality Date  . BREAST BIOPSY  2003  . TUBAL LIGATION  1990  . VAGINAL HYSTERECTOMY  2006   History   Social History  . Marital Status: Married    Spouse Name: N/A  . Number of Children: 3   Occupational History  . RN    Social History Main Topics  . Smoking status: Never Smoker   . Smokeless tobacco: Never Used  . Alcohol Use: No  . Drug Use: No   Social History Narrative   3 caffeine drinks daily    Current Outpatient Prescriptions on File Prior to Visit  Medication Sig Dispense  Refill  . canagliflozin (INVOKANA) 300 MG TABS tablet TAKE 1 TABLET(300 MG) BY MOUTH DAILY BEFORE BREAKFAST 90 tablet 3  . Dulaglutide (TRULICITY) 1.5 FA/2.1HY SOPN INJECT 1 SYRINGE UNDER THE SKIN ONCE A WEEK 12 pen 3  . fluconazole (DIFLUCAN) 150 MG tablet 1 po qd x1, may repeat in 3 days prn 2 tablet 1  . glimepiride (AMARYL) 4 MG tablet TAKE 1 TABLET(4 MG) BY MOUTH DAILY WITH SUPPER 30 tablet 0  . glimepiride (AMARYL) 4 MG tablet TAKE 1 TABLET(4 MG) BY MOUTH DAILY WITH SUPPER 90 tablet 0  . glucose blood (ONETOUCH VERIO) test strip Check your blood sugar twice daily 100 each 12  . metFORMIN (GLUCOPHAGE-XR) 500 MG 24 hr tablet TAKE 1 TABLET BY MOUTH IN THE MORNING AND 2 TABLETS WITH DINNER 270 tablet 3  . mupirocin cream (BACTROBAN) 2 % Apply 1 application topically 2 (two) times daily. 15 g 0   No current facility-administered medications on file prior to visit.    Allergies  Allergen Reactions  . Bactrim Nausea Only   Family History  Problem Relation Age of Onset  . Heart failure Father   . Hypertension Mother   . Hypertension Father   . Colon cancer Father 37  . Breast cancer Maternal Grandmother   . Diabetes Father   . Diabetes Paternal Grandmother    PE: BP 124/72 (BP Location: Left Arm, Patient Position: Sitting)   Pulse 94   Wt 229 lb (103.9 kg)   SpO2 96%   BMI 38.70 kg/m  Body mass index is 38.7 kg/m. Wt Readings from Last 3 Encounters:  08/11/17 229 lb (103.9 kg)  04/01/17 230 lb (104.3 kg)  02/24/17 231 lb 6.4 oz (105 kg)   Constitutional: obese, in NAD Eyes: PERRLA, EOMI, no exophthalmos ENT: moist mucous membranes, no thyromegaly, no cervical lymphadenopathy Cardiovascular: RRR, No MRG Respiratory: CTA B Gastrointestinal: abdomen soft, NT, ND, BS+ Musculoskeletal: no deformities, strength intact in all 4 Skin: moist, warm, no rashes Neurological: no tremor with outstretched hands, DTR normal in all 4  ASSESSMENT: 1. DM2, non-insulin-dependent, now  better controlled, without long term complications, but with hyperglycemia  2. Obesity class 2 BMI Classification:  < 18.5 underweight   18.5-24.9 normal weight   25.0-29.9 overweight   30.0-34.9 class I obesity   35.0-39.9 class II obesity   ? 40.0 class III obesity   PLAN:  1. Patient with long-standing, uncontrolled DM, but with improved control in last year. She is not checking sugars consistently so it is difficult to gauge her control now. The CBGs that she did check appear a little better c/w last visit. Her HbA1c is 6.6% (stable) today.  - She would want to come off Trulicity as she feels stomach discomfort and some HA after taking it.  I suggested to decrease the dose to 0.75 mg weekly, but she would like to try w/o it first. - she is also c/o increased urination at night with Invokana but would like to continue this for now - we can continue with 2 mg Amaryl at night for regular meals and only use 4 mg for larger meals - I recommended:  Patient Instructions  Please continue: - Metformin XR 500 mg in am and 1000 mg with dinner - Invokana 300 mg daily in am - Amaryl 4 mg before dinner only for large meals, for regular meals, you may need to cut this in 2 and take only 2 mg  Please stop: - Trulicity  Please return in 3 months with your sugar log.   - continue checking sugars at different times of the day - check 1x a day, rotating checks - advised for yearly eye exams >> she is UTD - Return to clinic in 3 mo with sugar log   2. Obesity class 2  - unfortunately, we need top stop Trulicity which was also helping with weight control - she currently has a larger b'fast and supper >> discussed about moving the larger meal at lunchtime - discussed the concept of intermittent fasting, which I think would help her: eat dinner early and do not eat anything until bedtime we discussed about   Philemon Kingdom, MD PhD [facility] Endocrinology

## 2017-08-17 ENCOUNTER — Telehealth: Payer: Self-pay | Admitting: Family Medicine

## 2017-08-17 NOTE — Telephone Encounter (Signed)
Please advise. Thank you

## 2017-08-17 NOTE — Telephone Encounter (Signed)
Pt called and was wondering if we could put her back on Trulicity as she is getting headaches, and feels blood sugar is running high. Please advise, thank you!  Call pt @ [phone]

## 2017-08-17 NOTE — Telephone Encounter (Signed)
Yes, sure - can start directly at 1.5 mg weekly.

## 2017-08-18 ENCOUNTER — Other Ambulatory Visit: Payer: Self-pay

## 2017-08-18 MED ORDER — DULAGLUTIDE 1.5 MG/0.5ML ~~LOC~~ SOAJ: SUBCUTANEOUS | 2 refills | Status: DC

## 2017-08-18 NOTE — Telephone Encounter (Signed)
Called patient and advised that I had submitted into pharmacy. No other questions.

## 2017-09-07 ENCOUNTER — Other Ambulatory Visit: Payer: Self-pay | Admitting: Internal Medicine

## 2017-09-07 DIAGNOSIS — E1151 Type 2 diabetes mellitus with diabetic peripheral angiopathy without gangrene: Secondary | ICD-10-CM

## 2017-09-07 DIAGNOSIS — E1165 Type 2 diabetes mellitus with hyperglycemia: Secondary | ICD-10-CM

## 2017-09-07 DIAGNOSIS — E0822 Diabetes mellitus due to underlying condition with diabetic chronic kidney disease: Secondary | ICD-10-CM

## 2017-09-07 DIAGNOSIS — N182 Chronic kidney disease, stage 2 (mild): Principal | ICD-10-CM

## 2017-09-07 DIAGNOSIS — N760 Acute vaginitis: Secondary | ICD-10-CM

## 2017-09-07 DIAGNOSIS — IMO0002 Reserved for concepts with insufficient information to code with codable children: Secondary | ICD-10-CM

## 2017-09-09 ENCOUNTER — Encounter: Payer: Self-pay | Admitting: Gastroenterology

## 2017-09-09 ENCOUNTER — Encounter: Payer: Self-pay | Admitting: Family Medicine

## 2017-09-09 DIAGNOSIS — Z1211 Encounter for screening for malignant neoplasm of colon: Secondary | ICD-10-CM

## 2017-09-10 ENCOUNTER — Telehealth: Payer: Self-pay | Admitting: Internal Medicine

## 2017-09-10 ENCOUNTER — Other Ambulatory Visit: Payer: Self-pay

## 2017-09-10 DIAGNOSIS — E0822 Diabetes mellitus due to underlying condition with diabetic chronic kidney disease: Secondary | ICD-10-CM

## 2017-09-10 DIAGNOSIS — N182 Chronic kidney disease, stage 2 (mild): Secondary | ICD-10-CM

## 2017-09-10 MED ORDER — METFORMIN HCL ER 500 MG PO TB24: ORAL_TABLET | ORAL | 0 refills | Status: DC

## 2017-09-10 NOTE — Telephone Encounter (Signed)
Refill has been re-sent to [facility].

## 2017-09-10 NOTE — Telephone Encounter (Signed)
Patient stated that the pharmacy haven't received her medication metformin, [id number] - [place], [place] - [address] AT [address]

## 2017-09-10 NOTE — Telephone Encounter (Signed)
Pt needs metfomin called in again [facility] states they did not receive the rx from [place] on the 13th

## 2017-09-21 ENCOUNTER — Other Ambulatory Visit: Payer: Self-pay | Admitting: Internal Medicine

## 2017-10-05 ENCOUNTER — Other Ambulatory Visit: Payer: Self-pay | Admitting: Internal Medicine

## 2017-10-05 DIAGNOSIS — IMO0002 Reserved for concepts with insufficient information to code with codable children: Secondary | ICD-10-CM

## 2017-10-05 DIAGNOSIS — E1151 Type 2 diabetes mellitus with diabetic peripheral angiopathy without gangrene: Secondary | ICD-10-CM

## 2017-10-05 DIAGNOSIS — E1165 Type 2 diabetes mellitus with hyperglycemia: Principal | ICD-10-CM

## 2017-10-13 ENCOUNTER — Telehealth: Payer: Self-pay | Admitting: Internal Medicine

## 2017-10-13 NOTE — Telephone Encounter (Signed)
Pt is aware and medication in the fridge

## 2017-10-13 NOTE — Telephone Encounter (Signed)
Please advise on below, I have not received a PA request or denial from the pharmacy

## 2017-10-13 NOTE — Telephone Encounter (Signed)
Patient was told by pharmacy that the insurance company told them they could not fill script for Trulicity-that patient would have to try something else. Patient needs medication asap-she is out of medication. [facility] [address]. Please call patient at ph# [phone] to let her know status

## 2017-10-13 NOTE — Telephone Encounter (Signed)
Let's give her 2 pens until we sort this out.

## 2017-10-15 ENCOUNTER — Ambulatory Visit: Payer: Self-pay | Admitting: *Deleted

## 2017-10-15 ENCOUNTER — Telehealth: Payer: Self-pay

## 2017-10-15 MED ORDER — SEMAGLUTIDE(0.25 OR 0.5MG/DOS) 2 MG/1.5ML ~~LOC~~ SOPN: 0.5000 mg | PEN_INJECTOR | SUBCUTANEOUS | 3 refills | Status: DC

## 2017-10-15 NOTE — Telephone Encounter (Signed)
Insurance denied Trulicity, sending in [place] to try.

## 2017-10-15 NOTE — Telephone Encounter (Signed)
FYI. Appt tomorrow.  

## 2017-10-15 NOTE — Telephone Encounter (Signed)
   Reason for Disposition . Nursing judgment or information in reference  Answer Assessment - Initial Assessment Questions 1. REASON FOR CALL: "What is your main concern right now?"     Swelling in fingers and hand. Arm feels "full". Only in the left hand. No redness or discoloration in hand- only when swollen -patient reports she feels some numbness and tingling. In the morning when patient moves her arm and holds her arm up- the swelling goes down. 2. ONSET: "When did the ___ start?"     Patient noticed swelling last week- her ring got tight. 3. SEVERITY: "How bad is the ___?"     It is bad enough that she removes her rings at night- she has been putting them on during the day- but today she felt "fullness" in her arm and hand- so she left them off 4. FEVER: "Do you have a fever?"     no 5. OTHER SYMPTOMS: "Do you have any other new symptoms?"     No pain, visual problems. 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 INTERVENTIONS AND RESPONSE: "What have you done so far to try to make this better? What medications have you used?"     Elevation has helped 7. PREGNANCY: "Is there any chance you are pregnant?"     n/a  Protocols used: NO GUIDELINE AVAILABLE-A-AH

## 2017-10-16 ENCOUNTER — Ambulatory Visit (INDEPENDENT_AMBULATORY_CARE_PROVIDER_SITE_OTHER): Payer: 59 | Admitting: Family Medicine

## 2017-10-16 ENCOUNTER — Encounter: Payer: 59 | Admitting: Gastroenterology

## 2017-10-16 ENCOUNTER — Encounter: Payer: Self-pay | Admitting: Family Medicine

## 2017-10-16 ENCOUNTER — Ambulatory Visit: Payer: 59 | Admitting: Family Medicine

## 2017-10-16 ENCOUNTER — Other Ambulatory Visit: Payer: Self-pay | Admitting: Family Medicine

## 2017-10-16 ENCOUNTER — Telehealth: Payer: Self-pay

## 2017-10-16 VITALS — BP 122/82 | HR 91 | Temp 97.7°F | Ht 64.0 in | Wt 230.2 lb

## 2017-10-16 DIAGNOSIS — M7989 Other specified soft tissue disorders: Secondary | ICD-10-CM

## 2017-10-16 DIAGNOSIS — R7401 Elevation of levels of liver transaminase levels: Secondary | ICD-10-CM

## 2017-10-16 DIAGNOSIS — R740 Nonspecific elevation of levels of transaminase and lactic acid dehydrogenase [LDH]: Principal | ICD-10-CM

## 2017-10-16 LAB — MAGNESIUM: MAGNESIUM: 1.8 mg/dL (ref 1.5–2.5)

## 2017-10-16 LAB — COMPREHENSIVE METABOLIC PANEL
ALBUMIN: 4.1 g/dL (ref 3.5–5.2)
ALK PHOS: 61 U/L (ref 39–117)
ALT: 40 U/L — AB (ref 0–35)
AST: 25 U/L (ref 0–37)
BILIRUBIN TOTAL: 0.3 mg/dL (ref 0.2–1.2)
BUN: 14 mg/dL (ref 6–23)
CALCIUM: 9.4 mg/dL (ref 8.4–10.5)
CO2: 27 mEq/L (ref 19–32)
Chloride: 104 mEq/L (ref 96–112)
Creatinine, Ser: 0.67 mg/dL (ref 0.40–1.20)
GFR: 117.49 mL/min (ref >60.00)
GLUCOSE: 101 mg/dL — AB (ref 70–99)
Potassium: 3.8 mEq/L (ref 3.5–5.1)
Sodium: 139 mEq/L (ref 135–145)
TOTAL PROTEIN: 7.1 g/dL (ref 6.0–8.3)

## 2017-10-16 LAB — CBC
HCT: 44.1 % (ref 36.0–46.0)
Hemoglobin: 13.9 g/dL (ref 12.0–15.0)
MCHC: 31.4 g/dL (ref 30.0–36.0)
MCV: 82.4 fl (ref 78.0–100.0)
PLATELETS: 227 10*3/uL (ref 150.0–400.0)
RBC: 5.35 Mil/uL — ABNORMAL HIGH (ref 3.87–5.11)
RDW: 15.5 % (ref 11.5–15.5)
WBC: 4.5 10*3/uL (ref 4.0–10.5)

## 2017-10-16 LAB — TSH: TSH: 0.49 u[IU]/mL (ref 0.35–4.50)

## 2017-10-16 NOTE — Telephone Encounter (Signed)
Ozempic is not covered per pts insurance, we switched to this after Trulicity was not covered. Please advise if PA is needed.

## 2017-10-16 NOTE — Patient Instructions (Signed)
Give [place] 2-3 business days to get the results of your labs back. If labs are normal, you will likely receive a letter in the mail unless you have MyChart. This can take longer than 2-3 business days.   Let us know if you are not getting better and we will consider other options.

## 2017-10-16 NOTE — Progress Notes (Signed)
Chief Complaint  Patient presents with  . Edema    left hand/arm     Subjective: Patient is a 57 y.o. female here for swelling in L hand and arm.  Around 1 week ago, the patient started feeling fullness in her left forearm and hand.  She believes there is swelling as well as her rings are getting tighter on her left hand at night.  This tends to wax and wane.  It may be helpful when she elevates her left arm.  There is no recent injury or change in activity precipitating this.  No recent changes in medications.  She denies any chest pain, fevers, shortness of breath, swelling in her lower extremity's, previous issue with swelling, bruising, skin changes, numbness, tingling, weakness, or pain.  She has not tried anything at home for this.  ROS: Heart: Denies chest pain  Lungs: Denies SOB   Family History  Problem Relation Age of Onset  . Heart failure Father   . Hypertension Mother   . Hypertension Father   . Colon cancer Father 36  . Breast cancer Maternal Grandmother   . Diabetes Father   . Diabetes Paternal Grandmother    Past Medical History:  Diagnosis Date  . Colon polyps 2008   [name]  . Diabetes mellitus 2006  . Hyperlipemia 2012  . Hypertension 2012  . Obesity    Allergies  Allergen Reactions  . Bactrim Nausea Only    Current Outpatient Medications:  .  canagliflozin (INVOKANA) 300 MG TABS tablet, TAKE 1 TABLET(300 MG) BY MOUTH DAILY BEFORE BREAKFAST, Disp: 90 tablet, Rfl: 3 .  Dulaglutide (TRULICITY) 1.5 LZ/7.6BH SOPN, Inject 1.5 mg weekly., Disp: 4 pen, Rfl: 2 .  fluconazole (DIFLUCAN) 150 MG tablet, TAKE 1 TABLET BY MOUTH EVERY DAY FOR 1 DOSE. MAY REPEAT IN 3 DAYS AS NEEDED, Disp: 2 tablet, Rfl: 0 .  glimepiride (AMARYL) 4 MG tablet, TAKE 1 TABLET(4 MG) BY MOUTH DAILY WITH SUPPER, Disp: 90 tablet, Rfl: 0 .  glucose blood (ONETOUCH VERIO) test strip, Check your blood sugar twice daily (Patient taking differently: Check your blood sugar twice  daily), Disp: 100 each, Rfl: 12 .  INVOKANA 300 MG TABS tablet, TAKE 1 TABLET(300 MG) BY MOUTH DAILY BEFORE BREAKFAST, Disp: 30 tablet, Rfl: 1 .  metFORMIN (GLUCOPHAGE-XR) 500 MG 24 hr tablet, TAKE 1 TABLET BY MOUTH EVERY MORNING AND 2 TABLETS WITH DINNER, Disp: 270 tablet, Rfl: 0 .  mupirocin cream (BACTROBAN) 2 %, Apply 1 application topically 2 (two) times daily., Disp: 15 g, Rfl: 0 .  Semaglutide (OZEMPIC) 0.25 or 0.5 MG/DOSE SOPN, Inject 0.5 mg into the skin once a week., Disp: 2 pen, Rfl: 3  Objective: BP 122/82 (BP Location: Left Arm, Patient Position: Sitting, Cuff Size: Large)   Pulse 91   Temp 97.7 F (36.5 C) (Oral)   Ht 5\' 4"  (1.626 m)   Wt 230 lb 4 oz (104.4 kg)   SpO2 97%   BMI 39.52 kg/m  General: Awake, appears stated age HEENT: MMM, EOMi Heart: RRR, brisk cap refill Lungs: CTAB, no rales, wheezes or rhonchi. No accessory muscle use Skin: Warm and dry, no lesions; I do not appreciate any edema, there is no pitting or excessive warmth MSK: 5/5 strength throughout, mild TTP over prox forearm extensor msc grp; no muscle grp atrophy or asymmetry Neuro: Sensation intact to light touch, Neg Spurling's, 1/4 biceps reflex b/l Psych: Age appropriate judgment and insight, normal affect and mood  Assessment and Plan:  Left upper extremity swelling - Plan: Comprehensive metabolic panel, CBC, TSH, Magnesium  Does not seem to be related to exertion/cardiac/HF. There is no suggestion of DVT or vascular insuff. Neuro exam wnl, msk exam is also untelling. 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 We agreed to get some labs and she will let us know if things fail to improve or seek care if things worsen. If no improvement, I will plan to refer her to the vascular surgery team.  F/u prn otherwise. The patient voiced understanding and agreement to the plan.  [place], DO 10/16/17  8:59  AM

## 2017-10-16 NOTE — Progress Notes (Signed)
Pre visit review using our clinic review tool, if applicable. No additional management support is needed unless otherwise documented below in the visit note. 

## 2017-10-16 NOTE — Telephone Encounter (Signed)
Yes, let;s try a PA.

## 2017-10-19 NOTE — Telephone Encounter (Signed)
PA sent today through cover my meds

## 2017-10-19 NOTE — Telephone Encounter (Signed)
The PA was denied per "plan exclusion" for the pt. Please advise

## 2017-10-21 ENCOUNTER — Telehealth: Payer: Self-pay | Admitting: Internal Medicine

## 2017-10-21 MED ORDER — EXENATIDE ER 2 MG ~~LOC~~ PEN: PEN_INJECTOR | SUBCUTANEOUS | 1 refills | Status: DC

## 2017-10-21 NOTE — Telephone Encounter (Signed)
LMTCB

## 2017-10-21 NOTE — Telephone Encounter (Signed)
We can try Bydureon 2 mg weekly or, if not covered, we can go back to Victoza 1.8 mg daily, if she agrees.

## 2017-10-21 NOTE — Telephone Encounter (Signed)
Medication sent in but [facility] for pt

## 2017-10-21 NOTE — Telephone Encounter (Signed)
Pt is returning you call regarding the medication called in

## 2017-10-21 NOTE — Telephone Encounter (Signed)
Pt is aware and is going to use [facility] incase the medication is to expensive. Will call us back and let us know

## 2017-10-21 NOTE — Telephone Encounter (Signed)
Pt returning call

## 2017-10-22 ENCOUNTER — Other Ambulatory Visit (INDEPENDENT_AMBULATORY_CARE_PROVIDER_SITE_OTHER): Payer: 59

## 2017-10-22 DIAGNOSIS — R7401 Elevation of levels of liver transaminase levels: Secondary | ICD-10-CM

## 2017-10-22 DIAGNOSIS — R740 Nonspecific elevation of levels of transaminase and lactic acid dehydrogenase [LDH]: Secondary | ICD-10-CM

## 2017-10-23 LAB — IRON,TIBC AND FERRITIN PANEL
%SAT: 22 % (calc) (ref 11–50)
Ferritin: 87 ng/mL (ref 10–232)
IRON: 67 ug/dL (ref 45–160)
TIBC: 301 ug/dL (ref 250–450)

## 2017-10-23 LAB — HEPATITIS B SURFACE ANTIGEN: HEP B S AG: NONREACTIVE

## 2017-10-29 ENCOUNTER — Telehealth: Payer: Self-pay | Admitting: Internal Medicine

## 2017-10-29 NOTE — Telephone Encounter (Signed)
Pt came in andwas  prescribed  Exenatide ER (BYDUREON) 2 MG PEN  Pt stated she does not like that medication, She has been doing the medication in thigh and pt states thigh is still sore and on her other script she had been taking before she never had that problem  Please advise

## 2017-10-29 NOTE — Telephone Encounter (Signed)
Please advise on below  

## 2017-10-29 NOTE — Telephone Encounter (Signed)
If she agree to go back to Victoza, let's send that - 1.8 mg daily in am.

## 2017-10-30 MED ORDER — LIRAGLUTIDE 18 MG/3ML ~~LOC~~ SOPN: PEN_INJECTOR | SUBCUTANEOUS | 1 refills | Status: DC

## 2017-10-30 NOTE — Telephone Encounter (Signed)
Pt agreed and medication sent

## 2017-11-02 ENCOUNTER — Ambulatory Visit: Payer: 59 | Admitting: Family Medicine

## 2017-11-02 ENCOUNTER — Encounter: Payer: Self-pay | Admitting: Family Medicine

## 2017-11-02 VITALS — BP 124/80 | HR 81 | Temp 97.7°F | Ht 64.5 in | Wt 228.0 lb

## 2017-11-02 DIAGNOSIS — B079 Viral wart, unspecified: Secondary | ICD-10-CM

## 2017-11-02 NOTE — Progress Notes (Signed)
Pre visit review using our clinic review tool, if applicable. No additional management support is needed unless otherwise documented below in the visit note. 

## 2017-11-02 NOTE — Patient Instructions (Signed)
Triple antibiotic ointment twice daily for the next 2-3 weeks until it closes off. Keep the area clean and dry.   Things to look out for: increasing pain not relieved by ibuprofen/acetaminophen, fevers, spreading redness, drainage of pus, or foul odor.  After it is healed, pare the area down and apply the compound W. Send me a pic once it heals over.   Let us know if you need anything.

## 2017-11-02 NOTE — Progress Notes (Signed)
Chief Complaint  Patient presents with  . Follow-up    wart    Angela Barr is a 57 y.o. female here for a skin complaint.  Duration: 8 days Location: R middle finger Pruritic? No Painful? Yes Drainage? No New soaps/lotions/topicals/detergents? No Sick contacts? No Other associated symptoms: Pain Therapies tried thus far: tried opening it up herself, saw red, has been using Compound W since then that causes pain, no improvement.  ROS:  Const: No fevers Skin: As noted in HPI  Past Medical History:  Diagnosis Date  . Colon polyps 2008   Angela Barr--Dr Luvenia Starch  . Diabetes mellitus 2006  . Hyperlipemia 2012  . Hypertension 2012  . Obesity    Allergies  Allergen Reactions  . Bactrim Nausea Only   Allergies as of 11/02/2017      Reactions   Bactrim Nausea Only      Medication List        Accurate as of 11/02/17 12:14 PM. Always use your most recent med list.          canagliflozin 300 MG Tabs tablet Commonly known as:  INVOKANA TAKE 1 TABLET(300 MG) BY MOUTH DAILY BEFORE BREAKFAST   INVOKANA 300 MG Tabs tablet Generic drug:  canagliflozin TAKE 1 TABLET(300 MG) BY MOUTH DAILY BEFORE BREAKFAST   fluconazole 150 MG tablet Commonly known as:  DIFLUCAN TAKE 1 TABLET BY MOUTH EVERY DAY FOR 1 DOSE. MAY REPEAT IN 3 DAYS AS NEEDED   glimepiride 4 MG tablet Commonly known as:  AMARYL TAKE 1 TABLET(4 MG) BY MOUTH DAILY WITH SUPPER   glucose blood test strip Commonly known as:  ONETOUCH VERIO Check your blood sugar twice daily   liraglutide 18 MG/3ML Sopn Commonly known as:  VICTOZA Inject 1.8 mg into skin weekly   metFORMIN 500 MG 24 hr tablet Commonly known as:  GLUCOPHAGE-XR TAKE 1 TABLET BY MOUTH EVERY MORNING AND 2 TABLETS WITH DINNER   mupirocin cream 2 % Commonly known as:  BACTROBAN Apply 1 application topically 2 (two) times daily.       BP 124/80 (BP Location: Left Arm, Patient Position: Sitting, Cuff Size: Large)   Pulse 81   Temp 97.7 F  (36.5 C) (Oral)   Ht 5' 4.5" (1.638 m)   Wt 228 lb (103.4 kg)   SpO2 97%   BMI 38.53 kg/m  Gen: awake, alert, appearing stated age Lungs: No accessory muscle use Skin: See below. No drainage, erythema, fluctuance Psych: Age appropriate judgment and insight  Media Information    Viral warts, unspecified type  TAO BID for 10-14 days to heal open area. Then start tx for wart. F/u if no better, I would like to see it in a couple weeks when the skin grows back. The patient voiced understanding and agreement to the plan.  [place], DO 11/02/17 12:14 PM

## 2017-11-04 ENCOUNTER — Encounter: Payer: Self-pay | Admitting: Gastroenterology

## 2017-11-05 ENCOUNTER — Other Ambulatory Visit: Payer: Self-pay

## 2017-11-05 MED ORDER — LIRAGLUTIDE 18 MG/3ML ~~LOC~~ SOPN: PEN_INJECTOR | SUBCUTANEOUS | 1 refills | Status: DC

## 2017-11-06 ENCOUNTER — Telehealth: Payer: Self-pay | Admitting: Internal Medicine

## 2017-11-06 ENCOUNTER — Telehealth: Payer: Self-pay

## 2017-11-06 NOTE — Telephone Encounter (Signed)
Per message created 11/06/17 with Prior Auth in visit info, we are working on a PA for Angela Barr. Pt is aware of this and knows that we will call her when we hear back from her insurance company.

## 2017-11-06 NOTE — Telephone Encounter (Signed)
[employer] states that this is a covered medication and that no PA is required. Pt states that this medication and Trulicity cost to much for her to be able to afford. Please advise.

## 2017-11-06 NOTE — Telephone Encounter (Signed)
Received PA request for Victoza on patient. Pt did not like Bydueon so on 10/29/17 we changed back to the Victoza. Ozempic is listed as a plan exclusion so that medication is not covered either. Please advise  KEY: [id number]

## 2017-11-06 NOTE — Telephone Encounter (Signed)
OK to start a PA for Victoza mentioning that pt did not tolerate Bydureon.

## 2017-11-06 NOTE — Telephone Encounter (Signed)
She is coming back on January 16, I will talk to her then.

## 2017-11-06 NOTE — Telephone Encounter (Signed)
PA has been started

## 2017-11-06 NOTE — Telephone Encounter (Signed)
Patient stated medication Truilicity and victoza was to expensive, is there another alternative please advise

## 2017-11-09 NOTE — Telephone Encounter (Signed)
Patient stated that she is only taking the metformin, and the glimepiride (AMARYL).  All her other medications the insurance will not cover, it is to expensive, is there any other alternative, please advise

## 2017-11-09 NOTE — Telephone Encounter (Signed)
Will discuss at appt on Wednesday and pt is aware

## 2017-11-11 ENCOUNTER — Ambulatory Visit: Payer: 59 | Admitting: Internal Medicine

## 2017-11-11 ENCOUNTER — Encounter: Payer: Self-pay | Admitting: Internal Medicine

## 2017-11-11 VITALS — BP 140/82 | HR 82 | Ht 64.5 in | Wt 230.8 lb

## 2017-11-11 DIAGNOSIS — E669 Obesity, unspecified: Secondary | ICD-10-CM

## 2017-11-11 DIAGNOSIS — E785 Hyperlipidemia, unspecified: Secondary | ICD-10-CM

## 2017-11-11 DIAGNOSIS — E1165 Type 2 diabetes mellitus with hyperglycemia: Secondary | ICD-10-CM | POA: Diagnosis not present

## 2017-11-11 LAB — POCT GLYCOSYLATED HEMOGLOBIN (HGB A1C): Hemoglobin A1C: 6.6

## 2017-11-11 NOTE — Progress Notes (Signed)
Patient ID: Angela Barr, female   DOB: 1961/08/02, 57 y.o.   MRN: [id number]  HPI: Angela Barr is a 57 y.o.-year-old female, returning for f/u for DM2, dx in 2007, non-insulin-dependent, uncontrolled, without long term complications, but with hyperglycemia. Last visit 3 mo ago.  Since last visit, she came off both Trulicity (for both side effects and price) and Invokana (out 1 week ago -too expensive to refill).  Last hemoglobin A1c was: Lab Results  Component Value Date   HGBA1C 6.6 08/11/2017   HGBA1C 6.6 04/01/2017   HGBA1C 6.3 11/14/2016  09/25/2015: HbA1c 7.1%  She is now on: - Metformin XR 500 in am + 1000 mg at dinner - Amaryl 4 mg before dinner - with larger meals only She was on Invokana 300 mg daily in am >> increased urination; ran out 1 week ago, but too expensive to restart Stopped Trulicity 1.5 mg >> stopped b/c Nausea and HAs, and also expensive She was Bydureon >> lumps at inj. Site.  She was on Victoza, now too expensive.  Pt is checking sugars 0-1x a day: - am:  108-146 >> 136, 153 >> 110-124, 136 x1 (cake the night before) >> 150-158, 168 after cantaloupe - 2h after b'fast: 79, 142, 203 >> 130, 151 >> n/c - before lunch: 136 >> n/c >> 115, 116 >> n/c - 2h after lunch:130s >> 103, 181 >> n/c - before dinner:  105 >> n/c >> 110 >> n/c >> 131 - 2h after dinner: 140s >> n/c >> 79, 193 >> n/c - bedtime: n/c >> 200x1 >> n/c  - nighttime: n/c Lowest sugar was 79 >> 98 >> 131; she has hypoglycemia awareness at 100.  Highest sugar was 193 >> 136 >> 168  Glucometer: One Touch Verio Flex   Pt's meals are: - Breakfast: bacon and sausage, scrambled eggs, wheat or rye toast - Lunch: salads, chicken, deli sandwich - Dinner: fish, chicken, eating out 5x/week: [place] , etc - Snacks: butter cookies, chips Stopped drinking sweat tea and sodas. Eats sunflower seeds and nuts.  - No CKD, last BUN/creatinine:  Lab Results  Component Value Date   BUN 14 10/16/2017    CREATININE 0.67 10/16/2017   Lab Results  Component Value Date   MICRALBCREAT 0.7 02/05/2016   MICRALBCREAT 0.7 12/25/2014   MICRALBCREAT 2.0 06/13/2014   MICRALBCREAT 0.7 04/04/2013   MICRALBCREAT 0.5 01/30/2012   MICRALBCREAT 0.3 06/17/2011   - we stopped Lisinopril since she had a low BP and felt dizzy at points throughout the day. ACR's >> not high in last 5 years. 24 h urine for proteins >> normal. No h/o HTN.  - + HL; last set of lipids: Lab Results  Component Value Date   CHOL 185 02/05/2016   HDL 50.80 02/05/2016   LDLCALC 122 (H) 02/05/2016   TRIG 60.0 02/05/2016   CHOLHDL 4 02/05/2016  On Zocor. - last eye exam was in 12/2016 >> No DR - No numbness and tingling in her feet.  ROS: Constitutional: no weight gain/no weight loss, no fatigue, no subjective hyperthermia, no subjective hypothermia Eyes: no blurry vision, no xerophthalmia ENT: no sore throat, no nodules palpated in throat, no dysphagia, no odynophagia, no hoarseness Cardiovascular: no CP/no SOB/no palpitations/no leg swelling Respiratory: no cough/no SOB/no wheezing Gastrointestinal: no N/no V/no D/no C/no acid reflux Musculoskeletal: no muscle aches/no joint aches Skin: no rashes, no hair loss Neurological: no tremors/no numbness/no tingling/no dizziness  I reviewed pt's medications, allergies, PMH, social hx, family hx, and  changes were documented in the history of present illness. Otherwise, unchanged from my initial visit note.   Past Medical History:  Diagnosis Date  . Colon polyps 2008   [name]  . Diabetes mellitus 2006  . Hyperlipemia 2012  . Hypertension 2012  . Obesity    Past Surgical History:  Procedure Laterality Date  . BREAST BIOPSY  2003  . TUBAL LIGATION  1990  . VAGINAL HYSTERECTOMY  2006   History   Social History  . Marital Status: Married    Spouse Name: N/A  . Number of Children: 3   Occupational History  . RN    Social History Main Topics  .  Smoking status: Never Smoker   . Smokeless tobacco: Never Used  . Alcohol Use: No  . Drug Use: No   Social History Narrative   3 caffeine drinks daily    Current Outpatient Medications on File Prior to Visit  Medication Sig Dispense Refill  . canagliflozin (INVOKANA) 300 MG TABS tablet TAKE 1 TABLET(300 MG) BY MOUTH DAILY BEFORE BREAKFAST 90 tablet 3  . fluconazole (DIFLUCAN) 150 MG tablet TAKE 1 TABLET BY MOUTH EVERY DAY FOR 1 DOSE. MAY REPEAT IN 3 DAYS AS NEEDED 2 tablet 0  . glimepiride (AMARYL) 4 MG tablet TAKE 1 TABLET(4 MG) BY MOUTH DAILY WITH SUPPER 90 tablet 0  . glucose blood (ONETOUCH VERIO) test strip Check your blood sugar twice daily (Patient taking differently: Check your blood sugar twice daily) 100 each 12  . INVOKANA 300 MG TABS tablet TAKE 1 TABLET(300 MG) BY MOUTH DAILY BEFORE BREAKFAST 30 tablet 1  . liraglutide (VICTOZA) 18 MG/3ML SOPN Inject 1.8 mg into skin daily in the AM 4 pen 1  . metFORMIN (GLUCOPHAGE-XR) 500 MG 24 hr tablet TAKE 1 TABLET BY MOUTH EVERY MORNING AND 2 TABLETS WITH DINNER 270 tablet 0  . mupirocin cream (BACTROBAN) 2 % Apply 1 application topically 2 (two) times daily. 15 g 0   No current facility-administered medications on file prior to visit.    Allergies  Allergen Reactions  . Bactrim Nausea Only   Family History  Problem Relation Age of Onset  . Heart failure Father   . Hypertension Mother   . Hypertension Father   . Colon cancer Father 61  . Breast cancer Maternal Grandmother   . Diabetes Father   . Diabetes Paternal Grandmother    PE: BP 140/82   Pulse 82   Ht 5' 4.5" (1.638 m)   Wt 230 lb 12.8 oz (104.7 kg)   SpO2 98%   BMI 39.00 kg/m  Body mass index is 39 kg/m. Wt Readings from Last 3 Encounters:  11/11/17 230 lb 12.8 oz (104.7 kg)  11/02/17 228 lb (103.4 kg)  10/16/17 230 lb 4 oz (104.4 kg)   Constitutional: overweight, in NAD Eyes: PERRLA, EOMI, no exophthalmos ENT: moist mucous membranes, no thyromegaly, no  cervical lymphadenopathy Cardiovascular: RRR, No MRG Respiratory: CTA B Gastrointestinal: abdomen soft, NT, ND, BS+ Musculoskeletal: no deformities, strength intact in all 4 Skin: moist, warm, no rashes Neurological: no tremor with outstretched hands, DTR normal in all 4  ASSESSMENT: 1. DM2, non-insulin-dependent, now better controlled, without long term complications, but with hyperglycemia  2. Obesity class 2 BMI Classification:  < 18.5 underweight   18.5-24.9 normal weight   25.0-29.9 overweight   30.0-34.9 class I obesity   35.0-39.9 class II obesity  ? 40.0 class III obesity   3. HL  PLAN:  1. Patient  with long-standing, uncontrolled, diabetes, but with improved control in the last year.  She was doing well on Trulicity, Invokana, and metformin, however, Trulicity and Invokana are now expensive.  She ran out of Invokana approximately 1 week ago.  Sugars checked since then are higher in the morning, in the 150s and she has 1 sugar check before dinner which is 131, better than in a.m.   - At this visit, we discussed that we can continue only on metformin and Amaryl, but she needs to improve her diet.  We discussed at length about options for healthier diet and I strongly recommended that she cut out fat to reduce her insulin resistance.  Given examples of healthier foods and also given references. - For now, I advised her to continue metformin and starts taking a low dose Amaryl before regular dinners (2 mg) continue the 4 mg before large her dinners. - I recommended:  Patient Instructions  Please continue: - Metformin XR 500 mg in am and 1000 mg with dinner - Amaryl 4 mg before dinner for large meals, for regular meals, 2 mg  Please look up: Rip Esselstyn - The Engine 2 diet   Rip Esselstyn - The Engine 2 7-day Rescue diet  Also look at: Demetra Shiner, PhD - Bright Line Eating  Please return in 3 months with your sugar log.   - today, HbA1c is 6.6%  (stable) - continue checking sugars at different times of the day - check 1x a day, rotating checks - advised for yearly eye exams >> she is UTD - Return to clinic in 3 mo with sugar log   2. Obesity class 2  - Weight stable since last visit -  Unfortunately, she came off both Trulicity and Invokana due to price.  Both of these were helping her weight, also. - We discussed about healthier food choices and I made specific suggestions.  Also given her references. - Discussed the advantages of a plant-based diet.  3. HL - Reviewed latest lipid panel from 01/2016: LDL 122, high - She is due for another one-has an annual physical exam by PCP coming up in 1 month - Discussed dietary changes - Continue Zocor.  No side effects.  Philemon Kingdom, MD PhD [facility]

## 2017-11-11 NOTE — Patient Instructions (Addendum)
Please continue: - Metformin XR 500 mg in am and 1000 mg with dinner - Amaryl 4 mg before dinner for large meals, for regular meals, 2 mg  Please look up: Rip Esselstyn - The Engine 2 diet   Rip Esselstyn - The Engine 2 7-day Rescue diet  Also look at: Demetra Shiner, PhD - Bright Line Eating  Please return in 3 months with your sugar log.

## 2017-11-11 NOTE — Addendum Note (Signed)
Addended by: Drucilla Schmidt on: 11/11/2017 12:56 PM   Modules accepted: Orders

## 2017-11-12 ENCOUNTER — Telehealth: Payer: Self-pay

## 2017-11-12 NOTE — Telephone Encounter (Signed)
Please advise on below I know it was being discussed at her appt what she would be taking

## 2017-11-12 NOTE — Telephone Encounter (Signed)
James from [facility] called and stated Victoza needs PA form different from the one that was previously done he would like to give some assistance on getting this covered for the patient please call him back at [phone] and the reference key for this PA is [facility]

## 2017-11-12 NOTE — Telephone Encounter (Signed)
We will try the diet first and at the next visit, if Victoza needed, we can fax the PA then.

## 2017-12-02 ENCOUNTER — Telehealth: Payer: Self-pay

## 2017-12-02 NOTE — Telephone Encounter (Signed)
Please advise on below  

## 2017-12-02 NOTE — Telephone Encounter (Signed)
Patient called today stating her bs has been running high [phone] These are all fasting morning bs- patient would like to know if she needs to increase any of her medications please advise- patient would like a return call as soon as possible

## 2017-12-03 NOTE — Telephone Encounter (Signed)
Let us try to move all metformin XR with dinner (1500 mg), and also make sure she takes 4 mg (instead of 2 mg) of Amaryl before dinner.  Let us know about the sugars in few days.

## 2017-12-03 NOTE — Telephone Encounter (Signed)
Pt is aware and states that she will try this dose change

## 2017-12-04 ENCOUNTER — Other Ambulatory Visit: Payer: Self-pay | Admitting: Internal Medicine

## 2017-12-04 DIAGNOSIS — N182 Chronic kidney disease, stage 2 (mild): Principal | ICD-10-CM

## 2017-12-04 DIAGNOSIS — E0822 Diabetes mellitus due to underlying condition with diabetic chronic kidney disease: Secondary | ICD-10-CM

## 2017-12-07 ENCOUNTER — Telehealth: Payer: Self-pay | Admitting: Internal Medicine

## 2017-12-07 NOTE — Telephone Encounter (Signed)
Patient stated her b/s sugar are still high in the morning,187and 182 the medication metformin and glipizide is not bringing it down. Please advise

## 2017-12-07 NOTE — Telephone Encounter (Signed)
Please advise on below  

## 2017-12-07 NOTE — Telephone Encounter (Signed)
Let's try to start Jardiance 10 mg before b/fast. I hope it is covered. It is in the same family with Invokana but less aggressive.

## 2017-12-08 MED ORDER — EMPAGLIFLOZIN 10 MG PO TABS: 10.0000 mg | ORAL_TABLET | Freq: Every day | ORAL | 0 refills | Status: DC

## 2017-12-08 NOTE — Telephone Encounter (Signed)
Jardiance was sent to pharmacy, [facility]

## 2017-12-08 NOTE — Telephone Encounter (Signed)
Angela Barr upfront informed pt

## 2018-01-08 ENCOUNTER — Encounter: Payer: Self-pay | Admitting: Gastroenterology

## 2018-01-08 ENCOUNTER — Ambulatory Visit (AMBULATORY_SURGERY_CENTER): Payer: Self-pay | Admitting: *Deleted

## 2018-01-08 ENCOUNTER — Other Ambulatory Visit: Payer: Self-pay

## 2018-01-08 VITALS — Ht 65.0 in | Wt 234.0 lb

## 2018-01-08 DIAGNOSIS — Z86010 Personal history of colonic polyps: Secondary | ICD-10-CM

## 2018-01-08 DIAGNOSIS — Z800 Family history of malignant neoplasm of digestive organs: Secondary | ICD-10-CM

## 2018-01-08 MED ORDER — NA SULFATE-K SULFATE-MG SULF 17.5-3.13-1.6 GM/177ML PO SOLN: 1.0000 | Freq: Once | ORAL | 0 refills | Status: AC

## 2018-01-08 NOTE — Progress Notes (Signed)
No egg or soy allergy known to patient  No issues with past sedation with any surgeries  or procedures, no intubation problems  No diet pills per patient No home 02 use per patient  No blood thinners per patient  Pt denies issues with constipation  No A fib or A flutter  EMMI video sent to pt's e mail - pt declined  Pt given $15 coupon for suprep in [place] today

## 2018-01-11 ENCOUNTER — Telehealth: Payer: Self-pay | Admitting: Internal Medicine

## 2018-01-11 NOTE — Telephone Encounter (Signed)
Patient states BS is still running high and still having headaches,  She would like to know if Dr would like to see her sooner or change her medication.   She states her BS was 198 around lunch 2 hours after she ate.    Please advise

## 2018-01-12 NOTE — Telephone Encounter (Signed)
Let us try to have her increase the Amaryl to 4 mg twice a day, and she is now using 2 mg before regular meals.  Let me know if she has any lows

## 2018-01-13 ENCOUNTER — Telehealth: Payer: Self-pay | Admitting: Internal Medicine

## 2018-01-13 NOTE — Telephone Encounter (Signed)
Called pt. No answer. Left vmail per DPR.

## 2018-01-13 NOTE — Telephone Encounter (Signed)
Patient returned your call. Please call her at ph# [phone]

## 2018-01-14 NOTE — Telephone Encounter (Signed)
Patient returned call. She states she was taking amaryl 4mg  tab @ hs. Advised of new med instructions. Pt verbalized understanding.

## 2018-01-22 ENCOUNTER — Other Ambulatory Visit: Payer: Self-pay | Admitting: Internal Medicine

## 2018-01-22 ENCOUNTER — Encounter: Payer: 59 | Admitting: Gastroenterology

## 2018-01-25 ENCOUNTER — Telehealth: Payer: Self-pay | Admitting: Internal Medicine

## 2018-01-25 ENCOUNTER — Other Ambulatory Visit: Payer: Self-pay | Admitting: Internal Medicine

## 2018-01-25 ENCOUNTER — Telehealth: Payer: Self-pay | Admitting: *Deleted

## 2018-01-25 MED ORDER — GLIMEPIRIDE 4 MG PO TABS: 4.0000 mg | ORAL_TABLET | Freq: Two times a day (BID) | ORAL | 0 refills | Status: DC

## 2018-01-25 NOTE — Telephone Encounter (Signed)
Need refill of glimepiride  [facility] [address] [place], [place] [address] Phone: [phone]  [place]

## 2018-01-25 NOTE — Telephone Encounter (Signed)
Need refill of glimepiride   [facility] [address], [place], [place] [address] Phone: [phone]  New pharmacy    Patient stated 4MG  twice a day instead of the one just at supper time.  Patient stated in order for pharmacy to fill the correct instructions need to be on this medication .

## 2018-01-25 NOTE — Telephone Encounter (Signed)
Received [employer] Message, but error in SIG: did not give patient enough medication to get through the weekend; changed by Dr. Cruzita Lederer on 01/12/18 from [2mg  tablet once daily] increase to 4mg  tablet to be taken BID. Forwarded message with [employer] and notes after speaking with Jordan/SLS 04/01  [employer] note received via fax  Call:   Date: 01/23/18 Time:   Caller: Keierra Nudo Return number: [phone] Primary [phone] Secondary  Nurse: Bettye Boeck, RN  Chief Complaint:Prescription Refill or Medication Request  Reason for call: Symptomatic/Request for Health Information  Related visit to physician within the last 2 weeks: phone messages on 01/11/18, 01/14/18, 01/25/18  Guideline: Offered Triage, patient refused  Disposition: 01/23/18 10:17, pharmacy call, [facility] [phone], refill Glimepiride x3 days worth

## 2018-01-25 NOTE — Telephone Encounter (Signed)
Sent rx as requested

## 2018-01-26 ENCOUNTER — Ambulatory Visit (INDEPENDENT_AMBULATORY_CARE_PROVIDER_SITE_OTHER): Payer: 59 | Admitting: Family Medicine

## 2018-01-26 ENCOUNTER — Encounter: Payer: Self-pay | Admitting: Family Medicine

## 2018-01-26 VITALS — BP 138/86 | HR 86 | Temp 98.0°F | Resp 16 | Ht 65.0 in | Wt 233.4 lb

## 2018-01-26 DIAGNOSIS — R8290 Unspecified abnormal findings in urine: Secondary | ICD-10-CM | POA: Diagnosis not present

## 2018-01-26 DIAGNOSIS — E785 Hyperlipidemia, unspecified: Secondary | ICD-10-CM | POA: Diagnosis not present

## 2018-01-26 DIAGNOSIS — E2839 Other primary ovarian failure: Secondary | ICD-10-CM

## 2018-01-26 DIAGNOSIS — E1165 Type 2 diabetes mellitus with hyperglycemia: Secondary | ICD-10-CM

## 2018-01-26 DIAGNOSIS — E669 Obesity, unspecified: Secondary | ICD-10-CM | POA: Diagnosis not present

## 2018-01-26 DIAGNOSIS — I10 Essential (primary) hypertension: Secondary | ICD-10-CM | POA: Diagnosis not present

## 2018-01-26 DIAGNOSIS — J3489 Other specified disorders of nose and nasal sinuses: Secondary | ICD-10-CM

## 2018-01-26 DIAGNOSIS — R350 Frequency of micturition: Secondary | ICD-10-CM | POA: Diagnosis not present

## 2018-01-26 DIAGNOSIS — Z0000 Encounter for general adult medical examination without abnormal findings: Secondary | ICD-10-CM | POA: Diagnosis not present

## 2018-01-26 DIAGNOSIS — Z6838 Body mass index (BMI) 38.0-38.9, adult: Secondary | ICD-10-CM

## 2018-01-26 LAB — CBC WITH DIFFERENTIAL/PLATELET
BASOS ABS: 0 10*3/uL (ref 0.0–0.1)
Basophils Relative: 1 % (ref 0.0–3.0)
EOS ABS: 0.1 10*3/uL (ref 0.0–0.7)
Eosinophils Relative: 1.5 % (ref 0.0–5.0)
HCT: 43 % (ref 36.0–46.0)
Hemoglobin: 13.9 g/dL (ref 12.0–15.0)
LYMPHS ABS: 1.6 10*3/uL (ref 0.7–4.0)
Lymphocytes Relative: 38.3 % (ref 12.0–46.0)
MCHC: 32.3 g/dL (ref 30.0–36.0)
MCV: 80.4 fl (ref 78.0–100.0)
MONO ABS: 0.3 10*3/uL (ref 0.1–1.0)
Monocytes Relative: 6.8 % (ref 3.0–12.0)
NEUTROS ABS: 2.2 10*3/uL (ref 1.4–7.7)
NEUTROS PCT: 52.4 % (ref 43.0–77.0)
PLATELETS: 221 10*3/uL (ref 150.0–400.0)
RBC: 5.35 Mil/uL — AB (ref 3.87–5.11)
RDW: 15.7 % — ABNORMAL HIGH (ref 11.5–15.5)
WBC: 4.2 10*3/uL (ref 4.0–10.5)

## 2018-01-26 LAB — LIPID PANEL
CHOL/HDL RATIO: 3
Cholesterol: 181 mg/dL (ref 0–200)
HDL: 54.6 mg/dL (ref >39.00)
LDL Cholesterol: 113 mg/dL — ABNORMAL HIGH (ref 0–99)
NONHDL: 126.51
Triglycerides: 66 mg/dL (ref 0.0–149.0)
VLDL: 13.2 mg/dL (ref 0.0–40.0)

## 2018-01-26 LAB — COMPREHENSIVE METABOLIC PANEL
ALT: 36 U/L — AB (ref 0–35)
AST: 22 U/L (ref 0–37)
Albumin: 4 g/dL (ref 3.5–5.2)
Alkaline Phosphatase: 74 U/L (ref 39–117)
BILIRUBIN TOTAL: 0.4 mg/dL (ref 0.2–1.2)
BUN: 14 mg/dL (ref 6–23)
CO2: 28 meq/L (ref 19–32)
Calcium: 9.6 mg/dL (ref 8.4–10.5)
Chloride: 105 mEq/L (ref 96–112)
Creatinine, Ser: 0.55 mg/dL (ref 0.40–1.20)
GFR: 147.39 mL/min (ref >60.00)
Glucose, Bld: 145 mg/dL — ABNORMAL HIGH (ref 70–99)
Potassium: 4 mEq/L (ref 3.5–5.1)
SODIUM: 140 meq/L (ref 135–145)
TOTAL PROTEIN: 7.2 g/dL (ref 6.0–8.3)

## 2018-01-26 LAB — POC URINALSYSI DIPSTICK (AUTOMATED)
BILIRUBIN UA: NEGATIVE
KETONES UA: NEGATIVE
Leukocytes, UA: NEGATIVE
NITRITE UA: POSITIVE
PH UA: 6 (ref 5.0–8.0)
PROTEIN UA: NEGATIVE
RBC UA: NEGATIVE
Spec Grav, UA: >=1.03 — AB (ref 1.010–1.025)
Urobilinogen, UA: 0.2 E.U./dL

## 2018-01-26 MED ORDER — MUPIROCIN 2 % EX OINT: 1.0000 "application " | TOPICAL_OINTMENT | Freq: Two times a day (BID) | CUTANEOUS | 0 refills | Status: DC

## 2018-01-26 NOTE — Telephone Encounter (Signed)
Called pt. No answer °

## 2018-01-26 NOTE — Assessment & Plan Note (Signed)
Well controlled, no changes to meds. Encouraged heart healthy diet such as the DASH diet and exercise as tolerated.  Off meds -- due to dizziness and low bp

## 2018-01-26 NOTE — Patient Instructions (Signed)
Preventive Care 40-64 Years, Female Preventive care refers to lifestyle choices and visits with your health care provider that can promote health and wellness. What does preventive care include?  A yearly physical exam. This is also called an annual well check.  Dental exams once or twice a year.  Routine eye exams. Ask your health care provider how often you should have your eyes checked.  Personal lifestyle choices, including: ? Daily care of your teeth and gums. ? Regular physical activity. ? Eating a healthy diet. ? Avoiding tobacco and drug use. ? Limiting alcohol use. ? Practicing safe sex. ? Taking low-dose aspirin daily starting at age 58. ? Taking vitamin and mineral supplements as recommended by your health care provider. What happens during an annual well check? The services and screenings done by your health care provider during your annual well check will depend on your age, overall health, lifestyle risk factors, and family history of disease. Counseling Your health care provider may ask you questions about your:  Alcohol use.  Tobacco use.  Drug use.  Emotional well-being.  Home and relationship well-being.  Sexual activity.  Eating habits.  Work and work Statistician.  Method of birth control.  Menstrual cycle.  Pregnancy history.  Screening You may have the following tests or measurements:  Height, weight, and BMI.  Blood pressure.  Lipid and cholesterol levels. These may be checked every 5 years, or more frequently if you are over 81 years old.  Skin check.  Lung cancer screening. You may have this screening every year starting at age 78 if you have a 30-pack-year history of smoking and currently smoke or have quit within the past 15 years.  Fecal occult blood test (FOBT) of the stool. You may have this test every year starting at age 65.  Flexible sigmoidoscopy or colonoscopy. You may have a sigmoidoscopy every 5 years or a colonoscopy  every 10 years starting at age 30.  Hepatitis C blood test.  Hepatitis B blood test.  Sexually transmitted disease (STD) testing.  Diabetes screening. This is done by checking your blood sugar (glucose) after you have not eaten for a while (fasting). You may have this done every 1-3 years.  Mammogram. This may be done every 1-2 years. Talk to your health care provider about when you should start having regular mammograms. This may depend on whether you have a family history of breast cancer.  BRCA-related cancer screening. This may be done if you have a family history of breast, ovarian, tubal, or peritoneal cancers.  Pelvic exam and Pap test. This may be done every 3 years starting at age 80. Starting at age 36, this may be done every 5 years if you have a Pap test in combination with an HPV test.  Bone density scan. This is done to screen for osteoporosis. You may have this scan if you are at high risk for osteoporosis.  Discuss your test results, treatment options, and if necessary, the need for more tests with your health care provider. Vaccines Your health care provider may recommend certain vaccines, such as:  Influenza vaccine. This is recommended every year.  Tetanus, diphtheria, and acellular pertussis (Tdap, Td) vaccine. You may need a Td booster every 10 years.  Varicella vaccine. You may need this if you have not been vaccinated.  Zoster vaccine. You may need this after age 5.  Measles, mumps, and rubella (MMR) vaccine. You may need at least one dose of MMR if you were born in  1957 or later. You may also need a second dose.  Pneumococcal 13-valent conjugate (PCV13) vaccine. You may need this if you have certain conditions and were not previously vaccinated.  Pneumococcal polysaccharide (PPSV23) vaccine. You may need one or two doses if you smoke cigarettes or if you have certain conditions.  Meningococcal vaccine. You may need this if you have certain  conditions.  Hepatitis A vaccine. You may need this if you have certain conditions or if you travel or work in places where you may be exposed to hepatitis A.  Hepatitis B vaccine. You may need this if you have certain conditions or if you travel or work in places where you may be exposed to hepatitis B.  Haemophilus influenzae type b (Hib) vaccine. You may need this if you have certain conditions.  Talk to your health care provider about which screenings and vaccines you need and how often you need them. This information is not intended to replace advice given to you by your health care provider. Make sure you discuss any questions you have with your health care provider. Document Released: 11/09/2015 Document Revised: 07/02/2016 Document Reviewed: 08/14/2015 Elsevier Interactive Patient Education  2018 Elsevier Inc.  

## 2018-01-26 NOTE — Assessment & Plan Note (Signed)
Per endo °

## 2018-01-26 NOTE — Progress Notes (Signed)
Subjective:     Angela Barr is a 57 y.o. female and is here for a comprehensive physical exam. The patient reports no problems.--except she is still getting the sores in her nose   Social History   Socioeconomic History  . Marital status: Married    Spouse name: Not on file  . Number of children: 3  . Years of education: Not on file  . Highest education level: Not on file  Occupational History  . Occupation: Therapist, sports  Social Needs  . Financial resource strain: Not on file  . Food insecurity:    Worry: Not on file    Inability: Not on file  . Transportation needs:    Medical: Not on file    Non-medical: Not on file  Tobacco Use  . Smoking status: Never Smoker  . Smokeless tobacco: Never Used  Substance and Sexual Activity  . Alcohol use: No  . Drug use: No  . Sexual activity: Not on file  Lifestyle  . Physical activity:    Days per week: Not on file    Minutes per session: Not on file  . Stress: Not on file  Relationships  . Social connections:    Talks on phone: Not on file    Gets together: Not on file    Attends religious service: Not on file    Active member of club or organization: Not on file    Attends meetings of clubs or organizations: Not on file    Relationship status: Not on file  . Intimate partner violence:    Fear of current or ex partner: Not on file    Emotionally abused: Not on file    Physically abused: Not on file    Forced sexual activity: Not on file  Other Topics Concern  . Not on file  Social History Narrative   3 caffeine drinks daily.   Exercise-no   Health Maintenance  Topic Date Due  . COLONOSCOPY  09/22/1980  . OPHTHALMOLOGY EXAM  01/22/2017  . MAMMOGRAM  02/03/2017  . URINE MICROALBUMIN  02/04/2017  . TETANUS/TDAP  06/15/2017  . FOOT EXAM  11/14/2017  . PNEUMOCOCCAL POLYSACCHARIDE VACCINE (2) 01/24/2018  . HEMOGLOBIN A1C  05/11/2018  . INFLUENZA VACCINE  05/27/2018  . Hepatitis C Screening  Completed  . HIV Screening   Completed    The following portions of the patient's history were reviewed and updated as appropriate:  She  has a past medical history of Arthritis, Colon polyps (2008), Diabetes mellitus (2006), Hyperlipemia (2012), Hypertension (2012), and Obesity. She does not have any pertinent problems on file. She  has a past surgical history that includes Vaginal hysterectomy (2006); Breast biopsy (2003); Tubal ligation (1990); Colonoscopy; and Polypectomy. Her family history includes Breast cancer in her maternal grandmother; Colon cancer (age of onset: 100) in her father; Diabetes in her father and paternal grandmother; Heart failure in her father; Hypertension in her father and mother. She  reports that she has never smoked. She has never used smokeless tobacco. She reports that she does not drink alcohol or use drugs. She has a current medication list which includes the following prescription(s): empagliflozin, fluconazole, glimepiride, glucose blood, metformin, and mupirocin ointment. Current Outpatient Medications on File Prior to Visit  Medication Sig Dispense Refill  . empagliflozin (JARDIANCE) 10 MG TABS tablet Take 10 mg by mouth daily before breakfast. 90 tablet 0  . fluconazole (DIFLUCAN) 150 MG tablet TAKE 1 TABLET BY MOUTH EVERY DAY FOR 1 DOSE. MAY  REPEAT IN 3 DAYS AS NEEDED 2 tablet 0  . glimepiride (AMARYL) 4 MG tablet Take 1 tablet (4 mg total) by mouth 2 (two) times daily. 60 tablet 0  . glucose blood (ONETOUCH VERIO) test strip Check your blood sugar twice daily (Patient taking differently: Check your blood sugar twice daily) 100 each 12  . metFORMIN (GLUCOPHAGE-XR) 500 MG 24 hr tablet TAKE 1 TABLET BY MOUTH EVERY MORNING AND 2 TABLETS WITH DINNER 270 tablet 0   No current facility-administered medications on file prior to visit.    She is allergic to bactrim..  Review of Systems Review of Systems  Constitutional: Negative for activity change, appetite change and fatigue.  HENT:  Negative for hearing loss, congestion, tinnitus and ear discharge.  dentist q14m Eyes: Negative for visual disturbance (see optho q1y -- vision corrected to 20/20 with glasses).  Respiratory: Negative for cough, chest tightness and shortness of breath.   Cardiovascular: Negative for chest pain, palpitations and leg swelling.  Gastrointestinal: Negative for abdominal pain, diarrhea, constipation and abdominal distention.  Genitourinary: Negative for urgency, frequency, decreased urine volume and difficulty urinating.  Musculoskeletal: Negative for back pain, arthralgias and gait problem.  Skin: Negative for color change, pallor and rash.  Neurological: Negative for dizziness, light-headedness, numbness and headaches.  Hematological: Negative for adenopathy. Does not bruise/bleed easily.  Psychiatric/Behavioral: Negative for suicidal ideas, confusion, sleep disturbance, self-injury, dysphoric mood, decreased concentration and agitation.      Objective:    BP 138/86 (BP Location: Left Arm, Cuff Size: Large)   Pulse 86   Temp 98 F (36.7 C) (Oral)   Resp 16   Ht 5\' 5"  (1.651 m)   Wt 233 lb 6.4 oz (105.9 kg)   SpO2 97%   BMI 38.84 kg/m  General appearance: alert, cooperative, appears stated age and no distress Head: Normocephalic, without obvious abnormality, atraumatic Eyes: negative findings: lids and lashes normal and pupils equal, round, reactive to light and accomodation Ears: normal TM's and external ear canals both ears Nose: Nares normal. Septum midline. Mucosa normal. No drainage or sinus tenderness.-- + sore inside R nostril  Throat: lips, mucosa, and tongue normal; teeth and gums normal Neck: no adenopathy, no carotid bruit, no JVD, supple, symmetrical, trachea midline and thyroid not enlarged, symmetric, no tenderness/mass/nodules Back: symmetric, no curvature. ROM normal. No CVA tenderness. Lungs: clear to auscultation bilaterally Breasts: normal appearance, no masses or  tenderness Heart: regular rate and rhythm, S1, S2 normal, no murmur, click, rub or gallop Abdomen: soft, non-tender; bowel sounds normal; no masses,  no organomegaly Pelvic: deferred and not indicated; status post hysterectomy, negative ROS Extremities: extremities normal, atraumatic, no cyanosis or edema Pulses: 2+ and symmetric Skin: Skin color, texture, turgor normal. No rashes or lesions Lymph nodes: Cervical, supraclavicular, and axillary nodes normal. Neurologic: Alert and oriented X 3, normal strength and tone. Normal symmetric reflexes. Normal coordination and gait    Assessment:    Healthy female exam.      Plan:    ghm utd Check labs See After Visit Summary for Counseling Recommendations    1. Preventative health care See above - CBC with Differential/Platelet - Comprehensive metabolic panel - Lipid panel  2. Essential hypertension Well controlled, no changes to meds. Encouraged heart healthy diet such as the DASH diet and exercise as tolerated.  - CBC with Differential/Platelet - Comprehensive metabolic panel - Lipid panel  3. Estrogen deficiency  - DG Bone Density; Future  4. Urinary frequency  - POCT Urinalysis  Dipstick (Automated)  5. Sore in nose Recurrent-- will refer to ent - mupirocin ointment (BACTROBAN) 2 %; Place 1 application into the nose 2 (two) times daily.  Dispense: 22 g; Refill: 0 - Ambulatory referral to ENT  6. Type 2 diabetes mellitus with hyperglycemia, without long-term current use of insulin (HCC) Per endo   7. Hyperlipidemia, unspecified hyperlipidemia type Encouraged heart healthy diet, increase exercise, avoid trans fats, consider a krill oil cap daily  8. Class 2 obesity  CMA served as Education administrator during this visit. History, Physical and Plan performed by medical provider. Documentation and orders reviewed and attested to.  [name], DO

## 2018-01-26 NOTE — Assessment & Plan Note (Signed)
Encouraged heart healthy diet, increase exercise, avoid trans fats, consider a krill oil cap daily 

## 2018-01-26 NOTE — Assessment & Plan Note (Signed)
Pt working on diet

## 2018-01-28 ENCOUNTER — Other Ambulatory Visit: Payer: Self-pay

## 2018-01-28 LAB — URINE CULTURE
MICRO NUMBER:: 90406066
SPECIMEN QUALITY:: ADEQUATE

## 2018-01-28 MED ORDER — ROSUVASTATIN CALCIUM 10 MG PO TABS: 10.0000 mg | ORAL_TABLET | Freq: Every day | ORAL | 2 refills | Status: DC

## 2018-01-29 ENCOUNTER — Encounter: Payer: Self-pay | Admitting: *Deleted

## 2018-01-29 ENCOUNTER — Telehealth: Payer: Self-pay | Admitting: Family Medicine

## 2018-01-29 MED ORDER — CIPROFLOXACIN HCL 250 MG PO TABS: 250.0000 mg | ORAL_TABLET | Freq: Two times a day (BID) | ORAL | 0 refills | Status: DC

## 2018-01-29 NOTE — Telephone Encounter (Signed)
mychart sent.

## 2018-01-29 NOTE — Telephone Encounter (Signed)
rx sent in 

## 2018-01-29 NOTE — Telephone Encounter (Signed)
Cipro not noted on med list Results note as follows:Notes recorded by Carollee Herter, Alferd Apa, DO on 01/28/2018 at 11:26 AM EDT + uti--- cipro 250 mg bid x 3 days Routing back to provider

## 2018-01-29 NOTE — Telephone Encounter (Signed)
Copied from [place] [phone]. Topic: Quick Communication - Rx Refill/Question >> Jan 29, 2018  8:40 AM Robina Ade, Helene Kelp D wrote: Medication: ciprofloxacin Has the patient contacted their pharmacy? Yes, but they have not received it. Provider was suppose to send it in yesterday. (Agent: If no, request that the patient contact the pharmacy for the refill.) Preferred Pharmacy (with phone number or street name):[id number] - [place], [place] Agent: Please be advised that RX refills may take up to 3 business days. We ask that you follow-up with your pharmacy.

## 2018-02-04 ENCOUNTER — Telehealth: Payer: Self-pay | Admitting: Internal Medicine

## 2018-02-04 DIAGNOSIS — N182 Chronic kidney disease, stage 2 (mild): Principal | ICD-10-CM

## 2018-02-04 DIAGNOSIS — E0822 Diabetes mellitus due to underlying condition with diabetic chronic kidney disease: Secondary | ICD-10-CM

## 2018-02-04 NOTE — Telephone Encounter (Signed)
[id number] - [place], [address] AT [address]     empagliflozin (JARDIANCE) 10 MG TABS tablet     glimepiride (AMARYL) 4 MG tablet    metFORMIN (GLUCOPHAGE-XR) 500 MG 24 hr tablet   [place] and would like to see if doctor can send in an emergency supply for her. She only needs enough for two days  Please advise

## 2018-02-04 NOTE — Telephone Encounter (Signed)
error 

## 2018-02-05 ENCOUNTER — Telehealth: Payer: Self-pay | Admitting: Internal Medicine

## 2018-02-05 NOTE — Telephone Encounter (Signed)
Patient called yesterday about getting prescription sent over because she is out of town and needs them She has changed the pharmacy she would like it sent to,   glimepiride (AMARYL) 4 MG tablet    metFORMIN (GLUCOPHAGE-XR) 500 MG 24 hr tablet  [facility] [address] [place] [address]

## 2018-02-08 ENCOUNTER — Ambulatory Visit: Payer: 59 | Admitting: Internal Medicine

## 2018-02-08 ENCOUNTER — Encounter: Payer: Self-pay | Admitting: Internal Medicine

## 2018-02-08 VITALS — BP 126/72 | HR 89 | Ht 65.0 in | Wt 232.4 lb

## 2018-02-08 DIAGNOSIS — E669 Obesity, unspecified: Secondary | ICD-10-CM

## 2018-02-08 DIAGNOSIS — E785 Hyperlipidemia, unspecified: Secondary | ICD-10-CM

## 2018-02-08 DIAGNOSIS — E1165 Type 2 diabetes mellitus with hyperglycemia: Secondary | ICD-10-CM | POA: Diagnosis not present

## 2018-02-08 LAB — POCT GLYCOSYLATED HEMOGLOBIN (HGB A1C): Hemoglobin A1C: 7.3

## 2018-02-08 MED ORDER — EMPAGLIFLOZIN 25 MG PO TABS: 25.0000 mg | ORAL_TABLET | Freq: Every day | ORAL | 3 refills | Status: DC

## 2018-02-08 MED ORDER — GLIMEPIRIDE 4 MG PO TABS: ORAL_TABLET | ORAL | 3 refills | Status: DC

## 2018-02-08 NOTE — Progress Notes (Signed)
Patient ID: Angela Barr, female   DOB: [birth year], 57 y.o.   MRN: [id number]  HPI: Angela Barr is a 57 y.o.-year-old female, returning for f/u for DM2, dx in 2007, non-insulin-dependent, uncontrolled, without long term complications, but with hyperglycemia. Last visit 3 months ago.  She was out of town this weekend >> forgot meds at home.  She has been on ABx recently x 3 days.  Last hemoglobin A1c was: Lab Results  Component Value Date   HGBA1C 6.6 11/11/2017   HGBA1C 6.6 08/11/2017   HGBA1C 6.6 04/01/2017  09/25/2015: HbA1c 7.1%  She is now on: - Metformin XR 500 mg in a.m. + 1000 mg before dinner >> some loose stools, not every day - Amaryl 2-4 mg before dinner, depending on the size of the meal >> 4 mg before meals - Jardiance 10 mg before breakfast - added  since last visit She was on Invokana 300 mg daily in am >> increased urination; ran out 1 week ago, but too expensive to restart Stopped Trulicity 1.5 mg >> stopped b/c Nausea and HAs, and also expensive She was Bydureon >> lumps at inj. Site.  She was on Victoza, now too expensive.  Pt is checking sugars 0-1x a day: - am:   110-124, 136 x1 >> 150-158, 168 >> 133-181, 191 - 2h after b'fast: 79, 142, 203 >> 130, 151 >> n/c - before lunch: 136 >> n/c >> 115, 116 >> n/c >> 86, 98 - 2h after lunch:130s >> 103, 181 >> n/c >> 95-113, 176 - before dinner:  n/c >> 110 >> n/c >> 131 >> 86 - 2h after dinner: 140s >> n/c >> 79, 193 >> n/c - bedtime: n/c >> 200x1 >> n/c >> 156 - nighttime: n/c Lowest sugar was 79 >> 98 >> 131 >> 86; she has hypoglycemia awareness at 100. Highest sugar was 193 >> 136 >> 168 >> 191   Glucometer: One Touch Verio Flex   Pt's meals are: - Breakfast: bacon and sausage, scrambled eggs, wheat or rye toast - Lunch: salads, chicken, deli sandwich - Dinner: fish, chicken, eating out 5x/week: [place] , etc - Snacks: butter cookies, chips Stopped drinking sweet tea and sodas. Eats sunflower seeds  and nuts.  -No CKD, last BUN/creatinine:  Lab Results  Component Value Date   BUN 14 01/26/2018   CREATININE 0.55 01/26/2018   Lab Results  Component Value Date   MICRALBCREAT 0.7 02/05/2016   MICRALBCREAT 0.7 12/25/2014   MICRALBCREAT 2.0 06/13/2014   MICRALBCREAT 0.7 04/04/2013   MICRALBCREAT 0.5 01/30/2012   MICRALBCREAT 0.3 06/17/2011  We stopped lisinopril since she had low blood pressure and dizziness in the past.ACR's >> not high in last 5 years. 24 h urine for proteins >> normal. No h/o HTN.  -+ HL; last set of lipids: Lab Results  Component Value Date   CHOL 181 01/26/2018   HDL 54.60 01/26/2018   LDLCALC 113 (H) 01/26/2018   TRIG 66.0 01/26/2018   CHOLHDL 3 01/26/2018  On Zocor - last eye exam was in 12/2016: No DR -No numbness and tingling in her feet.  ROS: Constitutional: +weight gain/no weight loss, no fatigue, no subjective hyperthermia, no subjective hypothermia Eyes: no blurry vision, no xerophthalmia ENT: no sore throat, no nodules palpated in throat, no dysphagia, no odynophagia, no hoarseness Cardiovascular: no CP/no SOB/no palpitations/no leg swelling Respiratory: no cough/no SOB/no wheezing Gastrointestinal: no N/no V/no D/no C/no acid reflux Musculoskeletal: no muscle aches/no joint aches Skin: no rashes,+ hair loss Neurological:  no tremors/no numbness/no tingling/no dizziness, + HA  I reviewed pt's medications, allergies, PMH, social hx, family hx, and changes were documented in the history of present illness. Otherwise, unchanged from my initial visit note.   Past Medical History:  Diagnosis Date  . Arthritis    Hips   . Colon polyps 2008   [name]  . Diabetes mellitus 2006  . Hyperlipemia 2012  . Hypertension 2012   off meds at 01-08-18 [facility]   . Obesity    Past Surgical History:  Procedure Laterality Date  . BREAST BIOPSY  2003  . COLONOSCOPY    . POLYPECTOMY    . TUBAL LIGATION  1990  . VAGINAL HYSTERECTOMY  2006    History   Social History  . Marital Status: Married    Spouse Name: N/A  . Number of Children: 3   Occupational History  . RN    Social History Main Topics  . Smoking status: Never Smoker   . Smokeless tobacco: Never Used  . Alcohol Use: No  . Drug Use: No   Social History Narrative   3 caffeine drinks daily    Current Outpatient Medications on File Prior to Visit  Medication Sig Dispense Refill  . ciprofloxacin (CIPRO) 250 MG tablet Take 1 tablet (250 mg total) by mouth 2 (two) times daily. 6 tablet 0  . empagliflozin (JARDIANCE) 10 MG TABS tablet Take 10 mg by mouth daily before breakfast. 90 tablet 0  . fluconazole (DIFLUCAN) 150 MG tablet TAKE 1 TABLET BY MOUTH EVERY DAY FOR 1 DOSE. MAY REPEAT IN 3 DAYS AS NEEDED 2 tablet 0  . glimepiride (AMARYL) 4 MG tablet Take 1 tablet (4 mg total) by mouth 2 (two) times daily. 60 tablet 0  . glucose blood (ONETOUCH VERIO) test strip Check your blood sugar twice daily (Patient taking differently: Check your blood sugar twice daily) 100 each 12  . metFORMIN (GLUCOPHAGE-XR) 500 MG 24 hr tablet TAKE 1 TABLET BY MOUTH EVERY MORNING AND 2 TABLETS WITH DINNER 270 tablet 0  . mupirocin ointment (BACTROBAN) 2 % Place 1 application into the nose 2 (two) times daily. 22 g 0  . rosuvastatin (CRESTOR) 10 MG tablet Take 1 tablet (10 mg total) by mouth at bedtime. 30 tablet 2   No current facility-administered medications on file prior to visit.    Allergies  Allergen Reactions  . Bactrim Nausea Only   Family History  Problem Relation Age of Onset  . Heart failure Father   . Hypertension Father   . Colon cancer Father 62  . Diabetes Father   . Hypertension Mother   . Breast cancer Maternal Grandmother   . Diabetes Paternal Grandmother   . Colon polyps Neg Hx   . Esophageal cancer Neg Hx   . Rectal cancer Neg Hx   . Stomach cancer Neg Hx    PE: BP 126/72   Pulse 89   Ht 5\' 5"  (1.651 m)   Wt 232 lb 6.4 oz (105.4 kg)   SpO2 97%    BMI 38.67 kg/m  Body mass index is 38.67 kg/m. Wt Readings from Last 3 Encounters:  02/08/18 232 lb 6.4 oz (105.4 kg)  01/26/18 233 lb 6.4 oz (105.9 kg)  01/08/18 234 lb (106.1 kg)   Constitutional: overweight, in NAD Eyes: PERRLA, EOMI, no exophthalmos ENT: moist mucous membranes, no thyromegaly, no cervical lymphadenopathy Cardiovascular: RRR, No MRG Respiratory: CTA B Gastrointestinal: abdomen soft, NT, ND, BS+ Musculoskeletal: no deformities, strength intact  in all 4 Skin: moist, warm, no rashes Neurological: no tremor with outstretched hands, DTR normal in all 4  ASSESSMENT: 1. DM2, non-insulin-dependent, now better controlled, without long term complications, but with hyperglycemia  2. Obesity class 2 BMI Classification:  < 18.5 underweight   18.5-24.9 normal weight   25.0-29.9 overweight   30.0-34.9 class I obesity   35.0-39.9 class II obesity  ? 40.0 class III obesity   3. HL  PLAN:  1. Patient with long-standing, uncontrolled, type 2 diabetes, with improved control in the last year.  Unfortunately she had to come off Trulicity  and Invokana due to price, and now continues on metformin and Amaryl.  At last visit, we discussed about dietary changes, but continued the current regimen.  I advised her to use a lower dose of Amaryl before regular dinners, 2 mg, and 4 mg before larger dinners. - Since last visit, we added a low dose Jardiance >> but sugars still high in am despite the fact that she also increased Amaryl - I recommended to decrease am Amaryl, move all Metformin at night and increase Jardiance - I recommended:  Patient Instructions  Please move: - Metformin XR 1500 mg with dinner  Please decrease: - Amaryl  2 mg in am and 4 mg before dinner  Increase: - Jardiance to 25 mg daily in am  Please return in 3 months with your sugar log.   - today, HbA1c is 7.3% (higher) - continue checking sugars at different times of the day - check 1x a day,  rotating checks - advised for yearly eye exams >> she is UTD - Return to clinic in 3 mo with sugar log   2. Obesity class 2 -weight stable  -Unfortunately, she had to come off both Trulicity and Invokana due to price.  Both of these were helping her weight, also.  Jardiance should help, also. -At last visit, we discussed at length about improving her diet, suggested healthier food choices and references.  3. HL -Reviewed latest lipid panel from 01/2018: LDL above goal, but improved slightly.  Triglycerides and HDL cholesterol normal. -is on Zocor >> without side effects  Philemon Kingdom, MD PhD [facility]

## 2018-02-08 NOTE — Telephone Encounter (Signed)
Noted on previous call.  Closing encounter.

## 2018-02-08 NOTE — Telephone Encounter (Signed)
Pt scheduled for OV today.  

## 2018-02-08 NOTE — Patient Instructions (Addendum)
Please move: - Metformin XR 1500 mg with dinner  Please decrease: - Amaryl  2 mg in am and 4 mg before dinner  Increase: - Jardiance to 25 mg daily in am  Please return in 3 months with your sugar log.

## 2018-02-09 DIAGNOSIS — J3489 Other specified disorders of nose and nasal sinuses: Secondary | ICD-10-CM | POA: Diagnosis not present

## 2018-03-02 DIAGNOSIS — M79674 Pain in right toe(s): Secondary | ICD-10-CM | POA: Diagnosis not present

## 2018-03-02 DIAGNOSIS — Y33XXXA Other specified events, undetermined intent, initial encounter: Secondary | ICD-10-CM | POA: Diagnosis not present

## 2018-03-02 DIAGNOSIS — M25571 Pain in right ankle and joints of right foot: Secondary | ICD-10-CM | POA: Diagnosis not present

## 2018-03-02 DIAGNOSIS — S92354A Nondisplaced fracture of fifth metatarsal bone, right foot, initial encounter for closed fracture: Secondary | ICD-10-CM | POA: Diagnosis not present

## 2018-03-02 DIAGNOSIS — W010XXA Fall on same level from slipping, tripping and stumbling without subsequent striking against object, initial encounter: Secondary | ICD-10-CM | POA: Diagnosis not present

## 2018-03-02 DIAGNOSIS — M79671 Pain in right foot: Secondary | ICD-10-CM | POA: Diagnosis not present

## 2018-03-05 ENCOUNTER — Encounter (INDEPENDENT_AMBULATORY_CARE_PROVIDER_SITE_OTHER): Payer: Self-pay | Admitting: Orthopaedic Surgery

## 2018-03-05 ENCOUNTER — Ambulatory Visit (INDEPENDENT_AMBULATORY_CARE_PROVIDER_SITE_OTHER): Payer: 59

## 2018-03-05 ENCOUNTER — Ambulatory Visit (INDEPENDENT_AMBULATORY_CARE_PROVIDER_SITE_OTHER): Payer: 59 | Admitting: Orthopaedic Surgery

## 2018-03-05 DIAGNOSIS — M79671 Pain in right foot: Secondary | ICD-10-CM | POA: Diagnosis not present

## 2018-03-05 DIAGNOSIS — S92351A Displaced fracture of fifth metatarsal bone, right foot, initial encounter for closed fracture: Secondary | ICD-10-CM | POA: Diagnosis not present

## 2018-03-05 MED ORDER — VITAMIN D (ERGOCALCIFEROL) 1.25 MG (50000 UNIT) PO CAPS: 50000.0000 [IU] | ORAL_CAPSULE | ORAL | 0 refills | Status: AC

## 2018-03-05 NOTE — Progress Notes (Addendum)
Office Visit Note   Patient: Angela Barr           Date of Birth: [birth year]           MRN: [id number] Visit Date: 03/05/2018              Requested by: [address], [place], [place] [address] [place], [place] [address] PCP: Carollee Herter, Alferd Apa, DO   Assessment & Plan: Visit Diagnoses:  1. Closed displaced fracture of fifth metatarsal bone of right foot, initial encounter   2. Pain in right foot     Plan: Impression is right foot Jones fracture.  We will have the patient continue wearing her cam walker and may be weight bearing as tolerated.  I will also call in a prescription for vitamin D.  She will elevate for swelling.  A work note was provided today.  She will follow-up in 2 weeks time for repeat evaluation and x-ray call with concerns or questions in the meantime  Follow-Up Instructions: Return in about 2 weeks (around 03/19/2018).   Orders:  Orders Placed This Encounter  Procedures  . XR Foot Complete Right   Meds ordered this encounter  Medications  . Vitamin D, Ergocalciferol, (DRISDOL) 50000 units CAPS capsule    Sig: Take 1 capsule (50,000 Units total) by mouth every 7 (seven) days for 6 doses.    Dispense:  6 capsule    Refill:  0      Procedures: No procedures performed   Clinical Data: No additional findings.   Subjective: Chief Complaint  Patient presents with  . Right Foot - Pain    DOI 03/02/18 - [facility] - closed nondisplaced fracture of 5th metatarsal right foot    HPI patient is a pleasant 57 year old female who presents to our clinic today with a new injury to her right foot.  This past Tuesday, 03/02/2018 she was wearing high heels when she inverted her right ankle.  She was seen at [place] her x-rays were obtained.  This showed a 5th metatarsal base fracture.  She was placed in a cam walker weightbearing as tolerated.  She comes in today for further evaluation treatment recommendation.  She does have moderate pain to  the lateral foot only with pressure.  No numbness, tingling or burning.  Of note, she is a diabetic.  She is a non-smoker.  Review of Systems as detailed in HPI.  All others reviewed and are negative.   Objective: Vital Signs: There were no vitals taken for this visit.  Physical Exam well-developed and well-nourished female in no acute distress.  Alert and oriented x3.  Ortho Exam examination of the right foot reveals minimal swelling.  Marked tenderness over the base of the fifth metatarsal.  Motor and sensory function intact distally.  Specialty Comments:  No specialty comments available.  Imaging: Xr Foot Complete Right  Result Date: 03/05/2018 Fracture to the base of the fifth metatarsal    PMFS History: Patient Active Problem List   Diagnosis Date Noted  . Displaced fracture of fifth metatarsal bone of right foot 03/05/2018  . Nasal sore 02/24/2017  . Type 2 diabetes mellitus with hyperglycemia, without long-term current use of insulin ([place]) 03/20/2016  . Norovirus 11/04/2015  . Cellulitis and abscess of trunk 05/10/2015  . Vaginitis and vulvovaginitis 03/19/2015  . Serous otitis media 06/30/2013  . Class 2 obesity 06/25/2013  . Hyperlipidemia 06/25/2013  . Hemorrhage of rectum and anus 07/04/2011  .  Personal history of colonic polyps 07/04/2011  . Family history of malignant neoplasm of gastrointestinal tract 07/04/2011  . HTN (hypertension) 06/17/2011   Past Medical History:  Diagnosis Date  . Arthritis    Hips   . Colon polyps 2008   [name]  . Diabetes mellitus 2006  . Hyperlipemia 2012  . Hypertension 2012   off meds at 01-08-18 [facility]   . Obesity     Family History  Problem Relation Age of Onset  . Heart failure Father   . Hypertension Father   . Colon cancer Father 89  . Diabetes Father   . Hypertension Mother   . Breast cancer Maternal Grandmother   . Diabetes Paternal Grandmother   . Colon polyps Neg Hx   . Esophageal cancer Neg Hx     . Rectal cancer Neg Hx   . Stomach cancer Neg Hx     Past Surgical History:  Procedure Laterality Date  . BREAST BIOPSY  2003  . COLONOSCOPY    . POLYPECTOMY    . TUBAL LIGATION  1990  . VAGINAL HYSTERECTOMY  2006   Social History   Occupational History  . Occupation: Therapist, sports  Tobacco Use  . Smoking status: Never Smoker  . Smokeless tobacco: Never Used  Substance and Sexual Activity  . Alcohol use: No  . Drug use: No  . Sexual activity: Not on file

## 2018-03-08 ENCOUNTER — Encounter (INDEPENDENT_AMBULATORY_CARE_PROVIDER_SITE_OTHER): Payer: Self-pay | Admitting: Radiology

## 2018-03-10 ENCOUNTER — Other Ambulatory Visit: Payer: Self-pay | Admitting: Internal Medicine

## 2018-03-10 ENCOUNTER — Encounter (INDEPENDENT_AMBULATORY_CARE_PROVIDER_SITE_OTHER): Payer: Self-pay | Admitting: Orthopaedic Surgery

## 2018-03-10 DIAGNOSIS — N182 Chronic kidney disease, stage 2 (mild): Principal | ICD-10-CM

## 2018-03-10 DIAGNOSIS — E0822 Diabetes mellitus due to underlying condition with diabetic chronic kidney disease: Secondary | ICD-10-CM

## 2018-03-15 DIAGNOSIS — S92901S Unspecified fracture of right foot, sequela: Secondary | ICD-10-CM | POA: Diagnosis not present

## 2018-03-15 DIAGNOSIS — E119 Type 2 diabetes mellitus without complications: Secondary | ICD-10-CM | POA: Diagnosis not present

## 2018-03-19 ENCOUNTER — Ambulatory Visit (INDEPENDENT_AMBULATORY_CARE_PROVIDER_SITE_OTHER): Payer: 59

## 2018-03-19 ENCOUNTER — Ambulatory Visit (INDEPENDENT_AMBULATORY_CARE_PROVIDER_SITE_OTHER): Payer: 59 | Admitting: Orthopaedic Surgery

## 2018-03-19 ENCOUNTER — Encounter (INDEPENDENT_AMBULATORY_CARE_PROVIDER_SITE_OTHER): Payer: Self-pay

## 2018-03-19 ENCOUNTER — Telehealth (INDEPENDENT_AMBULATORY_CARE_PROVIDER_SITE_OTHER): Payer: Self-pay | Admitting: Orthopaedic Surgery

## 2018-03-19 ENCOUNTER — Encounter (INDEPENDENT_AMBULATORY_CARE_PROVIDER_SITE_OTHER): Payer: Self-pay | Admitting: Orthopaedic Surgery

## 2018-03-19 DIAGNOSIS — S92351D Displaced fracture of fifth metatarsal bone, right foot, subsequent encounter for fracture with routine healing: Secondary | ICD-10-CM

## 2018-03-19 MED ORDER — ZINC SULFATE 220 (50 ZN) MG PO CAPS
220.0000 mg | ORAL_CAPSULE | Freq: Every day | ORAL | 0 refills | Status: DC
Start: 2018-03-19 — End: 2018-05-14

## 2018-03-19 MED ORDER — CALCIUM CARBONATE-VITAMIN D 500-200 MG-UNIT PO TABS: 1.0000 | ORAL_TABLET | Freq: Three times a day (TID) | ORAL | 12 refills | Status: DC

## 2018-03-19 MED ORDER — ZINC SULFATE 220 (50 ZN) MG PO CAPS: 220.0000 mg | ORAL_CAPSULE | Freq: Every day | ORAL | 0 refills | Status: DC

## 2018-03-19 NOTE — Telephone Encounter (Signed)
Patient aware.

## 2018-03-19 NOTE — Telephone Encounter (Signed)
Please advise 

## 2018-03-19 NOTE — Telephone Encounter (Signed)
No prolonged standing or walking for 6 weeks

## 2018-03-19 NOTE — Addendum Note (Signed)
Addended by: Azucena Cecil on: 03/19/2018 09:53 AM   Modules accepted: Orders

## 2018-03-19 NOTE — Telephone Encounter (Signed)
Patient called needing a note stating any and all restrictions for her employer. Patient will have spouse or daughter pick up note. The number to contact patient is [phone]

## 2018-03-19 NOTE — Progress Notes (Signed)
Post-Op Visit Note   Patient: Angela Barr           Date of Birth: Dec 23, 1960           MRN: [id number] Visit Date: 03/19/2018 PCP: [name], DO   Assessment & Plan:  Chief Complaint:  Chief Complaint  Patient presents with  . Right Foot - Follow-up   Visit Diagnoses:  1. Closed displaced fracture of fifth metatarsal bone of right foot with routine healing, subsequent encounter     Plan: Patient is just over 2 weeks status post nondisplaced base of fifth metatarsal fracture.  Overall she is doing much better.  She has been in a Cam walker.  X-rays show stable alignment.  At this point going to convert her to a postop shoe.  Weight-bear as tolerated.  May discontinue aspirin.  Follow-up in 4 weeks with three-view x-rays of right foot.  Follow-Up Instructions: Return in about 1 month (around 04/16/2018).   Orders:  Orders Placed This Encounter  Procedures  . XR Foot Complete Right   Meds ordered this encounter  Medications  . calcium-vitamin D (OSCAL WITH D) 500-200 MG-UNIT tablet    Sig: Take 1 tablet by mouth 3 (three) times daily.    Dispense:  90 tablet    Refill:  12  . zinc sulfate 220 (50 Zn) MG capsule    Sig: Take 1 capsule (220 mg total) by mouth daily.    Dispense:  42 capsule    Refill:  0    Imaging: Xr Foot Complete Right  Result Date: 03/19/2018 Stable alignment of fifth metatarsal fracture.  No complications.   PMFS History: Patient Active Problem List   Diagnosis Date Noted  . Displaced fracture of fifth metatarsal bone of right foot 03/05/2018  . Nasal sore 02/24/2017  . Type 2 diabetes mellitus with hyperglycemia, without long-term current use of insulin ([place]) 03/20/2016  . Norovirus 11/04/2015  . Cellulitis and abscess of trunk 05/10/2015  . Vaginitis and vulvovaginitis 03/19/2015  . Serous otitis media 06/30/2013  . Class 2 obesity 06/25/2013  . Hyperlipidemia 06/25/2013  . Hemorrhage of rectum and anus 07/04/2011  .  Personal history of colonic polyps 07/04/2011  . Family history of malignant neoplasm of gastrointestinal tract 07/04/2011  . HTN (hypertension) 06/17/2011   Past Medical History:  Diagnosis Date  . Arthritis    Hips   . Colon polyps 2008   [name]  . Diabetes mellitus 2006  . Hyperlipemia 2012  . Hypertension 2012   off meds at 01-08-18 [facility]   . Obesity     Family History  Problem Relation Age of Onset  . Heart failure Father   . Hypertension Father   . Colon cancer Father 56  . Diabetes Father   . Hypertension Mother   . Breast cancer Maternal Grandmother   . Diabetes Paternal Grandmother   . Colon polyps Neg Hx   . Esophageal cancer Neg Hx   . Rectal cancer Neg Hx   . Stomach cancer Neg Hx     Past Surgical History:  Procedure Laterality Date  . BREAST BIOPSY  2003  . COLONOSCOPY    . POLYPECTOMY    . TUBAL LIGATION  1990  . VAGINAL HYSTERECTOMY  2006   Social History   Occupational History  . Occupation: Therapist, sports  Tobacco Use  . Smoking status: Never Smoker  . Smokeless tobacco: Never Used  Substance and Sexual Activity  . Alcohol use: No  .  Drug use: No  . Sexual activity: Not on file

## 2018-03-19 NOTE — Telephone Encounter (Signed)
Note made.  

## 2018-03-25 ENCOUNTER — Encounter: Payer: Self-pay | Admitting: Internal Medicine

## 2018-03-25 MED ORDER — GLUCOSE BLOOD VI STRP: ORAL_STRIP | 12 refills | Status: DC

## 2018-03-28 ENCOUNTER — Other Ambulatory Visit: Payer: Self-pay | Admitting: Internal Medicine

## 2018-03-28 DIAGNOSIS — N760 Acute vaginitis: Secondary | ICD-10-CM

## 2018-03-29 ENCOUNTER — Telehealth: Payer: Self-pay | Admitting: Family Medicine

## 2018-03-29 ENCOUNTER — Other Ambulatory Visit: Payer: Self-pay

## 2018-03-29 MED ORDER — GLIMEPIRIDE 4 MG PO TABS: ORAL_TABLET | ORAL | 3 refills | Status: DC

## 2018-03-29 NOTE — Telephone Encounter (Signed)
OK 

## 2018-03-29 NOTE — Telephone Encounter (Signed)
Is this okay to refill? 

## 2018-03-29 NOTE — Telephone Encounter (Signed)
Patient is requesting.

## 2018-04-15 ENCOUNTER — Ambulatory Visit (INDEPENDENT_AMBULATORY_CARE_PROVIDER_SITE_OTHER): Payer: No Typology Code available for payment source

## 2018-04-15 ENCOUNTER — Ambulatory Visit (INDEPENDENT_AMBULATORY_CARE_PROVIDER_SITE_OTHER): Payer: No Typology Code available for payment source | Admitting: Orthopaedic Surgery

## 2018-04-15 DIAGNOSIS — S92351D Displaced fracture of fifth metatarsal bone, right foot, subsequent encounter for fracture with routine healing: Secondary | ICD-10-CM

## 2018-04-15 DIAGNOSIS — M79671 Pain in right foot: Secondary | ICD-10-CM | POA: Diagnosis not present

## 2018-04-15 NOTE — Progress Notes (Signed)
   Post-Op Visit Note   Patient: Angela Barr           Date of Birth: 07/08/1961           MRN: [id number] Visit Date: 04/15/2018 PCP: [name], DO   Assessment & Plan:  Chief Complaint:  Chief Complaint  Patient presents with  . Right Foot - Follow-up    5th metatarsal fracture   Visit Diagnoses:  1. Closed displaced fracture of fifth metatarsal bone of right foot with routine healing, subsequent encounter   2. Pain in right foot     Plan: Patient is 6 weeks status post nondisplaced fifth metatarsal fracture.  She is overall feeling better.  She is hopeful to discontinue the postop shoe.  Her physical exam shows mild tenderness at the base of fifth metatarsal.  No significant swelling.  She is ambulating well.  From my standpoint we will discontinue the postop shoe and wean into a regular shoe per her comfort level.  I would like to recheck her in 6 weeks with three-view x-rays of the right foot.  Follow-Up Instructions: Return in about 6 weeks (around 05/27/2018).   Orders:  Orders Placed This Encounter  Procedures  . XR Foot Complete Right   No orders of the defined types were placed in this encounter.   Imaging: Xr Foot Complete Right  Result Date: 04/15/2018 Evidence of bony consolidation and progressive healing.   PMFS History: Patient Active Problem List   Diagnosis Date Noted  . Displaced fracture of fifth metatarsal bone of right foot 03/05/2018  . Nasal sore 02/24/2017  . Type 2 diabetes mellitus with hyperglycemia, without long-term current use of insulin ([place]) 03/20/2016  . Norovirus 11/04/2015  . Cellulitis and abscess of trunk 05/10/2015  . Vaginitis and vulvovaginitis 03/19/2015  . Serous otitis media 06/30/2013  . Class 2 obesity 06/25/2013  . Hyperlipidemia 06/25/2013  . Hemorrhage of rectum and anus 07/04/2011  . Personal history of colonic polyps 07/04/2011  . Family history of malignant neoplasm of gastrointestinal tract  07/04/2011  . HTN (hypertension) 06/17/2011   Past Medical History:  Diagnosis Date  . Arthritis    Hips   . Colon polyps 2008   [name]  . Diabetes mellitus 2006  . Hyperlipemia 2012  . Hypertension 2012   off meds at 01-08-18 [facility]   . Obesity     Family History  Problem Relation Age of Onset  . Heart failure Father   . Hypertension Father   . Colon cancer Father 53  . Diabetes Father   . Hypertension Mother   . Breast cancer Maternal Grandmother   . Diabetes Paternal Grandmother   . Colon polyps Neg Hx   . Esophageal cancer Neg Hx   . Rectal cancer Neg Hx   . Stomach cancer Neg Hx     Past Surgical History:  Procedure Laterality Date  . BREAST BIOPSY  2003  . COLONOSCOPY    . POLYPECTOMY    . TUBAL LIGATION  1990  . VAGINAL HYSTERECTOMY  2006   Social History   Occupational History  . Occupation: Therapist, sports  Tobacco Use  . Smoking status: Never Smoker  . Smokeless tobacco: Never Used  Substance and Sexual Activity  . Alcohol use: No  . Drug use: No  . Sexual activity: Not on file

## 2018-05-02 ENCOUNTER — Other Ambulatory Visit: Payer: Self-pay | Admitting: Internal Medicine

## 2018-05-06 ENCOUNTER — Other Ambulatory Visit: Payer: Self-pay | Admitting: Internal Medicine

## 2018-05-06 DIAGNOSIS — E1151 Type 2 diabetes mellitus with diabetic peripheral angiopathy without gangrene: Secondary | ICD-10-CM

## 2018-05-06 DIAGNOSIS — IMO0002 Reserved for concepts with insufficient information to code with codable children: Secondary | ICD-10-CM

## 2018-05-06 DIAGNOSIS — E1165 Type 2 diabetes mellitus with hyperglycemia: Principal | ICD-10-CM

## 2018-05-14 ENCOUNTER — Ambulatory Visit: Payer: 59 | Admitting: Internal Medicine

## 2018-05-14 ENCOUNTER — Encounter: Payer: Self-pay | Admitting: Internal Medicine

## 2018-05-14 VITALS — BP 128/80 | HR 90 | Ht 65.0 in | Wt 226.0 lb

## 2018-05-14 DIAGNOSIS — E785 Hyperlipidemia, unspecified: Secondary | ICD-10-CM | POA: Diagnosis not present

## 2018-05-14 DIAGNOSIS — E669 Obesity, unspecified: Secondary | ICD-10-CM

## 2018-05-14 DIAGNOSIS — E1165 Type 2 diabetes mellitus with hyperglycemia: Secondary | ICD-10-CM

## 2018-05-14 LAB — BASIC METABOLIC PANEL WITH GFR
BUN: 12 mg/dL (ref 7–25)
CHLORIDE: 106 mmol/L (ref 98–110)
CO2: 24 mmol/L (ref 20–32)
Calcium: 9.6 mg/dL (ref 8.6–10.4)
Creat: 0.58 mg/dL (ref 0.50–1.05)
GFR, EST AFRICAN AMERICAN: 120 mL/min/{1.73_m2} (ref >=60)
GFR, Est Non African American: 104 mL/min/{1.73_m2} (ref >=60)
GLUCOSE: 106 mg/dL — AB (ref 65–99)
POTASSIUM: 4.2 mmol/L (ref 3.5–5.3)
Sodium: 139 mmol/L (ref 135–146)

## 2018-05-14 LAB — MICROALBUMIN / CREATININE URINE RATIO
Creatinine,U: 136.3 mg/dL
MICROALB UR: 0.8 mg/dL (ref 0.0–1.9)
MICROALB/CREAT RATIO: 0.6 mg/g (ref 0.0–30.0)

## 2018-05-14 LAB — POCT GLYCOSYLATED HEMOGLOBIN (HGB A1C): Hemoglobin A1C: 6.4 % — AB (ref 4.0–5.6)

## 2018-05-14 MED ORDER — GLIMEPIRIDE 2 MG PO TABS: ORAL_TABLET | ORAL | 1 refills | Status: DC

## 2018-05-14 NOTE — Progress Notes (Signed)
Patient ID: Angela Barr, female   DOB: [birth year], 57 y.o.   MRN: [id number]  HPI: Angela Barr is a 57 y.o.-year-old female, returning for f/u for DM2, dx in 2007, non-insulin-dependent, uncontrolled, without long term complications, but with hyperglycemia. Last visit 3 months ago.  Since last visit, her insurance started to cover Trulicity and Invokana again, so she restarted them and stopped Jardiance.  She also started to improve her diet >> low carb. Sugars better. Lost weight.  Last hemoglobin A1c was: Lab Results  Component Value Date   HGBA1C 7.3 02/08/2018   HGBA1C 6.6 11/11/2017   HGBA1C 6.6 08/11/2017  09/25/2015: HbA1c 7.1%  She is now on: - Metformin XR 500 mg in a.m. + 1000 mg before dinner >> 1500 mg with dinner - some loose stool - Amaryl 4 mg in am and 4 mg before dinner - Jardiance 10 >> 25 mg before breakfast >> Invokana 300 mg daily in am - Trulicity 1.5 mg weekly She was on Invokana 300 mg daily in am >> not covered Stopped Trulicity 1.5 mg >> stopped b/c Nausea and HAs, and also expensive She was Bydureon >> lumps at inj. Site.  She was on Victoza, now too expensive.  Pt is checking sugars 1 X a day: - am:   150-158, 168 >> 133-181, 191 >> 79, 88-138, 146 - 2h after b'fast: 79, 142, 203 >> 130, 151 >> n/c - before lunch: n/c >> 115, 116 >> n/c >> 86, 98 >> 104-112 - 2h after lunch:1 103, 181 >> n/c >> 95-113, 176 >> 88 - before dinner:  n/c >> 110 >> n/c >> 131 >> 86 >> 109 - 2h after dinner: 140s >> n/c >> 79, 193 >> n/c - bedtime: n/c >> 200x1 >> n/c >> 156 >> 102 - nighttime: n/c >> 70s occas. Lowest sugar was  86 >> 70s; she has hypoglycemia awareness at 90s. Highest sugar was 191 >> 146 x1  Glucometer: One Touch Verio Flex   Not drinking sweet tea and sodas anymore. Eats sunflower seeds and nuts.  -No CKD, last BUN/creatinine:  Lab Results  Component Value Date   BUN 14 01/26/2018   CREATININE 0.55 01/26/2018   Lab Results  Component  Value Date   MICRALBCREAT 0.7 02/05/2016   MICRALBCREAT 0.7 12/25/2014   MICRALBCREAT 2.0 06/13/2014   MICRALBCREAT 0.7 04/04/2013   MICRALBCREAT 0.5 01/30/2012   MICRALBCREAT 0.3 06/17/2011  We stopped lisinopril since she had low blood pressure and dizziness in the past. ACR's >> not high in last 5 years. 24 h urine for proteins >> normal. She has no history of hypertension.  - + HL; last set of lipids: Lab Results  Component Value Date   CHOL 181 01/26/2018   HDL 54.60 01/26/2018   LDLCALC 113 (H) 01/26/2018   TRIG 66.0 01/26/2018   CHOLHDL 3 01/26/2018  On Zocor - last eye exam was in 2019: No DR - No numbness and tingling in her feet.  ROS: Constitutional: + weight loss, no fatigue, no subjective hyperthermia, no subjective hypothermia Eyes: no blurry vision, no xerophthalmia ENT: no sore throat, no nodules palpated in throat, no dysphagia, no odynophagia, no hoarseness Cardiovascular: no CP/no SOB/no palpitations/no leg swelling Respiratory: no cough/no SOB/no wheezing Gastrointestinal: no N/no V/no D/no C/no acid reflux Musculoskeletal: no muscle aches/no joint aches Skin: no rashes, no hair loss Neurological: no tremors/no numbness/no tingling/no dizziness  I reviewed pt's medications, allergies, PMH, social hx, family hx, and changes were documented in  the history of present illness. Otherwise, unchanged from my initial visit note.  Past Medical History:  Diagnosis Date  . Arthritis    Hips   . Colon polyps 2008   [name]  . Diabetes mellitus 2006  . Hyperlipemia 2012  . Hypertension 2012   off meds at 01-09-55 PV   . Obesity    Past Surgical History:  Procedure Laterality Date  . BREAST BIOPSY  2003  . COLONOSCOPY    . POLYPECTOMY    . TUBAL LIGATION  1990  . VAGINAL HYSTERECTOMY  2006   History   Social History  . Marital Status: Married    Spouse Name: N/A  . Number of Children: 3   Occupational History  . RN    Social History  Main Topics  . Smoking status: Never Smoker   . Smokeless tobacco: Never Used  . Alcohol Use: No  . Drug Use: No   Social History Narrative   3 caffeine drinks daily    Current Outpatient Medications on File Prior to Visit  Medication Sig Dispense Refill  . calcium-vitamin D (OSCAL WITH D) 500-200 MG-UNIT tablet Take 1 tablet by mouth 3 (three) times daily. 90 tablet 12  . calcium-vitamin D (OSCAL WITH D) 500-200 MG-UNIT tablet Take 1 tablet by mouth 3 (three) times daily. 90 tablet 12  . ciprofloxacin (CIPRO) 250 MG tablet Take 1 tablet (250 mg total) by mouth 2 (two) times daily. 6 tablet 0  . empagliflozin (JARDIANCE) 25 MG TABS tablet Take 25 mg by mouth daily. 90 tablet 3  . fluconazole (DIFLUCAN) 150 MG tablet TAKE 1 TABLET BY MOUTH EVERY DAY FOR 1 DOSE. MAY REPEAT IN 3 DAYS AS NEEDED 2 tablet 0  . glimepiride (AMARYL) 4 MG tablet Use 2 mg in a,m and 4 mg in pm 60 tablet 3  . glimepiride (AMARYL) 4 MG tablet TAKE 1 TABLET(4 MG) BY MOUTH TWICE DAILY 60 tablet 0  . glucose blood (ONETOUCH VERIO) test strip Check your blood sugar twice daily 100 each 12  . INVOKANA 300 MG TABS tablet   1  . INVOKANA 300 MG TABS tablet TAKE 1 TABLET(300 MG) BY MOUTH DAILY BEFORE BREAKFAST 30 tablet 0  . metFORMIN (GLUCOPHAGE-XR) 500 MG 24 hr tablet TAKE 1 TABLET BY MOUTH EVERY MORNING AND 2 TABLETS WITH DINNER 270 tablet 0  . mupirocin ointment (BACTROBAN) 2 % Place 1 application into the nose 2 (two) times daily. 22 g 0  . rosuvastatin (CRESTOR) 10 MG tablet Take 1 tablet (10 mg total) by mouth at bedtime. 30 tablet 2  . TRULICITY 1.5 NI/6.2VO SOPN INJECT 1.5 MG ONCE WEEKLY  0  . TRULICITY 1.5 JJ/0.0XF SOPN INJECT 1.5 MG ONCE WEEKLY 2 mL 0  . zinc sulfate 220 (50 Zn) MG capsule Take 1 capsule (220 mg total) by mouth daily. 42 capsule 0  . zinc sulfate 220 (50 Zn) MG capsule Take 1 capsule (220 mg total) by mouth daily. 42 capsule 0   No current facility-administered medications on file prior to visit.     Allergies  Allergen Reactions  . Bactrim Nausea Only  . Sulfa Antibiotics Nausea And Vomiting  . Sulfamethoxazole-Trimethoprim    Family History  Problem Relation Age of Onset  . Heart failure Father   . Hypertension Father   . Colon cancer Father 44  . Diabetes Father   . Hypertension Mother   . Breast cancer Maternal Grandmother   . Diabetes Paternal Grandmother   .  Colon polyps Neg Hx   . Esophageal cancer Neg Hx   . Rectal cancer Neg Hx   . Stomach cancer Neg Hx    PE: BP 128/80   Pulse 90   Ht 5\' 5"  (1.651 m)   Wt 226 lb (102.5 kg)   SpO2 95%   BMI 37.61 kg/m  Body mass index is 37.61 kg/m. Wt Readings from Last 3 Encounters:  05/14/18 226 lb (102.5 kg)  02/08/18 232 lb 6.4 oz (105.4 kg)  01/26/18 233 lb 6.4 oz (105.9 kg)   Constitutional: overweight, in NAD Eyes: PERRLA, EOMI, no exophthalmos ENT: moist mucous membranes, no thyromegaly, no cervical lymphadenopathy Cardiovascular: RRR, No MRG Respiratory: CTA B Gastrointestinal: abdomen soft, NT, ND, BS+ Musculoskeletal: no deformities, strength intact in all 4 Skin: moist, warm, no rashes Neurological: no tremor with outstretched hands, DTR normal in all 4  ASSESSMENT: 1. DM2, non-insulin-dependent, now better controlled, without long term complications, but with hyperglycemia  2. Obesity class 2 BMI Classification:  < 18.5 underweight   18.5-24.9 normal weight   25.0-29.9 overweight   30.0-34.9 class I obesity   35.0-39.9 class II obesity  ? 40.0 class III obesity   3. HL  PLAN:  1. Patient with long-standing, uncontrolled, type 2 diabetes, with still high blood sugars in a.m. at last visit, despite increasing Amaryl and adding a low-dose Jardiance.  At last visit, we decreased her a.m. dose of Amaryl, increased Invokana and moved the entire metformin dose at night.  We discussed about dietary changes, also.  She could not afford a a GLP-1 receptor agonist or Invokana, but she restarted  these since last visit as they started being covered again by her insurance.  She stopped Jardiance after she started back on Invokana.  Her sugars improved and she lost 6 pounds since last visit - At this visit, sugars are almost all at goal, with few exceptions.  However, she feels that her sugars are low in the middle of the day and she also had one low blood sugar in the morning, in the 70s.  Therefore, we will reduce the dose of glimepiride with both breakfast and dinner.  At next visit, I hope we can stop glimepiride completely - I recommended:  Patient Instructions  Please continue: - Metformin XR 1500 mg with dinner or 500 mg with lunch + 1000 mg with dinner - Invokana 300 mg daily in am - Trulicity 1.5 mg weekly  Please decrease: - Amaryl 2 mg in am and 2 mg before dinner  Please return in 4 months with your sugar log.   - today, HbA1c is 6.4% (better) - continue checking sugars at different times of the day - check 1x a day, rotating checks - advised for yearly eye exams >> she is  UTD - We will check a BMP since she is switched to a higher dose of Invokana  and will also add an ACR - Return to clinic in 4 mo with sugar log   2. Obesity class 2 - she restarted Invokana and Trulicity, also, she changed her diet since last visit >> lost 6 lbs.  3. HL - Reviewed latest lipid panel from 01/2018; LDL higher than target Lab Results  Component Value Date   CHOL 181 01/26/2018   HDL 54.60 01/26/2018   LDLCALC 113 (H) 01/26/2018   TRIG 66.0 01/26/2018   CHOLHDL 3 01/26/2018  - Continues the Zocor without side effects.  Office Visit on 05/14/2018  Component Date Value  Ref Range Status  . Glucose, Bld 05/14/2018 106* 65 - 99 mg/dL Final   Comment: .            Fasting reference interval . For someone without known diabetes, a glucose value between 100 and 125 mg/dL is consistent with prediabetes and should be confirmed with a follow-up test. .   . BUN 05/14/2018 12  7 -  25 mg/dL Final  . Creat 05/14/2018 0.58  0.50 - 1.05 mg/dL Final   Comment: For patients >27 years of age, the reference limit for Creatinine is approximately 13% higher for people identified as African-American. .   . GFR, Est Non African American 05/14/2018 104  > OR = 60 mL/min/1.65m2 Final  . GFR, Est African American 05/14/2018 120  > OR = 60 mL/min/1.71m2 Final  . BUN/Creatinine Ratio 67/89/3810 NOT APPLICABLE  6 - 22 (calc) Final  . Sodium 05/14/2018 139  135 - 146 mmol/L Final  . Potassium 05/14/2018 4.2  3.5 - 5.3 mmol/L Final  . Chloride 05/14/2018 106  98 - 110 mmol/L Final  . CO2 05/14/2018 24  20 - 32 mmol/L Final  . Calcium 05/14/2018 9.6  8.6 - 10.4 mg/dL Final  . Microalb, Ur 05/14/2018 0.8  0.0 - 1.9 mg/dL Final  . Creatinine,U 05/14/2018 136.3  mg/dL Final  . Microalb Creat Ratio 05/14/2018 0.6  0.0 - 30.0 mg/g Final  . Hemoglobin A1C 05/14/2018 6.4* 4.0 - 5.6 % Final     Philemon Kingdom, MD PhD [facility]

## 2018-05-14 NOTE — Patient Instructions (Addendum)
Please continue: - Metformin XR 1500 mg with dinner or 500 mg with lunch + 1000 mg with dinner - Invokana 300 mg daily in am - Trulicity 1.5 mg weekly  Please decrease: - Amaryl 2 mg in am and 2 mg before dinner  Please return in 4 months with your sugar log.

## 2018-05-25 ENCOUNTER — Ambulatory Visit (INDEPENDENT_AMBULATORY_CARE_PROVIDER_SITE_OTHER): Payer: No Typology Code available for payment source

## 2018-05-25 ENCOUNTER — Ambulatory Visit (INDEPENDENT_AMBULATORY_CARE_PROVIDER_SITE_OTHER): Payer: No Typology Code available for payment source | Admitting: Orthopaedic Surgery

## 2018-05-25 DIAGNOSIS — S92351D Displaced fracture of fifth metatarsal bone, right foot, subsequent encounter for fracture with routine healing: Secondary | ICD-10-CM | POA: Diagnosis not present

## 2018-05-25 NOTE — Progress Notes (Signed)
Office Visit Note   Patient: Angela Barr           Date of Birth: 12/21/1960           MRN: [id number] Visit Date: 05/25/2018              Requested by: [address], [place], [place] [address] [place], [place] [address] PCP: Carollee Herter, Alferd Apa, DO   Assessment & Plan: Visit Diagnoses:  1. Closed displaced fracture of fifth metatarsal bone of right foot with routine healing, subsequent encounter     Plan: Patient is now almost 3 months status post non-displaced fifth metatarsal fracture.  Overall she is improving both clinically and radiographically.  I think she can continue to increase her activity as tolerated.  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  Questions encouraged and answered.  From my standpoint I think is fine to follow-up as needed.  Follow-Up Instructions: Return if symptoms worsen or fail to improve.   Orders:  Orders Placed This Encounter  Procedures  . XR Foot Complete Right   No orders of the defined types were placed in this encounter.     Procedures: No procedures performed   Clinical Data: No additional findings.   Subjective: Chief Complaint  Patient presents with  . Right Foot - Follow-up    Patient is a 57 year old follows up for her fifth metatarsal fracture.  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  She has been doing her regular duty.  Her foot is feeling better with occasional aching.  She wears sneakers and closed toe shoes usually.  She is able to wear flip-flops today.   Review of Systems   Objective: Vital Signs: There were no vitals taken for this visit.  Physical Exam  Ortho Exam Right foot exam shows no swelling.  She has very mild discomfort with palpation of her fifth metatarsal base. Specialty Comments:  No specialty comments available.  Imaging: Xr Foot Complete Right  Result Date: 05/25/2018 X-rays demonstrate continued healing and bony consolidation and fracture  healing.  No complications.    PMFS History: Patient Active Problem List   Diagnosis Date Noted  . Displaced fracture of fifth metatarsal bone of right foot 03/05/2018  . Nasal sore 02/24/2017  . Type 2 diabetes mellitus with hyperglycemia, without long-term current use of insulin ([place]) 03/20/2016  . Norovirus 11/04/2015  . Cellulitis and abscess of trunk 05/10/2015  . Vaginitis and vulvovaginitis 03/19/2015  . Serous otitis media 06/30/2013  . Class 2 obesity 06/25/2013  . Hyperlipidemia 06/25/2013  . Hemorrhage of rectum and anus 07/04/2011  . Personal history of colonic polyps 07/04/2011  . Family history of malignant neoplasm of gastrointestinal tract 07/04/2011  . HTN (hypertension) 06/17/2011   Past Medical History:  Diagnosis Date  . Arthritis    Hips   . Colon polyps 2008   [name]  . Diabetes mellitus 2006  . Hyperlipemia 2012  . Hypertension 2012   off meds at 01-08-18 PV   . Obesity     Family History  Problem Relation Age of Onset  . Heart failure Father   . Hypertension Father   . Colon cancer Father 3  . Diabetes Father   . Hypertension Mother   . Breast cancer Maternal Grandmother   . Diabetes Paternal Grandmother   . Colon polyps Neg Hx   . Esophageal cancer Neg Hx   . Rectal cancer Neg Hx   .  Stomach cancer Neg Hx     Past Surgical History:  Procedure Laterality Date  . BREAST BIOPSY  2003  . COLONOSCOPY    . POLYPECTOMY    . TUBAL LIGATION  1990  . VAGINAL HYSTERECTOMY  2006   Social History   Occupational History  . Occupation: Therapist, sports  Tobacco Use  . Smoking status: Never Smoker  . Smokeless tobacco: Never Used  Substance and Sexual Activity  . Alcohol use: No  . Drug use: No  . Sexual activity: Not on file

## 2018-06-05 ENCOUNTER — Other Ambulatory Visit: Payer: Self-pay | Admitting: Internal Medicine

## 2018-06-09 ENCOUNTER — Other Ambulatory Visit: Payer: Self-pay | Admitting: Internal Medicine

## 2018-06-09 DIAGNOSIS — E0822 Diabetes mellitus due to underlying condition with diabetic chronic kidney disease: Secondary | ICD-10-CM

## 2018-06-09 DIAGNOSIS — N182 Chronic kidney disease, stage 2 (mild): Principal | ICD-10-CM

## 2018-06-10 ENCOUNTER — Other Ambulatory Visit: Payer: Self-pay | Admitting: Internal Medicine

## 2018-06-10 DIAGNOSIS — E1151 Type 2 diabetes mellitus with diabetic peripheral angiopathy without gangrene: Secondary | ICD-10-CM

## 2018-06-10 DIAGNOSIS — IMO0002 Reserved for concepts with insufficient information to code with codable children: Secondary | ICD-10-CM

## 2018-06-10 DIAGNOSIS — E1165 Type 2 diabetes mellitus with hyperglycemia: Principal | ICD-10-CM

## 2018-07-02 ENCOUNTER — Telehealth: Payer: Self-pay | Admitting: Internal Medicine

## 2018-07-02 MED ORDER — DULAGLUTIDE 1.5 MG/0.5ML ~~LOC~~ SOAJ: SUBCUTANEOUS | 0 refills | Status: DC

## 2018-07-02 NOTE — Telephone Encounter (Signed)
TRULICITY 1.5 UP/7.3HD SOPN   Patient needs refills sent into her [place], [place]

## 2018-07-02 NOTE — Telephone Encounter (Signed)
Sent!

## 2018-07-09 ENCOUNTER — Telehealth: Payer: Self-pay | Admitting: Internal Medicine

## 2018-07-09 DIAGNOSIS — E1165 Type 2 diabetes mellitus with hyperglycemia: Principal | ICD-10-CM

## 2018-07-09 DIAGNOSIS — E1151 Type 2 diabetes mellitus with diabetic peripheral angiopathy without gangrene: Secondary | ICD-10-CM

## 2018-07-09 DIAGNOSIS — IMO0002 Reserved for concepts with insufficient information to code with codable children: Secondary | ICD-10-CM

## 2018-07-09 MED ORDER — CANAGLIFLOZIN 300 MG PO TABS: ORAL_TABLET | ORAL | 1 refills | Status: DC

## 2018-07-09 NOTE — Telephone Encounter (Signed)
Patient needs RX for Invokana sent to [facility] on [facility] (patient only has 2 pills left)

## 2018-07-09 NOTE — Telephone Encounter (Signed)
Sent!

## 2018-07-26 ENCOUNTER — Other Ambulatory Visit: Payer: Self-pay | Admitting: Internal Medicine

## 2018-08-14 ENCOUNTER — Other Ambulatory Visit: Payer: Self-pay | Admitting: Internal Medicine

## 2018-08-14 DIAGNOSIS — E1151 Type 2 diabetes mellitus with diabetic peripheral angiopathy without gangrene: Secondary | ICD-10-CM

## 2018-08-14 DIAGNOSIS — IMO0002 Reserved for concepts with insufficient information to code with codable children: Secondary | ICD-10-CM

## 2018-08-14 DIAGNOSIS — E1165 Type 2 diabetes mellitus with hyperglycemia: Principal | ICD-10-CM

## 2018-08-31 ENCOUNTER — Other Ambulatory Visit: Payer: Self-pay | Admitting: Internal Medicine

## 2018-09-05 ENCOUNTER — Other Ambulatory Visit: Payer: Self-pay | Admitting: Internal Medicine

## 2018-09-05 DIAGNOSIS — E0822 Diabetes mellitus due to underlying condition with diabetic chronic kidney disease: Secondary | ICD-10-CM

## 2018-09-05 DIAGNOSIS — N182 Chronic kidney disease, stage 2 (mild): Secondary | ICD-10-CM

## 2018-09-09 ENCOUNTER — Telehealth: Payer: Self-pay | Admitting: Internal Medicine

## 2018-09-09 ENCOUNTER — Other Ambulatory Visit: Payer: Self-pay | Admitting: Internal Medicine

## 2018-09-09 DIAGNOSIS — E1165 Type 2 diabetes mellitus with hyperglycemia: Principal | ICD-10-CM

## 2018-09-09 DIAGNOSIS — IMO0002 Reserved for concepts with insufficient information to code with codable children: Secondary | ICD-10-CM

## 2018-09-09 DIAGNOSIS — E1151 Type 2 diabetes mellitus with diabetic peripheral angiopathy without gangrene: Secondary | ICD-10-CM

## 2018-09-09 NOTE — Telephone Encounter (Signed)
This was already sent and confirmed received by pharmacy.

## 2018-09-09 NOTE — Telephone Encounter (Signed)
Patient requests Rx for Invokana be sent to [facility] on [address] asap. Patient is out of medication. Pharmacy told patient she would need to call our office and have RX sent asap.

## 2018-09-17 ENCOUNTER — Encounter: Payer: Self-pay | Admitting: Internal Medicine

## 2018-09-17 ENCOUNTER — Ambulatory Visit: Payer: 59 | Admitting: Internal Medicine

## 2018-09-17 VITALS — BP 130/80 | HR 87 | Ht 65.0 in | Wt 231.0 lb

## 2018-09-17 DIAGNOSIS — E669 Obesity, unspecified: Secondary | ICD-10-CM | POA: Diagnosis not present

## 2018-09-17 DIAGNOSIS — E1151 Type 2 diabetes mellitus with diabetic peripheral angiopathy without gangrene: Secondary | ICD-10-CM

## 2018-09-17 DIAGNOSIS — E1165 Type 2 diabetes mellitus with hyperglycemia: Secondary | ICD-10-CM

## 2018-09-17 DIAGNOSIS — E785 Hyperlipidemia, unspecified: Secondary | ICD-10-CM | POA: Diagnosis not present

## 2018-09-17 DIAGNOSIS — IMO0002 Reserved for concepts with insufficient information to code with codable children: Secondary | ICD-10-CM

## 2018-09-17 DIAGNOSIS — N760 Acute vaginitis: Secondary | ICD-10-CM

## 2018-09-17 LAB — POCT GLYCOSYLATED HEMOGLOBIN (HGB A1C): Hemoglobin A1C: 6.6 % — AB (ref 4.0–5.6)

## 2018-09-17 MED ORDER — CANAGLIFLOZIN 300 MG PO TABS: ORAL_TABLET | ORAL | 3 refills | Status: DC

## 2018-09-17 MED ORDER — FLUCONAZOLE 150 MG PO TABS: ORAL_TABLET | ORAL | 1 refills | Status: DC

## 2018-09-17 MED ORDER — DULAGLUTIDE 1.5 MG/0.5ML ~~LOC~~ SOAJ: SUBCUTANEOUS | 3 refills | Status: DC

## 2018-09-17 NOTE — Progress Notes (Signed)
Patient ID: Angela Barr, female   DOB: 1961-01-29, 57 y.o.   MRN: [id number]  HPI: Angela Barr is a 57 y.o.-year-old female, returning for f/u for DM2, dx in 2007, non-insulin-dependent, uncontrolled, without long term complications, but with hyperglycemia. Last visit 4 months ago.  Before last visit, she started to improve her diet by cutting down carbs. She continues with diet improvement. Sugars are better and she also lost weight (only few pounds gained in the last 2 weeks as she was not exercising after a fall). She stopped Amaryl since last visit.   Last hemoglobin A1c was: Lab Results  Component Value Date   HGBA1C 6.4 (A) 05/14/2018   HGBA1C 7.3 02/08/2018   HGBA1C 6.6 11/11/2017  09/25/2015: HbA1c 7.1%  She is now on: - Metformin XR 1500 mg daily - Invokana 300 mg before breakfast - has a yeast inf - Trulicity 1.5 mg weekly - some nausea - Glimepiride 4 >> 2 mg twice a day before meals >> stopped She was on Invokana 300 mg daily in am >> not covered Stopped Trulicity 1.5 mg >> stopped b/c Nausea and HAs, and also expensive She was Bydureon >> lumps at inj. Site.  She was on Victoza, now too expensive.  Pt is checking sugars once a day - am: 133-181, 191 >> 79, 88-138, 146 >> 85 - 2h after b'fast: 79, 142, 203 >> 130, 151 >> n/c - before lunch: n/c >> 86, 98 >> 104-112 >> 85-140 - 2h after lunch: 95-113, 176 >> 88 >> 105, 112 - before dinner: 131 >> 86 >> 109 >> n/c - 2h after dinner: 79, 193 >> n/c - bedtime:  n/c >> 156 >> 102 >> 149 - nighttime: n/c >> 70s occas. >> 129 Lowest sugar was  86 >> 70s >> 85; she has hypoglycemia awareness in the 90s Highest sugar was 191 >> 146 x1 >> 149.  Glucometer: One Touch Verio Flex   Not drinking sweet teas and sodas anymore Eats sunflower seeds and nuts.  -No CKD, last BUN/creatinine:  Lab Results  Component Value Date   BUN 12 05/14/2018   CREATININE 0.58 05/14/2018   Lab Results  Component Value Date   MICRALBCREAT 0.6 05/14/2018   MICRALBCREAT 0.7 02/05/2016   MICRALBCREAT 0.7 12/25/2014   MICRALBCREAT 2.0 06/13/2014   MICRALBCREAT 0.7 04/04/2013   MICRALBCREAT 0.5 01/30/2012   MICRALBCREAT 0.3 06/17/2011  We stopped lisinopril since she had low blood pressure and dizziness in the past. ACR's >> not high in last 5 years. 24 h urine for proteins >> normal.  No history of HTN.  -+ HL; last set of lipids: Lab Results  Component Value Date   CHOL 181 01/26/2018   HDL 54.60 01/26/2018   LDLCALC 113 (H) 01/26/2018   TRIG 66.0 01/26/2018   CHOLHDL 3 01/26/2018  On Crestor 10 - off and on.. - last eye exam was in 2019: No DR - no numbness and tingling in her feet.  ROS: Constitutional: + Weight gain/+ weight loss, no fatigue, + subjective hyperthermia, no subjective hypothermia, + nocturia, + burning with urination Eyes: no blurry vision, no xerophthalmia ENT: no sore throat, no nodules palpated in neck, no dysphagia, no odynophagia, no hoarseness Cardiovascular: no CP/no SOB/no palpitations/no leg swelling Respiratory: no cough/no SOB/no wheezing Gastrointestinal: + N/no V/no D/no C/no acid reflux Musculoskeletal: no muscle aches/no joint aches Skin: no rashes, no hair loss Neurological: no tremors/no numbness/no tingling/no dizziness, + headache  I reviewed pt's medications, allergies, PMH, social  hx, family hx, and changes were documented in the history of present illness. Otherwise, unchanged from my initial visit note.  Past Medical History:  Diagnosis Date  . Arthritis    Hips   . Colon polyps 2008   [name]  . Diabetes mellitus 2006  . Hyperlipemia 2012  . Hypertension 2012   off meds at 01-08-18 PV   . Obesity    Past Surgical History:  Procedure Laterality Date  . BREAST BIOPSY  2003  . COLONOSCOPY    . POLYPECTOMY    . TUBAL LIGATION  1990  . VAGINAL HYSTERECTOMY  2006   History   Social History  . Marital Status: Married    Spouse Name:  N/A  . Number of Children: 3   Occupational History  . RN    Social History Main Topics  . Smoking status: Never Smoker   . Smokeless tobacco: Never Used  . Alcohol Use: No  . Drug Use: No   Social History Narrative   3 caffeine drinks daily    Current Outpatient Medications on File Prior to Visit  Medication Sig Dispense Refill  . ciprofloxacin (CIPRO) 250 MG tablet Take 1 tablet (250 mg total) by mouth 2 (two) times daily. 6 tablet 0  . empagliflozin (JARDIANCE) 25 MG TABS tablet Take 25 mg by mouth daily. 90 tablet 3  . fluconazole (DIFLUCAN) 150 MG tablet TAKE 1 TABLET BY MOUTH EVERY DAY FOR 1 DOSE. MAY REPEAT IN 3 DAYS AS NEEDED 2 tablet 0  . glimepiride (AMARYL) 2 MG tablet TAKE 1 TABLET(2 MG) BY MOUTH TWICE DAILY 180 tablet 1  . glucose blood (ONETOUCH VERIO) test strip Check your blood sugar twice daily 100 each 12  . INVOKANA 300 MG TABS tablet TAKE 1 TABLET(300 MG) BY MOUTH DAILY BEFORE BREAKFAST 30 tablet 0  . INVOKANA 300 MG TABS tablet TAKE 1 TABLET(300 MG) BY MOUTH DAILY BEFORE BREAKFAST 30 tablet 0  . metFORMIN (GLUCOPHAGE-XR) 500 MG 24 hr tablet TAKE 1 TABLET BY MOUTH EVERY MORNING AND 2 TABLETS WITH DINNER 270 tablet 0  . mupirocin ointment (BACTROBAN) 2 % Place 1 application into the nose 2 (two) times daily. 22 g 0  . rosuvastatin (CRESTOR) 10 MG tablet Take 1 tablet (10 mg total) by mouth at bedtime. 30 tablet 2  . TRULICITY 1.5 ZO/1.0RU SOPN INJECT 0.5 ML(1.5 MG) UNDER THE SKIN ONCE A WEEK 2 mL 0   No current facility-administered medications on file prior to visit.    Allergies  Allergen Reactions  . Bactrim Nausea Only  . Sulfa Antibiotics Nausea And Vomiting  . Sulfamethoxazole-Trimethoprim    Family History  Problem Relation Age of Onset  . Heart failure Father   . Hypertension Father   . Colon cancer Father 88  . Diabetes Father   . Hypertension Mother   . Breast cancer Maternal Grandmother   . Diabetes Paternal Grandmother   . Colon polyps Neg  Hx   . Esophageal cancer Neg Hx   . Rectal cancer Neg Hx   . Stomach cancer Neg Hx    PE: BP 130/80   Pulse 87   Ht 5\' 5"  (1.651 m)   Wt 231 lb (104.8 kg)   SpO2 98%   BMI 38.44 kg/m  Body mass index is 38.44 kg/m. Wt Readings from Last 3 Encounters:  09/17/18 231 lb (104.8 kg)  05/14/18 226 lb (102.5 kg)  02/08/18 232 lb 6.4 oz (105.4 kg)   Constitutional: overweight, in  NAD Eyes: PERRLA, EOMI, no exophthalmos ENT: moist mucous membranes, no thyromegaly, no cervical lymphadenopathy Cardiovascular: RRR, No MRG Respiratory: CTA B Gastrointestinal: abdomen soft, NT, ND, BS+ Musculoskeletal: no deformities, strength intact in all 4 Skin: moist, warm, no rashes Neurological: no tremor with outstretched hands, DTR normal in all 4  ASSESSMENT: 1. DM2, non-insulin-dependent, now better controlled, without long term complications, but with hyperglycemia  2. Obesity class 2 BMI Classification:  < 18.5 underweight   18.5-24.9 normal weight   25.0-29.9 overweight   30.0-34.9 class I obesity   35.0-39.9 class II obesity  ? 40.0 class III obesity   3. HL  4.  Yeast vaginitis  PLAN:  1. Patient with long-standing, uncontrolled, type 2 diabetes, with improvement in blood sugars at last visit, with almost all sugars at goal, with few exceptions.  She continues on metformin, glimepiride, and also SGLT2 inhibitor and GLP-1 receptor agonist.  At last visit, she was having low blood sugars midday so we decreased her glimepiride dose.  Since then, she stopped Levothroid completely as sugars improved further. - At this visit, sugars are all at goal so we do not have to start glimepiride back.  However, I did advise her that she can use a low dose of the sulfonylurea before a larger meal especially during the holidays. - I recommended:  Patient Instructions  Please continue: - Metformin XR 1500 mg daily (500 mg at lunch and 1000 mg with dinner) - Invokana 300 mg before breakfast -  Trulicity 1.5 mg weekly  Take Glimepiride 2 mg before a large meal.  Please return in 4 months with your sugar log.   - today, HbA1c is 6.6% (slightly higher, but still at goal) - continue checking sugars at different times of the day - check 1x a day, rotating checks - advised for yearly eye exams >> she is UTD - UTD with flu shot - Return to clinic in 4 mo with sugar log    2. Obesity class 2 -Continue Invokana and Trulicity which should also help with weight loss -We will restart exercise soon.  3. HL - Reviewed latest lipid panel from 01/2018: LDL higher than target Lab Results  Component Value Date   CHOL 181 01/26/2018   HDL 54.60 01/26/2018   LDLCALC 113 (H) 01/26/2018   TRIG 66.0 01/26/2018   CHOLHDL 3 01/26/2018  - Continues Crestor without side effects, but does not take this consistently. - Continue with diet improvement  4.  Yeast vaginitis -Sent a prescription for Diflucan to her pharmacy.  Philemon Kingdom, MD PhD [facility] End[facility]

## 2018-09-17 NOTE — Patient Instructions (Signed)
Please continue: - Metformin XR 1500 mg daily (500 mg at lunch and 1000 mg with dinner) - Invokana 300 mg before breakfast - Trulicity 1.5 mg weekly  Take Glimepiride 2 mg before a large meal.  Please return in 4 months with your sugar log.

## 2018-11-01 ENCOUNTER — Telehealth: Payer: Self-pay | Admitting: Family Medicine

## 2018-11-01 ENCOUNTER — Encounter: Payer: Self-pay | Admitting: Family Medicine

## 2018-11-01 ENCOUNTER — Ambulatory Visit: Payer: 59 | Admitting: Family Medicine

## 2018-11-01 VITALS — BP 126/76 | HR 94 | Temp 97.7°F | Resp 16 | Ht 65.0 in | Wt 229.6 lb

## 2018-11-01 DIAGNOSIS — E785 Hyperlipidemia, unspecified: Secondary | ICD-10-CM

## 2018-11-01 DIAGNOSIS — E1169 Type 2 diabetes mellitus with other specified complication: Secondary | ICD-10-CM | POA: Diagnosis not present

## 2018-11-01 DIAGNOSIS — R42 Dizziness and giddiness: Secondary | ICD-10-CM | POA: Diagnosis not present

## 2018-11-01 DIAGNOSIS — H6503 Acute serous otitis media, bilateral: Secondary | ICD-10-CM | POA: Diagnosis not present

## 2018-11-01 MED ORDER — CEFDINIR 300 MG PO CAPS: 300.0000 mg | ORAL_CAPSULE | Freq: Two times a day (BID) | ORAL | 0 refills | Status: DC

## 2018-11-01 MED ORDER — LORATADINE 10 MG PO TABS: 10.0000 mg | ORAL_TABLET | Freq: Every day | ORAL | 11 refills | Status: DC

## 2018-11-01 MED ORDER — FLUTICASONE PROPIONATE 50 MCG/ACT NA SUSP: 2.0000 | Freq: Every day | NASAL | 6 refills | Status: DC

## 2018-11-01 NOTE — Telephone Encounter (Signed)
Copied from [place] [id number]. Topic: Quick Communication - Rx Refill/Question >> Nov 01, 2018  4:19 PM Rutherford Nail, NT wrote: **Patient states that Dr Etter Sjogren wanted her to continue to take this medication. States that she thought she had some at home, but did not. Pharmacy could not refill because it had been over a year since she'd refilled this. Needing new prescription**  Medication: rosuvastatin (CRESTOR) 10 MG tablet  Has the patient contacted their pharmacy? Yes.  To call office since she has not has it refilled in over a year. (Agent: If no, request that the patient contact the pharmacy for the refill.) (Agent: If yes, when and what did the pharmacy advise?)  Preferred Pharmacy (with phone number or street name): [id number] - [place], [place]: Please be advised that RX refills may take up to 3 business days. We ask that you follow-up with your pharmacy.

## 2018-11-01 NOTE — Progress Notes (Signed)
Patient ID: Angela Barr, female    DOB: 1960/12/29  Age: 58 y.o. MRN: [id number]    Subjective:  Subjective  HPI Angela Barr presents for f/u and c/o vertigo- she has had 3 episodes in the last month.  No chest pain, no slurred speech,  + popping in her ears.    She has been washing her hair at home more and wonders if fluid is in her ears.  No palpitations   No congestion Review of Systems  Constitutional: Negative for chills and fever.  HENT: Positive for ear pain. Negative for congestion, postnasal drip, rhinorrhea and sinus pressure.   Respiratory: Negative for cough, chest tightness, shortness of breath and wheezing.   Cardiovascular: Negative for chest pain, palpitations and leg swelling.  Allergic/Immunologic: Negative for environmental allergies.  Neurological: Positive for dizziness and light-headedness.    History Past Medical History:  Diagnosis Date  . Arthritis    Hips   . Colon polyps 2008   [name]  . Diabetes mellitus 2006  . Hyperlipemia 2012  . Hypertension 2012   off meds at 01-08-18 [facility]   . Obesity     She has a past surgical history that includes Vaginal hysterectomy (2006); Breast biopsy (2003); Tubal ligation (1990); Colonoscopy; and Polypectomy.   Her family history includes Breast cancer in her maternal grandmother; Colon cancer (age of onset: 85) in her father; Diabetes in her father and paternal grandmother; Heart failure in her father; Hypertension in her father and mother.She reports that she has never smoked. She has never used smokeless tobacco. She reports that she does not drink alcohol or use drugs.  Current Outpatient Medications on File Prior to Visit  Medication Sig Dispense Refill  . canagliflozin (INVOKANA) 300 MG TABS tablet TAKE 1 TABLET(300 MG) BY MOUTH DAILY BEFORE BREAKFAST 90 tablet 3  . Dulaglutide (TRULICITY) 1.5 GE/9.5MW SOPN INJECT 0.5 ML(1.5 MG) UNDER THE SKIN ONCE A WEEK 12 pen 3  . fluconazole (DIFLUCAN) 150  MG tablet Take one dose 1 tablet 1  . glucose blood (ONETOUCH VERIO) test strip Check your blood sugar twice daily 100 each 12  . metFORMIN (GLUCOPHAGE-XR) 500 MG 24 hr tablet TAKE 1 TABLET BY MOUTH EVERY MORNING AND 2 TABLETS WITH DINNER 270 tablet 0   No current facility-administered medications on file prior to visit.      Objective:  Objective  Physical Exam Constitutional:      Appearance: She is well-developed.  HENT:     Head: Normocephalic and atraumatic.     Right Ear: There is mastoid tenderness. Tympanic membrane is not injected or erythematous.     Left Ear: A middle ear effusion is present. Tympanic membrane is retracted. Tympanic membrane is not erythematous.  Eyes:     Conjunctiva/sclera: Conjunctivae normal.  Neck:     Musculoskeletal: Normal range of motion and neck supple.     Thyroid: No thyromegaly.     Vascular: No carotid bruit or JVD.  Cardiovascular:     Rate and Rhythm: Normal rate and regular rhythm.     Heart sounds: Normal heart sounds. No murmur.  Pulmonary:     Effort: Pulmonary effort is normal. No respiratory distress.     Breath sounds: Normal breath sounds. No wheezing or rales.  Chest:     Chest wall: No tenderness.  Neurological:     Mental Status: She is alert and oriented to person, place, and time.    BP 126/76 (BP Location: Right Arm, Cuff Size:  Large)   Pulse 94   Temp 97.7 F (36.5 C) (Oral)   Resp 16   Ht 5\' 5"  (1.651 m)   Wt 229 lb 9.6 oz (104.1 kg)   SpO2 97%   BMI 38.21 kg/m  Wt Readings from Last 3 Encounters:  11/01/18 229 lb 9.6 oz (104.1 kg)  09/17/18 231 lb (104.8 kg)  05/14/18 226 lb (102.5 kg)     Lab Results  Component Value Date   WBC 4.2 01/26/2018   HGB 13.9 01/26/2018   HCT 43.0 01/26/2018   PLT 221.0 01/26/2018   GLUCOSE 106 (H) 05/14/2018   CHOL 181 01/26/2018   TRIG 66.0 01/26/2018   HDL 54.60 01/26/2018   LDLCALC 113 (H) 01/26/2018   ALT 36 (H) 01/26/2018   AST 22 01/26/2018   NA 139  05/14/2018   K 4.2 05/14/2018   CL 106 05/14/2018   CREATININE 0.58 05/14/2018   BUN 12 05/14/2018   CO2 24 05/14/2018   TSH 0.49 10/16/2017   HGBA1C 6.6 (A) 09/17/2018   MICROALBUR 0.8 05/14/2018  ekg-- nsr   Mm Screening Breast Tomo Bilateral  Result Date: 02/04/2016 CLINICAL DATA:  Screening. EXAM: 2D DIGITAL SCREENING BILATERAL MAMMOGRAM WITH CAD AND ADJUNCT TOMO COMPARISON:  Previous exam(s). ACR Breast Density Category c: The breast tissue is heterogeneously dense, which may obscure small masses. FINDINGS: There are no findings suspicious for malignancy. Images were processed with CAD. IMPRESSION: No mammographic evidence of malignancy. A result letter of this screening mammogram will be mailed directly to the patient. RECOMMENDATION: Screening mammogram in one year. (Code:SM-B-01Y) BI-RADS CATEGORY  1: Negative. Electronically Signed   By: Dorise Bullion III M.D   On: 02/05/2016 07:24     Assessment & Plan:  Plan  I have discontinued Angela Barr's mupirocin ointment and rosuvastatin. I am also having her start on fluticasone, loratadine, and cefdinir. Additionally, I am having her maintain her glucose blood, metFORMIN, fluconazole, canagliflozin, and Dulaglutide.  Meds ordered this encounter  Medications  . fluticasone (FLONASE) 50 MCG/ACT nasal spray    Sig: Place 2 sprays into both nostrils daily.    Dispense:  16 g    Refill:  6  . loratadine (CLARITIN) 10 MG tablet    Sig: Take 1 tablet (10 mg total) by mouth daily.    Dispense:  30 tablet    Refill:  11  . cefdinir (OMNICEF) 300 MG capsule    Sig: Take 1 capsule (300 mg total) by mouth 2 (two) times daily.    Dispense:  20 capsule    Refill:  0    Problem List Items Addressed This Visit      Unprioritized   Dizzy    prob due to fluid in ear / eustacian tube defect      Relevant Orders   EKG 12-Lead (Completed)   Serous otitis media - Primary   Relevant Medications   fluticasone (FLONASE) 50 MCG/ACT  nasal spray   loratadine (CLARITIN) 10 MG tablet   cefdinir (OMNICEF) 300 MG capsule    Other Visit Diagnoses    Hyperlipidemia associated with type 2 diabetes mellitus ([place])       Relevant Orders   Lipid panel   Comprehensive metabolic panel      Follow-up: Return if symptoms worsen or fail to improve.  [name], DO    ----

## 2018-11-01 NOTE — Patient Instructions (Signed)

## 2018-11-01 NOTE — Assessment & Plan Note (Signed)
prob due to fluid in ear / eustacian tube defect

## 2018-11-02 LAB — COMPREHENSIVE METABOLIC PANEL
ALK PHOS: 81 U/L (ref 39–117)
ALT: 34 U/L (ref 0–35)
AST: 17 U/L (ref 0–37)
Albumin: 4.3 g/dL (ref 3.5–5.2)
BUN: 13 mg/dL (ref 6–23)
CO2: 29 meq/L (ref 19–32)
Calcium: 10 mg/dL (ref 8.4–10.5)
Chloride: 103 mEq/L (ref 96–112)
Creatinine, Ser: 0.61 mg/dL (ref 0.40–1.20)
GFR: 130.43 mL/min (ref >60.00)
GLUCOSE: 121 mg/dL — AB (ref 70–99)
Potassium: 4.4 mEq/L (ref 3.5–5.1)
SODIUM: 139 meq/L (ref 135–145)
TOTAL PROTEIN: 6.7 g/dL (ref 6.0–8.3)
Total Bilirubin: 0.2 mg/dL (ref 0.2–1.2)

## 2018-11-02 LAB — LIPID PANEL
CHOL/HDL RATIO: 4
Cholesterol: 189 mg/dL (ref 0–200)
HDL: 53.5 mg/dL (ref >39.00)
LDL Cholesterol: 121 mg/dL — ABNORMAL HIGH (ref 0–99)
NonHDL: 135.91
Triglycerides: 73 mg/dL (ref 0.0–149.0)
VLDL: 14.6 mg/dL (ref 0.0–40.0)

## 2018-11-02 MED ORDER — ROSUVASTATIN CALCIUM 10 MG PO TABS: 10.0000 mg | ORAL_TABLET | Freq: Every day | ORAL | 2 refills | Status: DC

## 2018-11-02 NOTE — Telephone Encounter (Signed)
Patient notified that rx has been sent in. 

## 2018-11-06 ENCOUNTER — Other Ambulatory Visit: Payer: Self-pay | Admitting: Family Medicine

## 2018-11-06 DIAGNOSIS — E785 Hyperlipidemia, unspecified: Principal | ICD-10-CM

## 2018-11-06 DIAGNOSIS — E1169 Type 2 diabetes mellitus with other specified complication: Secondary | ICD-10-CM

## 2018-11-11 ENCOUNTER — Telehealth: Payer: Self-pay | Admitting: *Deleted

## 2018-11-11 MED ORDER — ROSUVASTATIN CALCIUM 20 MG PO TABS: 20.0000 mg | ORAL_TABLET | Freq: Every day | ORAL | 2 refills | Status: DC

## 2018-11-11 NOTE — Telephone Encounter (Signed)
-----   Message from [name], DO sent at 11/06/2018  1:54 PM EST ----- Cholesterol--- LDL goal < 70,  HDL >40,  TG < 150.  Diet and exercise will increase HDL and decrease LDL and TG.  Fish,  Fish Oil, Flaxseed oil will also help increase the HDL and decrease Triglycerides.   Recheck labs in 3 months Increase crestor to 20 mg #30 2 refills 1 po qhs, .

## 2018-11-11 NOTE — Telephone Encounter (Signed)
Notified pt and she voices understanding. Rx sent to [facility] on [address]. Pt will call back to schedule lab appt in 3 months. Future lab orders were already entered on 11/06/18.

## 2018-11-13 ENCOUNTER — Other Ambulatory Visit: Payer: Self-pay | Admitting: Internal Medicine

## 2018-11-13 DIAGNOSIS — N760 Acute vaginitis: Secondary | ICD-10-CM

## 2018-11-15 ENCOUNTER — Other Ambulatory Visit: Payer: Self-pay | Admitting: Family Medicine

## 2018-11-15 ENCOUNTER — Other Ambulatory Visit: Payer: Self-pay

## 2018-11-15 ENCOUNTER — Telehealth: Payer: Self-pay | Admitting: Internal Medicine

## 2018-11-15 DIAGNOSIS — N760 Acute vaginitis: Secondary | ICD-10-CM

## 2018-11-15 NOTE — Telephone Encounter (Signed)
Ok to refill please advise 

## 2018-11-15 NOTE — Telephone Encounter (Signed)
error 

## 2018-11-15 NOTE — Telephone Encounter (Signed)
ok 

## 2018-11-15 NOTE — Telephone Encounter (Signed)
Copied from [place] [id number]. Topic: Quick Communication - Rx Refill/Question >> Nov 15, 2018 12:22 PM Alfredia Ferguson R wrote: Medication: fluconazole (DIFLUCAN) 150 MG tablet  Has the patient contacted their pharmacy? Yes  Preferred Pharmacy (with phone number or street name): [id number] - [place], [place] [phone] (Phone) [phone] (Fax)    Agent: Please be advised that RX refills may take up to 3 business days. We ask that you follow-up with your pharmacy.

## 2018-11-23 ENCOUNTER — Encounter: Payer: Self-pay | Admitting: Family Medicine

## 2018-11-24 NOTE — Telephone Encounter (Signed)
If she is using the flonase and claritin--- and abx finished ---- we can refer to ENT if she agrees

## 2018-11-25 ENCOUNTER — Other Ambulatory Visit: Payer: Self-pay | Admitting: Family Medicine

## 2018-11-25 DIAGNOSIS — R42 Dizziness and giddiness: Secondary | ICD-10-CM

## 2018-11-25 DIAGNOSIS — H6503 Acute serous otitis media, bilateral: Secondary | ICD-10-CM

## 2018-11-25 NOTE — Telephone Encounter (Signed)
Referral in

## 2018-12-08 ENCOUNTER — Other Ambulatory Visit: Payer: Self-pay | Admitting: Internal Medicine

## 2018-12-08 DIAGNOSIS — E0822 Diabetes mellitus due to underlying condition with diabetic chronic kidney disease: Principal | ICD-10-CM

## 2018-12-08 DIAGNOSIS — N182 Chronic kidney disease, stage 2 (mild): Secondary | ICD-10-CM

## 2018-12-09 ENCOUNTER — Telehealth (INDEPENDENT_AMBULATORY_CARE_PROVIDER_SITE_OTHER): Payer: Self-pay

## 2018-12-09 NOTE — Telephone Encounter (Signed)
Faxed the 05/25/18 office note to adj per her request

## 2018-12-17 ENCOUNTER — Telehealth: Payer: Self-pay | Admitting: *Deleted

## 2018-12-17 NOTE — Telephone Encounter (Signed)
Copied from [place] [id number]. Topic: General - Other >> Dec 17, 2018 10:29 AM Windy Kalata wrote: Reason for CRM: patient is calling stating that she needs an order for a diagnostic Mammogram  Call back is [phone]

## 2018-12-17 NOTE — Telephone Encounter (Signed)
Patient notified that she will need an appointment for diagnostic.  She has right breast swelling.  appt made for Monday.

## 2018-12-20 ENCOUNTER — Ambulatory Visit: Payer: 59 | Admitting: Family Medicine

## 2018-12-20 ENCOUNTER — Encounter: Payer: Self-pay | Admitting: Family Medicine

## 2018-12-20 VITALS — BP 145/70 | HR 68 | Resp 14 | Ht 65.0 in | Wt 229.0 lb

## 2018-12-20 DIAGNOSIS — N644 Mastodynia: Secondary | ICD-10-CM

## 2018-12-20 DIAGNOSIS — R42 Dizziness and giddiness: Secondary | ICD-10-CM | POA: Diagnosis not present

## 2018-12-20 NOTE — Progress Notes (Signed)
Patient ID: Angela Barr, female    DOB: 1961/04/20  Age: 58 y.o. MRN: [id number]    Subjective:  Subjective  HPI Angela Barr presents for asymmetry R breast and tenderness she noticed recently ---- R breast is larger than the L and irregular   Review of Systems  Constitutional: Negative for appetite change, diaphoresis, fatigue and unexpected weight change.  Eyes: Negative for pain, redness and visual disturbance.  Respiratory: Negative for cough, chest tightness, shortness of breath and wheezing.   Cardiovascular: Negative for chest pain, palpitations and leg swelling.  Endocrine: Negative for cold intolerance, heat intolerance, polydipsia, polyphagia and polyuria.  Genitourinary: Negative for difficulty urinating, dysuria and frequency.  Neurological: Negative for dizziness, light-headedness, numbness and headaches.    History Past Medical History:  Diagnosis Date  . Arthritis    Hips   . Colon polyps 2008   [name]  . Diabetes mellitus 2006  . Hyperlipemia 2012  . Hypertension 2012   off meds at 01-08-18 [facility]   . Obesity     She has a past surgical history that includes Vaginal hysterectomy (2006); Breast biopsy (2003); Tubal ligation (1990); Colonoscopy; and Polypectomy.   Her family history includes Breast cancer in her maternal grandmother; Colon cancer (age of onset: 59) in her father; Diabetes in her father and paternal grandmother; Heart failure in her father; Hypertension in her father and mother.She reports that she has never smoked. She has never used smokeless tobacco. She reports that she does not drink alcohol or use drugs.  Current Outpatient Medications on File Prior to Visit  Medication Sig Dispense Refill  . canagliflozin (INVOKANA) 300 MG TABS tablet TAKE 1 TABLET(300 MG) BY MOUTH DAILY BEFORE BREAKFAST 90 tablet 3  . cefdinir (OMNICEF) 300 MG capsule Take 1 capsule (300 mg total) by mouth 2 (two) times daily. 20 capsule 0  . Dulaglutide  (TRULICITY) 1.5 HM/0.9OB SOPN INJECT 0.5 ML(1.5 MG) UNDER THE SKIN ONCE A WEEK 12 pen 3  . fluconazole (DIFLUCAN) 150 MG tablet TAKE 1 TABLET BY MOUTH ONCE FOR 1 DOSE 1 tablet 1  . fluticasone (FLONASE) 50 MCG/ACT nasal spray Place 2 sprays into both nostrils daily. 16 g 6  . glucose blood (ONETOUCH VERIO) test strip Check your blood sugar twice daily 100 each 12  . loratadine (CLARITIN) 10 MG tablet Take 1 tablet (10 mg total) by mouth daily. 30 tablet 11  . metFORMIN (GLUCOPHAGE-XR) 500 MG 24 hr tablet TAKE 1 TABLET BY MOUTH EVERY MORNING AND 2 TABLETS WITH DINNER 270 tablet 0  . rosuvastatin (CRESTOR) 20 MG tablet Take 1 tablet (20 mg total) by mouth at bedtime. 30 tablet 2   No current facility-administered medications on file prior to visit.      Objective:  Objective  Physical Exam Vitals signs and nursing note reviewed.  Constitutional:      Appearance: She is well-developed.  HENT:     Head: Normocephalic and atraumatic.  Eyes:     Conjunctiva/sclera: Conjunctivae normal.  Neck:     Musculoskeletal: Normal range of motion and neck supple.     Thyroid: No thyromegaly.     Vascular: No carotid bruit or JVD.  Cardiovascular:     Rate and Rhythm: Normal rate and regular rhythm.     Heart sounds: Normal heart sounds. No murmur.  Pulmonary:     Effort: Pulmonary effort is normal. No respiratory distress.     Breath sounds: Normal breath sounds. No wheezing or rales.  Chest:  Chest wall: No tenderness.     Breasts: Breasts are asymmetrical.        Right: Swelling present.        Left: No swelling.    Neurological:     Mental Status: She is alert and oriented to person, place, and time.    Pulse 68   Resp 14   Ht 5\' 5"  (1.651 m)   Wt 229 lb (103.9 kg)   SpO2 96%   BMI 38.11 kg/m  Wt Readings from Last 3 Encounters:  12/20/18 229 lb (103.9 kg)  11/01/18 229 lb 9.6 oz (104.1 kg)  09/17/18 231 lb (104.8 kg)     Lab Results  Component Value Date   WBC 4.2  01/26/2018   HGB 13.9 01/26/2018   HCT 43.0 01/26/2018   PLT 221.0 01/26/2018   GLUCOSE 121 (H) 11/01/2018   CHOL 189 11/01/2018   TRIG 73.0 11/01/2018   HDL 53.50 11/01/2018   LDLCALC 121 (H) 11/01/2018   ALT 34 11/01/2018   AST 17 11/01/2018   NA 139 11/01/2018   K 4.4 11/01/2018   CL 103 11/01/2018   CREATININE 0.61 11/01/2018   BUN 13 11/01/2018   CO2 29 11/01/2018   TSH 0.49 10/16/2017   HGBA1C 6.6 (A) 09/17/2018   MICROALBUR 0.8 05/14/2018    Mm Screening Breast Tomo Bilateral  Result Date: 02/04/2016 CLINICAL DATA:  Screening. EXAM: 2D DIGITAL SCREENING BILATERAL MAMMOGRAM WITH CAD AND ADJUNCT TOMO COMPARISON:  Previous exam(s). ACR Breast Density Category c: The breast tissue is heterogeneously dense, which may obscure small masses. FINDINGS: There are no findings suspicious for malignancy. Images were processed with CAD. IMPRESSION: No mammographic evidence of malignancy. A result letter of this screening mammogram will be mailed directly to the patient. RECOMMENDATION: Screening mammogram in one year. (Code:SM-B-01Y) BI-RADS CATEGORY  1: Negative. Electronically Signed   By: Dorise Bullion III M.D   On: 02/05/2016 07:24     Assessment & Plan:  Plan  I am having Angela Barr maintain her glucose blood, canagliflozin, Dulaglutide, fluticasone, loratadine, cefdinir, rosuvastatin, fluconazole, and metFORMIN.  No orders of the defined types were placed in this encounter.   Problem List Items Addressed This Visit    None    Visit Diagnoses    Breast pain    -  Primary   Relevant Orders   MM Digital Diagnostic Bilat   US BREAST COMPLETE UNI RIGHT INC AXILLA      Follow-up: Return if symptoms worsen or fail to improve.  [name], DO

## 2018-12-27 ENCOUNTER — Telehealth: Payer: Self-pay | Admitting: Internal Medicine

## 2018-12-27 MED ORDER — DULAGLUTIDE 1.5 MG/0.5ML ~~LOC~~ SOAJ: SUBCUTANEOUS | 3 refills | Status: DC

## 2018-12-27 NOTE — Telephone Encounter (Signed)
MEDICATION: Dulaglutide (TRULICITY) 1.5 JK/9.3OI SOPN  PHARMACY:  [id number] - [place], [place] - [address]  IS THIS A 90 DAY SUPPLY :  30 day supply  IS PATIENT OUT OF MEDICATION:  Yes   IF NOT; HOW MUCH IS LEFT:   LAST APPOINTMENT DATE: @2 /09/2019  NEXT APPOINTMENT DATE:@3 /25/2020  DO WE HAVE YOUR PERMISSION TO LEAVE A DETAILED MESSAGE:  OTHER COMMENTS:    **Let patient know to contact pharmacy at the end of the day to make sure medication is ready. **  ** Please notify patient to allow 48-72 hours to process**  **Encourage patient to contact the pharmacy for refills or they can request refills through [facility]**

## 2018-12-27 NOTE — Telephone Encounter (Signed)
RX sent

## 2018-12-28 ENCOUNTER — Encounter: Payer: Self-pay | Admitting: Internal Medicine

## 2018-12-28 ENCOUNTER — Ambulatory Visit
Admission: RE | Admit: 2018-12-28 | Discharge: 2018-12-28 | Disposition: A | Discharge status: Home / Self Care | Payer: 59 | Source: Ambulatory Visit | Attending: Family Medicine | Admitting: Family Medicine

## 2018-12-28 DIAGNOSIS — N644 Mastodynia: Secondary | ICD-10-CM

## 2018-12-28 DIAGNOSIS — N6489 Other specified disorders of breast: Secondary | ICD-10-CM | POA: Diagnosis not present

## 2018-12-28 DIAGNOSIS — R928 Other abnormal and inconclusive findings on diagnostic imaging of breast: Secondary | ICD-10-CM | POA: Diagnosis not present

## 2018-12-28 DIAGNOSIS — Z803 Family history of malignant neoplasm of breast: Secondary | ICD-10-CM | POA: Diagnosis not present

## 2018-12-29 ENCOUNTER — Telehealth: Payer: Self-pay

## 2018-12-29 ENCOUNTER — Other Ambulatory Visit: Payer: Self-pay | Admitting: Internal Medicine

## 2018-12-29 MED ORDER — SEMAGLUTIDE(0.25 OR 0.5MG/DOS) 2 MG/1.5ML ~~LOC~~ SOPN: 0.5000 mg | PEN_INJECTOR | SUBCUTANEOUS | 5 refills | Status: DC

## 2018-12-29 NOTE — Telephone Encounter (Signed)
-----   Message from Philemon Kingdom, MD sent at 12/29/2018  1:04 PM EST ----- Angela Barr, this patient sent me a message that she cannot afford Trulicity.  I sent a prescription for Ozempic to her pharmacy and I advised her to come here to pick up a coupon card, that she can take to the pharmacy to be run as insurance (not through her own insurance).  Can you please prepare this for her? Ty, C

## 2018-12-29 NOTE — Telephone Encounter (Signed)
Card placed at front desk for pick up.

## 2018-12-30 ENCOUNTER — Telehealth: Payer: Self-pay | Admitting: Internal Medicine

## 2018-12-30 NOTE — Telephone Encounter (Signed)
Dr. Cruzita Lederer already notified her via [place], I did not call patient.

## 2018-12-30 NOTE — Telephone Encounter (Signed)
Patient went to pick Ozempic from the [facility] but the medication is $750.00 (discount card took off $150.00 which brought it down to $648.00). Patient states the injection RX's are going to be too expensive for her. Please call patient at ph# [phone] to advise.

## 2018-12-30 NOTE — Telephone Encounter (Signed)
Let's try to continue w/o Ozempic/Trulicity, then. Keep a close eye on her sugars and let us know if increasing significantly.

## 2018-12-30 NOTE — Telephone Encounter (Signed)
Per [facility] "Caller states she is returning a call regarding her medication."

## 2018-12-31 NOTE — Telephone Encounter (Signed)
Per [facility], "Caller states that she is calling and she hasn't received a call back from the doctor."

## 2018-12-31 NOTE — Telephone Encounter (Signed)
Pt is aware of note to stop ozempic and trulicity.  She will call us back if the BS start to increase significantly. So far has only has slight increase in BS

## 2019-01-07 ENCOUNTER — Telehealth: Payer: Self-pay | Admitting: Internal Medicine

## 2019-01-07 MED ORDER — GLIMEPIRIDE 2 MG PO TABS: ORAL_TABLET | ORAL | 3 refills | Status: DC

## 2019-01-07 NOTE — Telephone Encounter (Signed)
RX sent.  Notified patient of message from Dr. Cruzita Lederer, patient expressed understanding and agreement. No further questions.

## 2019-01-07 NOTE — Telephone Encounter (Signed)
Depending on when the sugars are high, she may start glimepiride 2 mg before breakfast and before dinner.  If sugars are still high after these meals, she can increase to 4 mg twice a day.  If sugars remain high, we will probably need long-acting insulin at that point

## 2019-01-07 NOTE — Telephone Encounter (Signed)
Patient states she was told to call if she felt her blood sugars are going up. Patient has a 270 and a 235. Please call patient at ph# [phone] to advise.

## 2019-01-14 ENCOUNTER — Other Ambulatory Visit (INDEPENDENT_AMBULATORY_CARE_PROVIDER_SITE_OTHER): Payer: 59

## 2019-01-14 ENCOUNTER — Other Ambulatory Visit: Payer: Self-pay

## 2019-01-14 DIAGNOSIS — E1169 Type 2 diabetes mellitus with other specified complication: Secondary | ICD-10-CM

## 2019-01-14 DIAGNOSIS — E785 Hyperlipidemia, unspecified: Secondary | ICD-10-CM

## 2019-01-14 LAB — LIPID PANEL
CHOLESTEROL: 142 mg/dL (ref 0–200)
HDL: 50.3 mg/dL (ref >39.00)
LDL Cholesterol: 78 mg/dL (ref 0–99)
NonHDL: 91.99
TRIGLYCERIDES: 70 mg/dL (ref 0.0–149.0)
Total CHOL/HDL Ratio: 3
VLDL: 14 mg/dL (ref 0.0–40.0)

## 2019-01-14 LAB — COMPREHENSIVE METABOLIC PANEL
ALT: 42 U/L — ABNORMAL HIGH (ref 0–35)
AST: 22 U/L (ref 0–37)
Albumin: 4.2 g/dL (ref 3.5–5.2)
Alkaline Phosphatase: 76 U/L (ref 39–117)
BUN: 15 mg/dL (ref 6–23)
CO2: 26 mEq/L (ref 19–32)
Calcium: 9.5 mg/dL (ref 8.4–10.5)
Chloride: 104 mEq/L (ref 96–112)
Creatinine, Ser: 0.61 mg/dL (ref 0.40–1.20)
GFR: 122.63 mL/min (ref >60.00)
Glucose, Bld: 137 mg/dL — ABNORMAL HIGH (ref 70–99)
Potassium: 4.1 mEq/L (ref 3.5–5.1)
Sodium: 140 mEq/L (ref 135–145)
Total Bilirubin: 0.3 mg/dL (ref 0.2–1.2)
Total Protein: 6.5 g/dL (ref 6.0–8.3)

## 2019-01-18 ENCOUNTER — Other Ambulatory Visit: Payer: Self-pay

## 2019-01-19 ENCOUNTER — Ambulatory Visit: Payer: 59 | Admitting: Internal Medicine

## 2019-01-19 ENCOUNTER — Encounter: Payer: Self-pay | Admitting: Internal Medicine

## 2019-01-19 VITALS — BP 130/80 | HR 87 | Temp 98.1°F | Ht 65.0 in | Wt 227.0 lb

## 2019-01-19 DIAGNOSIS — E669 Obesity, unspecified: Secondary | ICD-10-CM

## 2019-01-19 DIAGNOSIS — E1151 Type 2 diabetes mellitus with diabetic peripheral angiopathy without gangrene: Secondary | ICD-10-CM | POA: Diagnosis not present

## 2019-01-19 DIAGNOSIS — E785 Hyperlipidemia, unspecified: Secondary | ICD-10-CM

## 2019-01-19 DIAGNOSIS — IMO0002 Reserved for concepts with insufficient information to code with codable children: Secondary | ICD-10-CM

## 2019-01-19 DIAGNOSIS — E1165 Type 2 diabetes mellitus with hyperglycemia: Secondary | ICD-10-CM | POA: Diagnosis not present

## 2019-01-19 DIAGNOSIS — N760 Acute vaginitis: Secondary | ICD-10-CM

## 2019-01-19 LAB — POCT GLYCOSYLATED HEMOGLOBIN (HGB A1C): Hemoglobin A1C: 7.2 % — AB (ref 4.0–5.6)

## 2019-01-19 MED ORDER — FLUCONAZOLE 150 MG PO TABS: ORAL_TABLET | ORAL | 1 refills | Status: DC

## 2019-01-19 NOTE — Addendum Note (Signed)
Addended by: Cardell Peach I on: 01/19/2019 12:59 PM   Modules accepted: Orders

## 2019-01-19 NOTE — Patient Instructions (Addendum)
Please continue: - Metformin XR 500 mg with lunch and 1000 mg with dinner - Invokana 300 mg before breakfast - Glimepiride 2-4 mg twice a day before meals  Please restart: Trulicity 1.5 mg/Ozempic 0.5 mg weekly.  Please return in 4 months with your sugar log.

## 2019-01-19 NOTE — Progress Notes (Signed)
Patient ID: Angela Barr, female   DOB: 1961/01/19, 58 y.o.   MRN: [id number]  HPI: Angela Barr is a 58 y.o.-year-old female, returning for f/u for DM2, dx in 2007, non-insulin-dependent, uncontrolled, without long term complications, but with hyperglycemia. Last visit 4 months ago.  She had vertigo since last visit.  This improved after stopping diet sodas.  She does have an appointment coming up with neurology.  She is unfortunately off Trulicity 2/2 price. Ozempic not covered either (not even with coupon card).  She will probably be able to afford these again after June.  Last hemoglobin A1c was: Lab Results  Component Value Date   HGBA1C 6.6 (A) 09/17/2018   HGBA1C 6.4 (A) 05/14/2018   HGBA1C 7.3 02/08/2018  09/25/2015: HbA1c 7.1%  She is now on: - Metformin XR XR 500 mg with lunch and 1000 mg with dinner - Invokana 300 mg before breakfast -she has had several yeast infections, including now - Stopped 0/2020 - Glimepiride 4 >> 2 mg in am and 2-4 mg before dinner She was on Invokana 300 mg daily in am >> not covered Stopped Trulicity 1.5 mg >> stopped b/c Nausea and HAs, and also expensive She was Bydureon >> lumps at inj. Site.  She was on Victoza, now too expensive.  Pt is checking sugars 1x a day-they are higher: - am:  79, 88-138, 146 >> 85 >> 147-190, 197 - 2h after b'fast: 130, 151 >> n/c >> 125-171, 197 - before lunch: 104-112 >> 85-140 >> 141-173 - 2h after lunch: 88 >> 105, 112 >> 144-220, 270 - before dinner: 131 >> 86 >> 109 >> n/c >> 101, 167 - 2h after dinner: 79, 193 >> n/c >> 167-227 - bedtime:  n/c >> 156 >> 102 >> 149 - nighttime: n/c >> 70s occas. >> 129 Lowest sugar was 85 >> 101; she has hypoglycemia awareness in the 90s. Highest sugar was 149 >> 270.  Glucometer: One Touch Verio Flex   Eats sunflower seeds and nuts.  -+CKD;  last BUN/creatinine:  Lab Results  Component Value Date   BUN 15 01/14/2019   CREATININE 0.61 01/14/2019   Lab  Results  Component Value Date   MICRALBCREAT 0.6 05/14/2018   MICRALBCREAT 0.7 02/05/2016   MICRALBCREAT 0.7 12/25/2014   MICRALBCREAT 2.0 06/13/2014   MICRALBCREAT 0.7 04/04/2013   MICRALBCREAT 0.5 01/30/2012   MICRALBCREAT 0.3 06/17/2011  We stopped Lisinopril as she had low blood pressure and dizziness in the past ACR's >> not high in last 5 years. 24 h urine for proteins >> normal.  No history of HTN.  -+ HL; last set of lipids: Lab Results  Component Value Date   CHOL 142 01/14/2019   HDL 50.30 01/14/2019   LDLCALC 78 01/14/2019   TRIG 70.0 01/14/2019   CHOLHDL 3 01/14/2019  On Crestor 20. - last eye exam was in 2019: No DR - no numbness and tingling in her feet.  ROS: Constitutional: no weight gain/no weight loss, no fatigue, no subjective hyperthermia, no subjective hypothermia Eyes: no blurry vision, no xerophthalmia ENT: no sore throat, no nodules palpated in neck, no dysphagia, no odynophagia, no hoarseness Cardiovascular: no CP/no SOB/no palpitations/no leg swelling Respiratory: no cough/no SOB/no wheezing Gastrointestinal: no N/no V/no D/no C/no acid reflux Musculoskeletal: no muscle aches/no joint aches Skin: no rashes, no hair loss Neurological: no tremors/no numbness/no tingling/+ dizziness  I reviewed pt's medications, allergies, PMH, social hx, family hx, and changes were documented in the history of present illness.  Otherwise, unchanged from my initial visit note.  Past Medical History:  Diagnosis Date  . Arthritis    Hips   . Colon polyps 2008   [name]  . Diabetes mellitus 2006  . Hyperlipemia 2012  . Hypertension 2012   off meds at 01-08-18 PV   . Obesity    Past Surgical History:  Procedure Laterality Date  . BREAST BIOPSY  2003  . COLONOSCOPY    . POLYPECTOMY    . TUBAL LIGATION  1990  . VAGINAL HYSTERECTOMY  2006   History   Social History  . Marital Status: Married    Spouse Name: N/A  . Number of Children: 3    Occupational History  . RN    Social History Main Topics  . Smoking status: Never Smoker   . Smokeless tobacco: Never Used  . Alcohol Use: No  . Drug Use: No   Social History Narrative   3 caffeine drinks daily    Current Outpatient Medications on File Prior to Visit  Medication Sig Dispense Refill  . canagliflozin (INVOKANA) 300 MG TABS tablet TAKE 1 TABLET(300 MG) BY MOUTH DAILY BEFORE BREAKFAST 90 tablet 3  . cefdinir (OMNICEF) 300 MG capsule Take 1 capsule (300 mg total) by mouth 2 (two) times daily. 20 capsule 0  . fluconazole (DIFLUCAN) 150 MG tablet TAKE 1 TABLET BY MOUTH ONCE FOR 1 DOSE 1 tablet 1  . fluticasone (FLONASE) 50 MCG/ACT nasal spray Place 2 sprays into both nostrils daily. 16 g 6  . glimepiride (AMARYL) 2 MG tablet Take 1 tablet 2 times a day before breakfast and dinner. 90 tablet 3  . glucose blood (ONETOUCH VERIO) test strip Check your blood sugar twice daily 100 each 12  . loratadine (CLARITIN) 10 MG tablet Take 1 tablet (10 mg total) by mouth daily. 30 tablet 11  . metFORMIN (GLUCOPHAGE-XR) 500 MG 24 hr tablet TAKE 1 TABLET BY MOUTH EVERY MORNING AND 2 TABLETS WITH DINNER 270 tablet 0  . rosuvastatin (CRESTOR) 20 MG tablet Take 1 tablet (20 mg total) by mouth at bedtime. 30 tablet 2  . Semaglutide,0.25 or 0.5MG /DOS, (OZEMPIC, 0.25 OR 0.5 MG/DOSE,) 2 MG/1.5ML SOPN Inject 0.5 mg into the skin once a week. 3 pen 5   No current facility-administered medications on file prior to visit.    Allergies  Allergen Reactions  . Bactrim Nausea Only  . Sulfa Antibiotics Nausea And Vomiting  . Sulfamethoxazole-Trimethoprim    Family History  Problem Relation Age of Onset  . Heart failure Father   . Hypertension Father   . Colon cancer Father 27  . Diabetes Father   . Hypertension Mother   . Breast cancer Maternal Grandmother   . Diabetes Paternal Grandmother   . Colon polyps Neg Hx   . Esophageal cancer Neg Hx   . Rectal cancer Neg Hx   . Stomach cancer Neg  Hx    PE: BP 130/80   Pulse 87   Temp 98.1 F (36.7 C)   Ht 5\' 5"  (1.651 m)   Wt 227 lb (103 kg)   SpO2 98%   BMI 37.77 kg/m  Body mass index is 37.77 kg/m. Wt Readings from Last 3 Encounters:  01/19/19 227 lb (103 kg)  12/20/18 229 lb (103.9 kg)  11/01/18 229 lb 9.6 oz (104.1 kg)   Constitutional: overweight, in NAD Eyes: PERRLA, EOMI, no exophthalmos ENT: moist mucous membranes, no thyromegaly, no cervical lymphadenopathy Cardiovascular: RRR, No MRG Respiratory: CTA B Gastrointestinal:  abdomen soft, NT, ND, BS+ Musculoskeletal: no deformities, strength intact in all 4 Skin: moist, warm, no rashes Neurological: no tremor with outstretched hands, DTR normal in all 4  ASSESSMENT: 1. DM2, non-insulin-dependent, now better controlled, without long term complications, but with hyperglycemia  2. Obesity class 2 BMI Classification:  < 18.5 underweight   18.5-24.9 normal weight   25.0-29.9 overweight   30.0-34.9 class I obesity   35.0-39.9 class II obesity  ? 40.0 class III obesity   3. HL  PLAN:  1. Patient with longstanding, uncontrolled, type 2 diabetes, with improvement in blood sugars after adding SGLT2 inhibitor and GLP-1 receptor agonist.  However, we could not continue the GLP-1 receptor agonist due to price.  She now continues on metformin, sulfonylurea, and SGLT 2 inhibitor.  In the past, she had low blood sugars on higher doses of glimepiride so we had reduced the dose. -At this visit, her sugars are higher.  She has hyperglycemic spikes, but also, morning sugars are usually higher.  I gave her samples of GLP-1 receptor agonists as she can only afford these medicines after 3 months from now.  I could only cover her for 2 months, but I advised her to let us know when she is close to running out as we will probably get more samples to the office.  I also advised her that if we restart the GLP-1 receptor agonist, she may be able to come off glimepiride.  For now,  continue the same dose. - I recommended:  Patient Instructions  Please continue: - Metformin XR 500 mg with lunch and 1000 mg with dinner - Invokana 300 mg before breakfast - Glimepiride 2-4 mg twice a day before meals  Please restart: Trulicity 1.5 mg/Ozempic 0.5 mg weekly.  Please return in 4 months with your sugar log.   - today, HbA1c is 7.2% (higher) - continue checking sugars at different times of the day - check 1x a day, rotating checks - advised for yearly eye exams >> she is UTD - Return to clinic in 4 mo with sugar log     2. Obesity class 2 -Continue Invokana which should also help with weight loss.  Unfortunately, she had to stop the GLP-1 receptor agonist as she has a high deductible and could not afford them.  At this visit, I gave her samples. -No weight gain since last visit  3. HL - Reviewed latest lipid panel from 12/2018: Fractions at goal Lab Results  Component Value Date   CHOL 142 01/14/2019   HDL 50.30 01/14/2019   LDLCALC 78 01/14/2019   TRIG 70.0 01/14/2019   CHOLHDL 3 01/14/2019  - Continues Crestor without side effects. - Continue with diet improvement  Philemon Kingdom, MD PhD [facility]

## 2019-02-02 ENCOUNTER — Telehealth: Payer: Self-pay | Admitting: Internal Medicine

## 2019-02-02 ENCOUNTER — Encounter: Payer: Self-pay | Admitting: Internal Medicine

## 2019-02-02 ENCOUNTER — Other Ambulatory Visit: Payer: Self-pay | Admitting: Internal Medicine

## 2019-02-02 MED ORDER — EMPAGLIFLOZIN 25 MG PO TABS: 25.0000 mg | ORAL_TABLET | Freq: Every day | ORAL | 11 refills | Status: DC

## 2019-02-02 NOTE — Telephone Encounter (Signed)
Notified patient of message from Dr. Gherghe, patient expressed understanding and agreement. No further questions.  

## 2019-02-02 NOTE — Telephone Encounter (Signed)
°  Patient has some questions about her Invokana. She took her last one yesterday and she went to go pick this up today and it will be over $500. She would like to know what she should do.   Please advise

## 2019-02-02 NOTE — Telephone Encounter (Signed)
I sent Jardiance instead; if not affordable, then can change to Farxiga 10 mg

## 2019-03-04 ENCOUNTER — Other Ambulatory Visit: Payer: Self-pay | Admitting: Internal Medicine

## 2019-03-04 DIAGNOSIS — N182 Chronic kidney disease, stage 2 (mild): Secondary | ICD-10-CM

## 2019-03-04 DIAGNOSIS — E0822 Diabetes mellitus due to underlying condition with diabetic chronic kidney disease: Secondary | ICD-10-CM

## 2019-03-14 ENCOUNTER — Telehealth: Payer: Self-pay | Admitting: Family Medicine

## 2019-03-14 ENCOUNTER — Ambulatory Visit (INDEPENDENT_AMBULATORY_CARE_PROVIDER_SITE_OTHER): Payer: 59 | Admitting: Family Medicine

## 2019-03-14 ENCOUNTER — Ambulatory Visit: Payer: Self-pay | Admitting: *Deleted

## 2019-03-14 ENCOUNTER — Other Ambulatory Visit: Payer: Self-pay

## 2019-03-14 ENCOUNTER — Encounter: Payer: Self-pay | Admitting: Family Medicine

## 2019-03-14 ENCOUNTER — Encounter: Payer: Self-pay | Admitting: Internal Medicine

## 2019-03-14 DIAGNOSIS — E1165 Type 2 diabetes mellitus with hyperglycemia: Secondary | ICD-10-CM

## 2019-03-14 DIAGNOSIS — I10 Essential (primary) hypertension: Secondary | ICD-10-CM | POA: Diagnosis not present

## 2019-03-14 MED ORDER — LOSARTAN POTASSIUM 50 MG PO TABS: 50.0000 mg | ORAL_TABLET | Freq: Every day | ORAL | 3 refills | Status: DC

## 2019-03-14 NOTE — Telephone Encounter (Signed)
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 Please advise ASAP since the unit is going to start on Friday and probably stated pt that her employer is wanting her to start on Friday. Pt. Tel [phone].

## 2019-03-14 NOTE — Progress Notes (Signed)
Virtual Visit via Video Note  I connected with Angela Barr on 03/14/19 at  9:00 AM EDT by a video enabled telemedicine application and verified that I am speaking with the correct person using two identifiers.  Location: Patient: work Provider: home   I discussed the limitations of evaluation and management by telemedicine and the availability of in person appointments. The patient expressed understanding and agreed to proceed.  History of Present Illness: Pt is at work at [employer].  Pt c/o headache and elevated bp the last several days.  No cp, no sob,  No other complaints.     Observations/Objective: bp 151/101  157/100  This am 157/112  p 98  Wt 224  Ht 5 4 1/2  Pulse ox 98%  No cp, no sob Pt has had headache----  NAD    Past Medical History:  Diagnosis Date  . Arthritis    Hips   . Colon polyps 2008   [name]  . Diabetes mellitus 2006  . Hyperlipemia 2012  . Hypertension 2012   off meds at 01-08-18 [facility]   . Obesity    Current Outpatient Medications on File Prior to Visit  Medication Sig Dispense Refill  . cefdinir (OMNICEF) 300 MG capsule Take 1 capsule (300 mg total) by mouth 2 (two) times daily. 20 capsule 0  . empagliflozin (JARDIANCE) 25 MG TABS tablet Take 25 mg by mouth daily. 30 tablet 11  . fluconazole (DIFLUCAN) 150 MG tablet TAKE 1 TABLET BY MOUTH ONCE FOR 1 DOSE 2 tablet 1  . fluticasone (FLONASE) 50 MCG/ACT nasal spray Place 2 sprays into both nostrils daily. 16 g 6  . glimepiride (AMARYL) 2 MG tablet Take 1 tablet 2 times a day before breakfast and dinner. 90 tablet 3  . glucose blood (ONETOUCH VERIO) test strip Check your blood sugar twice daily 100 each 12  . loratadine (CLARITIN) 10 MG tablet Take 1 tablet (10 mg total) by mouth daily. 30 tablet 11  . metFORMIN (GLUCOPHAGE-XR) 500 MG 24 hr tablet TAKE 1 TABLET BY MOUTH EVERY MORNING AND 2 TABLETS WITH DINNER 270 tablet 0  . rosuvastatin (CRESTOR) 20 MG tablet Take 1 tablet (20 mg  total) by mouth at bedtime. 30 tablet 2  . Semaglutide,0.25 or 0.5MG /DOS, (OZEMPIC, 0.25 OR 0.5 MG/DOSE,) 2 MG/1.5ML SOPN Inject 0.5 mg into the skin once a week. 1 pen    No current facility-administered medications on file prior to visit.     Assessment and Plan: 1. Uncontrolled type 2 diabetes mellitus with hyperglycemia (HCC) Per endo  - Semaglutide,0.25 or 0.5MG /DOS, (OZEMPIC, 0.25 OR 0.5 MG/DOSE,) 2 MG/1.5ML SOPN; Inject 0.5 mg into the skin once a week.  Dispense: 1 pen  2. Essential hypertension New -- start losartan F/u 2 weeks or sooner prn -- will need to check bmp then as well - losartan (COZAAR) 50 MG tablet; Take 1 tablet (50 mg total) by mouth daily.  Dispense: 30 tablet; Refill: 3   Follow Up Instructions:    I discussed the assessment and treatment plan with the patient. The patient was provided an opportunity to ask questions and all were answered. The patient agreed with the plan and demonstrated an understanding of the instructions.   The patient was advised to call back or seek an in-person evaluation if the symptoms worsen or if the condition fails to improve as anticipated.  I provided 20 minutes of non-face-to-face time during this encounter.   [name], DO

## 2019-03-14 NOTE — Telephone Encounter (Signed)
Pt concerned,  b/p is elevated this morning. Her b/p 151/101 in the left arm and 146/94 in the right arm. And 153/112 in the left arm just now. She has a headache that started yesterday morning and today which prompted her to check her b/p. She stated that she is not on b/p medications and has not taking medication for about 7 years.  States her left eye feels full and strained. She denies fever, shortness of breath, weakness, diarrhea, loss of smell or taste. She has not been around anyone that she knows that has tested positive for the coronavirus. She wears a mask when she is out of her house. Advised her of having a virtual appointment with her pcp.  Also advised if she starts having chest pain, shortness of breath, weakness, difficulty walking, or talking and severe headache, she would need to be seen more urgently. Pt voice understanding.  Contacted [facility] at [facility] for an appointment. Call conference in with patient.  Reason for Disposition . [7] Systolic BP  >= 530 OR Diastolic >= 80 AND [0] not taking BP medications  Answer Assessment - Initial Assessment Questions 1. BLOOD PRESSURE: "What is the blood pressure?" "Did you take at least two measurements 5 minutes apart?"     151/101 in left arm and 146/94 right arm 2. ONSET: "When did you take your blood pressure?"     This morning 3. HOW: "How did you obtain the blood pressure?" (e.g., visiting nurse, automatic home BP monitor)     Automatic home BP monitor 4. HISTORY: "Do you have a history of high blood pressure?"     Yes in the past 5. MEDICATIONS: "Are you taking any medications for blood pressure?" "Have you missed any doses recently?"     Not taking any medications for b/p 6. OTHER SYMPTOMS: "Do you have any symptoms?" (e.g., headache, chest pain, blurred vision, difficulty breathing, weakness)     Headache 7. PREGNANCY: "Is there any chance you are pregnant?" "When was your last menstrual period?"     n/a  Protocols  used: HIGH BLOOD PRESSURE-A-AH

## 2019-03-14 NOTE — Telephone Encounter (Signed)
Virtual visit scheduled.  

## 2019-03-14 NOTE — Telephone Encounter (Signed)
Patient has type 2 diabetes and htn

## 2019-03-15 NOTE — Telephone Encounter (Signed)
Patient notified, she will call and talk with employer.

## 2019-03-15 NOTE — Telephone Encounter (Signed)
She is considered high risk----  If she is working directly with covid + pt I would say no---  If she is able to work on another unit with proper protection then ok

## 2019-03-16 ENCOUNTER — Telehealth: Payer: Self-pay | Admitting: Internal Medicine

## 2019-03-16 NOTE — Telephone Encounter (Signed)
I responded to her MyChart message, we do not have samples. Due to Covid we are not getting samples as often and we never know when we get them until they show up.

## 2019-03-16 NOTE — Telephone Encounter (Signed)
Please have her refer to her MyChart message.

## 2019-03-16 NOTE — Telephone Encounter (Signed)
Patient requests samples of Ozempic or Trulicity. Patient is out of the above mentioned medications. Patient states she has sent Dr. Cruzita Lederer a message on My Chart and has left a previous message by phone. Please call patient at ph# [phone] to advise.

## 2019-03-17 ENCOUNTER — Encounter: Payer: Self-pay | Admitting: Family Medicine

## 2019-03-18 NOTE — Telephone Encounter (Signed)
If she is having no pain I am not concerned but we can see her next week and check her urine

## 2019-03-29 ENCOUNTER — Other Ambulatory Visit: Payer: Self-pay

## 2019-03-29 ENCOUNTER — Ambulatory Visit (INDEPENDENT_AMBULATORY_CARE_PROVIDER_SITE_OTHER): Payer: 59 | Admitting: Family Medicine

## 2019-03-29 DIAGNOSIS — I10 Essential (primary) hypertension: Secondary | ICD-10-CM | POA: Diagnosis not present

## 2019-03-29 NOTE — Progress Notes (Signed)
Pre visit review using our clinic review tool, if applicable. No additional management support is needed unless otherwise documented below in the visit note.  Pt here for Blood pressure check per   Pt currently takes:losartan 50mg  once daily   Pt reports compliance with medication.No side effects of medication other than tingling in fingers in left hand  BP today @ =146/85 HR =80  Rechecked again after 10 minutes  BP=129/86 HR=81  Pt advised per Dr. Etter Sjogren, continue current medication and follow up with pcp in July.   BP Readings from Last 3 Encounters:  01/19/19 130/80  12/20/18 (!) 145/70  11/01/18 126/76

## 2019-03-29 NOTE — Progress Notes (Signed)
Noted  Teona Vargus R Lowne Chase, DO  

## 2019-03-31 ENCOUNTER — Encounter: Payer: Self-pay | Admitting: Internal Medicine

## 2019-04-04 ENCOUNTER — Ambulatory Visit (INDEPENDENT_AMBULATORY_CARE_PROVIDER_SITE_OTHER): Payer: 59 | Admitting: Family Medicine

## 2019-04-04 ENCOUNTER — Encounter: Payer: Self-pay | Admitting: Family Medicine

## 2019-04-04 ENCOUNTER — Other Ambulatory Visit: Payer: 59

## 2019-04-04 DIAGNOSIS — R300 Dysuria: Secondary | ICD-10-CM

## 2019-04-04 MED ORDER — CIPROFLOXACIN HCL 250 MG PO TABS: 250.0000 mg | ORAL_TABLET | Freq: Two times a day (BID) | ORAL | 0 refills | Status: DC
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
## 2019-04-06 ENCOUNTER — Encounter: Payer: Self-pay | Admitting: Family Medicine

## 2019-04-06 DIAGNOSIS — R300 Dysuria: Secondary | ICD-10-CM | POA: Insufficient documentation

## 2019-04-08 LAB — URINE CULTURE
MICRO NUMBER:: 546613
SPECIMEN QUALITY:: ADEQUATE

## 2019-04-15 ENCOUNTER — Other Ambulatory Visit: Payer: Self-pay | Admitting: Family Medicine

## 2019-04-19 NOTE — Progress Notes (Signed)
Virtual Visit via Video Note  I connected with Angela Barr on 04/19/19 at  2:30 PM EDT by a video enabled telemedicine application and verified that I am speaking with the correct person using two identifiers.  Location: Patient: home Provider: home   I discussed the limitations of evaluation and management by telemedicine and the availability of in person appointments. The patient expressed understanding and agreed to proceed.  History of Present Illness: Pt home c/o dysuria , frequency and believes she has a uti No fever No back pain No vag d/c    Observations/Objective: No vitals obtained Pt in NaD  Assessment and Plan: 1. Dysuria abx per orders Pt will come in for ua and culture  - ciprofloxacin (CIPRO) 250 MG tablet; Take 1 tablet (250 mg total) by mouth 2 (two) times daily.  Dispense: 6 tablet; Refill: 0 - Urine Culture; Future - Urine cytology ancillary only([place]); Future - Urinalysis; Future   Follow Up Instructions:    I discussed the assessment and treatment plan with the patient. The patient was provided an opportunity to ask questions and all were answered. The patient agreed with the plan and demonstrated an understanding of the instructions.   The patient was advised to call back or seek an in-person evaluation if the symptoms worsen or if the condition fails to improve as anticipated.  I provided 15 minutes of non-face-to-face time during this encounter.   [name], DO

## 2019-04-20 ENCOUNTER — Other Ambulatory Visit: Payer: Self-pay | Admitting: Family Medicine

## 2019-04-25 NOTE — Progress Notes (Signed)
There was no exam----  It was a doxy visit

## 2019-05-03 ENCOUNTER — Ambulatory Visit: Payer: 59 | Admitting: Family Medicine

## 2019-05-04 ENCOUNTER — Other Ambulatory Visit: Payer: Self-pay | Admitting: Internal Medicine

## 2019-05-08 ENCOUNTER — Encounter: Payer: Self-pay | Admitting: Family Medicine

## 2019-05-09 ENCOUNTER — Ambulatory Visit: Payer: 59 | Admitting: Family Medicine

## 2019-05-12 ENCOUNTER — Other Ambulatory Visit: Payer: Self-pay

## 2019-05-12 ENCOUNTER — Encounter: Payer: Self-pay | Admitting: Internal Medicine

## 2019-05-12 ENCOUNTER — Ambulatory Visit: Payer: 59 | Admitting: Internal Medicine

## 2019-05-12 VITALS — BP 140/80 | HR 107 | Ht 65.0 in | Wt 230.0 lb

## 2019-05-12 DIAGNOSIS — E669 Obesity, unspecified: Secondary | ICD-10-CM

## 2019-05-12 DIAGNOSIS — E785 Hyperlipidemia, unspecified: Secondary | ICD-10-CM

## 2019-05-12 DIAGNOSIS — E1165 Type 2 diabetes mellitus with hyperglycemia: Secondary | ICD-10-CM | POA: Diagnosis not present

## 2019-05-12 LAB — POCT GLYCOSYLATED HEMOGLOBIN (HGB A1C): Hemoglobin A1C: 6.7 % — AB (ref 4.0–5.6)

## 2019-05-12 MED ORDER — TRULICITY 1.5 MG/0.5ML ~~LOC~~ SOAJ: 1.5000 mg | SUBCUTANEOUS | 11 refills | Status: DC

## 2019-05-12 NOTE — Patient Instructions (Addendum)
Please continue: - Metformin XR 500 mg with lunch and 1000 mg with dinner - Jardiance 25 mg before breakfast - Glimepiride 2(-4 mg) twice a day before meals - Trulicity 1.5 mg weekly  Please return in 4 months with your sugar log.

## 2019-05-12 NOTE — Progress Notes (Signed)
Patient ID: Angela Barr, female   DOB: [birth year], 58 y.o.   MRN: [id number]  HPI: Angela Barr is a 58 y.o.-year-old female, returning for f/u for DM2, dx in 2007, non-insulin-dependent, uncontrolled, without long term complications, but with hyperglycemia. Last visit 4 months ago.  Unfortunately, since last visit she had to stop Ozempic due to the high price and the fact that we did not have any more samples to give her. Now restarted Trulicity 1 mo ago.  This was expensive, but after she exhausts her deductible, she would be able to afford it.  Last hemoglobin A1c was: Lab Results  Component Value Date   HGBA1C 7.2 (A) 01/19/2019   HGBA1C 6.6 (A) 09/17/2018   HGBA1C 6.4 (A) 05/14/2018  09/25/2015: HbA1c 7.1%  She is now on: - Metformin XR 500 mg with lunch and 1000 mg with dinner - Invokana 300 mg before breakfast - h/o yeast inf >> Jardiance 25 mg - Glimepiride 4 >> 2 mg twice a day before meals  >> Trulicity 1.5 mg weekly  - started 1 mo ago Stopped Trulicity 1.5 mg >> stopped b/c Nausea and HAs, and also expensive - now tolerating it well, with mild nausea  She was Bydureon >> lumps at inj. Site.  She was on Victoza, now too expensive.  Pt is checking sugars once a day: - am:   85 >> 147-190, 197 >> 115, 131-148, 158, 170 (watermelon) - 2h after b'fast: 130, 151 >> n/c >> 125-171, 197 >> n/c - before lunch: 104-112 >> 85-140 >> 141-173 >> 120s, 190 - 2h after lunch: 88 >> 105, 112 >> 144-220, 270 >> n/c - before dinner: 131 >> 86 >> 109 >> n/c >> 101, 167 >> 89-110 - 2h after dinner: 79, 193 >> n/c >> 167-227 >> 178 - bedtime:  n/c >> 156 >> 102 >> 149 >> n/c - nighttime: n/c >> 70s occas. >> 129 >> n/c Lowest sugar was 85 >> 101 >> 89; she has hypoglycemia awareness in the 90s. Highest sugar was 149 >> 270 >> 190.  Glucometer: One Touch Verio Flex   Eats sunflower seeds and nuts.  -+ CKD;  last BUN/creatinine:  Lab Results  Component Value Date   BUN 15  01/14/2019   CREATININE 0.61 01/14/2019   Lab Results  Component Value Date   MICRALBCREAT 0.6 05/14/2018   MICRALBCREAT 0.7 02/05/2016   MICRALBCREAT 0.7 12/25/2014   MICRALBCREAT 2.0 06/13/2014   MICRALBCREAT 0.7 04/04/2013   MICRALBCREAT 0.5 01/30/2012   MICRALBCREAT 0.3 06/17/2011  We stopped lisinopril as she had low blood pressure and dizziness in the past. ACR's >> not high in last 5 years. 24 h urine for proteins >> normal. Now on Losartan.  -+ HL; last set of lipids: Lab Results  Component Value Date   CHOL 142 01/14/2019   HDL 50.30 01/14/2019   LDLCALC 78 01/14/2019   TRIG 70.0 01/14/2019   CHOLHDL 3 01/14/2019  On Crestor 20. - last eye exam was in 2019: No DR -No numbness and tingling in her feet.  ROS: Constitutional: no weight gain/no weight loss, no fatigue, no subjective hyperthermia, no subjective hypothermia Eyes: no blurry vision, no xerophthalmia ENT: no sore throat, no nodules palpated in neck, no dysphagia, no odynophagia, no hoarseness Cardiovascular: no CP/no SOB/no palpitations/no leg swelling Respiratory: no cough/no SOB/no wheezing Gastrointestinal: + mild N right after taking Trulicity/no V/no D/no C/no acid reflux Musculoskeletal: no muscle aches/no joint aches Skin: no rashes, no hair loss Neurological:  no tremors/no numbness/no tingling/no dizziness  I reviewed pt's medications, allergies, PMH, social hx, family hx, and changes were documented in the history of present illness. Otherwise, unchanged from my initial visit note.  Past Medical History:  Diagnosis Date  . Arthritis    Hips   . Colon polyps 2008   [name]  . Diabetes mellitus 2006  . Hyperlipemia 2012  . Hypertension 2012   off meds at 01-08-18 PV   . Obesity    Past Surgical History:  Procedure Laterality Date  . BREAST BIOPSY  2003  . COLONOSCOPY    . POLYPECTOMY    . TUBAL LIGATION  1990  . VAGINAL HYSTERECTOMY  2006   History   Social History   . Marital Status: Married    Spouse Name: N/A  . Number of Children: 3   Occupational History  . RN    Social History Main Topics  . Smoking status: Never Smoker   . Smokeless tobacco: Never Used  . Alcohol Use: No  . Drug Use: No   Social History Narrative   3 caffeine drinks daily    Current Outpatient Medications on File Prior to Visit  Medication Sig Dispense Refill  . cefdinir (OMNICEF) 300 MG capsule Take 1 capsule (300 mg total) by mouth 2 (two) times daily. 20 capsule 0  . ciprofloxacin (CIPRO) 250 MG tablet Take 1 tablet (250 mg total) by mouth 2 (two) times daily. 6 tablet 0  . empagliflozin (JARDIANCE) 25 MG TABS tablet Take 25 mg by mouth daily. 30 tablet 11  . fluconazole (DIFLUCAN) 150 MG tablet TAKE 1 TABLET BY MOUTH ONCE FOR 1 DOSE 2 tablet 1  . fluticasone (FLONASE) 50 MCG/ACT nasal spray Place 2 sprays into both nostrils daily. 16 g 6  . glimepiride (AMARYL) 2 MG tablet Take 1 tablet 2 times a day before breakfast and dinner. 90 tablet 3  . loratadine (CLARITIN) 10 MG tablet Take 1 tablet (10 mg total) by mouth daily. 30 tablet 11  . losartan (COZAAR) 50 MG tablet Take 1 tablet (50 mg total) by mouth daily. 30 tablet 3  . metFORMIN (GLUCOPHAGE-XR) 500 MG 24 hr tablet TAKE 1 TABLET BY MOUTH EVERY MORNING AND 2 TABLETS WITH DINNER 270 tablet 0  . ONETOUCH VERIO test strip CHECK YOUR BLOOD SUGAR TWICE DAILY 100 strip 11  . rosuvastatin (CRESTOR) 20 MG tablet Take 1 tablet (20 mg total) by mouth at bedtime. 90 tablet 1  . Semaglutide,0.25 or 0.5MG /DOS, (OZEMPIC, 0.25 OR 0.5 MG/DOSE,) 2 MG/1.5ML SOPN Inject 0.5 mg into the skin once a week. 1 pen   . Vitamin D, Ergocalciferol, (DRISDOL) 1.25 MG (50000 UT) CAPS capsule TAKE ONE CAPSULE BY MOUTH EVERY 7 DAYS FOR 6 DOSES 6 capsule 4   No current facility-administered medications on file prior to visit.    Allergies  Allergen Reactions  . Bactrim Nausea Only  . Sulfa Antibiotics Nausea And Vomiting  .  Sulfamethoxazole-Trimethoprim    Family History  Problem Relation Age of Onset  . Heart failure Father   . Hypertension Father   . Colon cancer Father 67  . Diabetes Father   . Hypertension Mother   . Breast cancer Maternal Grandmother   . Diabetes Paternal Grandmother   . Colon polyps Neg Hx   . Esophageal cancer Neg Hx   . Rectal cancer Neg Hx   . Stomach cancer Neg Hx    PE: BP 140/80   Pulse (!) 107  Ht 5\' 5"  (1.651 m)   Wt 230 lb (104.3 kg)   SpO2 98%   BMI 38.27 kg/m  Body mass index is 38.27 kg/m. Wt Readings from Last 3 Encounters:  05/12/19 230 lb (104.3 kg)  01/19/19 227 lb (103 kg)  12/20/18 229 lb (103.9 kg)   Constitutional: overweight, in NAD Eyes: PERRLA, EOMI, no exophthalmos ENT: moist mucous membranes, no thyromegaly, no cervical lymphadenopathy Cardiovascular: tachycardia, RR, No MRG Respiratory: CTA B Gastrointestinal: abdomen soft, NT, ND, BS+ Musculoskeletal: no deformities, strength intact in all 4 Skin: moist, warm, no rashes Neurological: no tremor with outstretched hands, DTR normal in all 4  ASSESSMENT: 1. DM2, non-insulin-dependent, now better controlled, without long term complications, but with hyperglycemia  2. Obesity class 2 BMI Classification:  < 18.5 underweight   18.5-24.9 normal weight   25.0-29.9 overweight   30.0-34.9 class I obesity   35.0-39.9 class II obesity  ? 40.0 class III obesity   3. HL  PLAN:  1. Patient with longstanding, uncontrolled, type 2 diabetes, with improvement in blood sugar after adding SGLT2 inhibitor and GLP-1 receptor agonist.  She had problems with her insurance not covering GLP-1 receptor agonist and we will given her samples of Ozempic.  After we ran out of samples, we had to double up on the dose of glimepiride, however, a month ago, she was able to obtain Trulicity.  Her sugars improved after starting this.  She reduced the dose of glimepiride to the original 2 mg twice a day dose. -At  this visit, sugars are better at all times of the day with few hyperglycemic spikes especially after eating fruit.  We discussed about maybe using a higher dose of Amaryl (4 mg) before a larger meal or if she has desire to improve after the meeting. -Otherwise, we will not change her regimen.  She is having mild nausea after Trulicity but this is improving. - I recommended:  Patient Instructions  Please continue: - Metformin XR 500 mg with lunch and 1000 mg with dinner - Jardiance 25 mg before breakfast - Glimepiride 2(-4 mg) twice a day before meals - Trulicity 1.5 mg weekly  Please return in 4 months with your sugar log.   - we checked her HbA1c: 6.7% (improved) - advised to check sugars at different times of the day - 1x a day, rotating check times - advised for yearly eye exams >> she is UTD - return to clinic in 3-4 months     2. Obesity class 2 -She gained 3 pounds since last visit -Continue Invokana which should also help with weight loss -Unfortunately, she was off GLP-1 receptor agonist due to cost, but now restarted a month ago  3. HL - Reviewed latest lipid panel from 12/2018: All fractions at goal Lab Results  Component Value Date   CHOL 142 01/14/2019   HDL 50.30 01/14/2019   LDLCALC 78 01/14/2019   TRIG 70.0 01/14/2019   CHOLHDL 3 01/14/2019  - Continues Crestor without side effects.  Philemon Kingdom, MD PhD [facility]

## 2019-05-16 ENCOUNTER — Encounter: Payer: Self-pay | Admitting: Family Medicine

## 2019-05-16 ENCOUNTER — Ambulatory Visit: Payer: 59 | Admitting: Family Medicine

## 2019-05-16 ENCOUNTER — Other Ambulatory Visit: Payer: Self-pay

## 2019-05-16 DIAGNOSIS — I10 Essential (primary) hypertension: Secondary | ICD-10-CM | POA: Diagnosis not present

## 2019-05-16 DIAGNOSIS — E785 Hyperlipidemia, unspecified: Secondary | ICD-10-CM | POA: Diagnosis not present

## 2019-05-16 DIAGNOSIS — N760 Acute vaginitis: Secondary | ICD-10-CM

## 2019-05-16 MED ORDER — MOVE FREE JOINT HEALTH ADVANCE PO TABS: 1.0000 | ORAL_TABLET | Freq: Every day | ORAL | Status: DC

## 2019-05-16 MED ORDER — FLUCONAZOLE 150 MG PO TABS: ORAL_TABLET | ORAL | 1 refills | Status: DC

## 2019-05-16 NOTE — Assessment & Plan Note (Signed)
Probably secondary to jardiance  Working with endo on this  Refill diflucan

## 2019-05-16 NOTE — Patient Instructions (Signed)
Vaginitis Vaginitis is a condition in which the vaginal tissue swells and becomes red (inflamed). This condition is most often caused by a change in the normal balance of bacteria and yeast that live in the vagina. This change causes an overgrowth of certain bacteria or yeast, which causes the inflammation. There are different types of vaginitis, but the most common types are:  Bacterial vaginosis.  Yeast infection (candidiasis).  Trichomoniasis vaginitis. This is a sexually transmitted disease (STD).  Viral vaginitis.  Atrophic vaginitis.  Allergic vaginitis. What are the causes? The cause of this condition depends on the type of vaginitis. It can be caused by:  Bacteria (bacterial vaginosis).  Yeast, which is a fungus (yeast infection).  A parasite (trichomoniasis vaginitis).  A virus (viral vaginitis).  Low hormone levels (atrophic vaginitis). Low hormone levels can occur during pregnancy, breastfeeding, or after menopause.  Irritants, such as bubble baths, scented tampons, and feminine sprays (allergic vaginitis). Other factors can change the normal balance of the yeast and bacteria that live in the vagina. These include:  Antibiotic medicines.  Poor hygiene.  Diaphragms, vaginal sponges, spermicides, birth control pills, and intrauterine devices (IUD).  Sex.  Infection.  Uncontrolled diabetes.  A weakened defense (immune) system. What increases the risk? This condition is more likely to develop in women who:  Smoke.  Use vaginal douches, scented tampons, or scented sanitary pads.  Wear tight-fitting pants.  Wear thong underwear.  Use oral birth control pills or an IUD.  Have sex without a condom.  Have multiple sex partners.  Have an STD.  Frequently use the spermicide nonoxynol-9.  Eat lots of foods high in sugar.  Have uncontrolled diabetes.  Have low estrogen levels.  Have a weakened immune system from an immune disorder or medical  treatment.  Are pregnant or breastfeeding. What are the signs or symptoms? Symptoms vary depending on the cause of the vaginitis. Common symptoms include:  Abnormal vaginal discharge. ? The discharge is white, gray, or yellow with bacterial vaginosis. ? The discharge is thick, white, and cheesy with a yeast infection. ? The discharge is frothy and yellow or greenish with trichomoniasis.  A bad vaginal smell. The smell is fishy with bacterial vaginosis.  Vaginal itching, pain, or swelling.  Sex that is painful.  Pain or burning when urinating. Sometimes there are no symptoms. How is this diagnosed? This condition is diagnosed based on your symptoms and medical history. A physical exam, including a pelvic exam, will also be done. You may also have other tests, including:  Tests to determine the pH level (acidity or alkalinity) of your vagina.  A whiff test, to assess the odor that results when a sample of your vaginal discharge is mixed with a potassium hydroxide solution.  Tests of vaginal fluid. A sample will be examined under a microscope. How is this treated? Treatment varies depending on the type of vaginitis you have. Your treatment may include:  Antibiotic creams or pills to treat bacterial vaginosis and trichomoniasis.  Antifungal medicines, such as vaginal creams or suppositories, to treat a yeast infection.  Medicine to ease discomfort if you have viral vaginitis. Your sexual partner should also be treated.  Estrogen delivered in a cream, pill, suppository, or vaginal ring to treat atrophic vaginitis. If vaginal dryness occurs, lubricants and moisturizing creams may help. You may need to avoid scented soaps, sprays, or douches.  Stopping use of a product that is causing allergic vaginitis. Then using a vaginal cream to treat the symptoms. Follow   these instructions at home: Lifestyle  Keep your genital area clean and dry. Avoid soap, and only rinse the area with  water.  Do not douche or use tampons until your health care provider says it is okay to do so. Use sanitary pads, if needed.  Do not have sex until your health care provider approves. When you can return to sex, practice safe sex and use condoms.  Wipe from front to back. This avoids the spread of bacteria from the rectum to the vagina. General instructions  Take over-the-counter and prescription medicines only as told by your health care provider.  If you were prescribed an antibiotic medicine, take or use it as told by your health care provider. Do not stop taking or using the antibiotic even if you start to feel better.  Keep all follow-up visits as told by your health care provider. This is important. How is this prevented?  Use mild, non-scented products. Do not use things that can irritate the vagina, such as fabric softeners. Avoid the following products if they are scented: ? Feminine sprays. ? Detergents. ? Tampons. ? Feminine hygiene products. ? Soaps or bubble baths.  Let air reach your genital area. ? Wear cotton underwear to reduce moisture buildup. ? Avoid wearing underwear while you sleep. ? Avoid wearing tight pants and underwear or nylons without a cotton panel. ? Avoid wearing thong underwear.  Take off any wet clothing, such as bathing suits, as soon as possible.  Practice safe sex and use condoms. Contact a health care provider if:  You have abdominal pain.  You have a fever.  You have symptoms that last for more than 2-3 days. Get help right away if:  You have a fever and your symptoms suddenly get worse. Summary  Vaginitis is a condition in which the vaginal tissue becomes inflamed.This condition is most often caused by a change in the normal balance of bacteria and yeast that live in the vagina.  Treatment varies depending on the type of vaginitis you have.  Do not douche, use tampons , or have sex until your health care provider approves. When  you can return to sex, practice safe sex and use condoms. This information is not intended to replace advice given to you by your health care provider. Make sure you discuss any questions you have with your health care provider. Document Released: 08/10/2007 Document Revised: 09/25/2017 Document Reviewed: 11/18/2016 Elsevier Patient Education  2020 [employer]  
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
## 2019-05-16 NOTE — Assessment & Plan Note (Signed)
Encouraged heart healthy diet, increase exercise, avoid trans fats, consider a krill oil cap daily 

## 2019-05-16 NOTE — Assessment & Plan Note (Signed)
Well controlled, no changes to meds. Encouraged heart healthy diet such as the DASH diet and exercise as tolerated.  °

## 2019-05-26 ENCOUNTER — Encounter: Payer: Self-pay | Admitting: Family Medicine

## 2019-05-31 ENCOUNTER — Encounter: Payer: Self-pay | Admitting: Internal Medicine

## 2019-05-31 ENCOUNTER — Encounter: Payer: Self-pay | Admitting: Family Medicine

## 2019-06-01 NOTE — Telephone Encounter (Signed)
Please review pt concerns and advise

## 2019-06-02 ENCOUNTER — Other Ambulatory Visit: Payer: Self-pay

## 2019-06-03 ENCOUNTER — Ambulatory Visit: Payer: 59 | Admitting: Family Medicine

## 2019-06-03 ENCOUNTER — Encounter: Payer: Self-pay | Admitting: Family Medicine

## 2019-06-03 VITALS — BP 134/86 | HR 93 | Temp 98.1°F | Resp 18 | Ht 65.0 in | Wt 231.2 lb

## 2019-06-03 DIAGNOSIS — I10 Essential (primary) hypertension: Secondary | ICD-10-CM

## 2019-06-03 DIAGNOSIS — R002 Palpitations: Secondary | ICD-10-CM | POA: Diagnosis not present

## 2019-06-03 LAB — COMPREHENSIVE METABOLIC PANEL
ALT: 38 U/L — ABNORMAL HIGH (ref 0–35)
AST: 20 U/L (ref 0–37)
Albumin: 4.5 g/dL (ref 3.5–5.2)
Alkaline Phosphatase: 71 U/L (ref 39–117)
BUN: 15 mg/dL (ref 6–23)
CO2: 26 mEq/L (ref 19–32)
Calcium: 9.8 mg/dL (ref 8.4–10.5)
Chloride: 104 mEq/L (ref 96–112)
Creatinine, Ser: 0.54 mg/dL (ref 0.40–1.20)
GFR: 140.95 mL/min (ref >60.00)
Glucose, Bld: 70 mg/dL (ref 70–99)
Potassium: 3.9 mEq/L (ref 3.5–5.1)
Sodium: 139 mEq/L (ref 135–145)
Total Bilirubin: 0.2 mg/dL (ref 0.2–1.2)
Total Protein: 7 g/dL (ref 6.0–8.3)

## 2019-06-03 LAB — CBC WITH DIFFERENTIAL/PLATELET
Basophils Absolute: 0.1 10*3/uL (ref 0.0–0.1)
Basophils Relative: 1.1 % (ref 0.0–3.0)
Eosinophils Absolute: 0.1 10*3/uL (ref 0.0–0.7)
Eosinophils Relative: 2 % (ref 0.0–5.0)
HCT: 43.6 % (ref 36.0–46.0)
Hemoglobin: 13.8 g/dL (ref 12.0–15.0)
Lymphocytes Relative: 38.4 % (ref 12.0–46.0)
Lymphs Abs: 2.2 10*3/uL (ref 0.7–4.0)
MCHC: 31.7 g/dL (ref 30.0–36.0)
MCV: 80.6 fl (ref 78.0–100.0)
Monocytes Absolute: 0.5 10*3/uL (ref 0.1–1.0)
Monocytes Relative: 7.8 % (ref 3.0–12.0)
Neutro Abs: 2.9 10*3/uL (ref 1.4–7.7)
Neutrophils Relative %: 50.7 % (ref 43.0–77.0)
Platelets: 215 10*3/uL (ref 150.0–400.0)
RBC: 5.41 Mil/uL — ABNORMAL HIGH (ref 3.87–5.11)
RDW: 15.8 % — ABNORMAL HIGH (ref 11.5–15.5)
WBC: 5.8 10*3/uL (ref 4.0–10.5)

## 2019-06-03 MED ORDER — LOSARTAN POTASSIUM 100 MG PO TABS: 100.0000 mg | ORAL_TABLET | Freq: Every day | ORAL | 3 refills | Status: DC
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
## 2019-06-03 NOTE — Patient Instructions (Signed)

## 2019-06-04 LAB — THYROID PANEL WITH TSH
Free Thyroxine Index: 2.2 (ref 1.4–3.8)
T3 Uptake: 28 % (ref 22–35)
T4, Total: 7.9 ug/dL (ref 5.1–11.9)
TSH: 0.56 mIU/L (ref 0.40–4.50)

## 2019-06-04 NOTE — Assessment & Plan Note (Signed)
Poorly controlled will alter medications, encouraged DASH diet, minimize caffeine and obtain adequate sleep. Report concerning symptoms and follow up as directed and as needed 

## 2019-06-07 ENCOUNTER — Telehealth: Payer: Self-pay | Admitting: Radiology

## 2019-06-07 NOTE — Telephone Encounter (Signed)
Enrolled patient for a 30 Day [employer] Event monitor to be mailed. Brief instructions were gone over with the patient and she knows to expect the monitor to arrive in 3-4 days.

## 2019-06-08 ENCOUNTER — Encounter: Payer: Self-pay | Admitting: *Deleted

## 2019-06-08 ENCOUNTER — Encounter: Payer: Self-pay | Admitting: Neurology

## 2019-06-08 ENCOUNTER — Other Ambulatory Visit: Payer: Self-pay | Admitting: Internal Medicine

## 2019-06-08 ENCOUNTER — Telehealth: Payer: Self-pay | Admitting: Neurology

## 2019-06-08 ENCOUNTER — Other Ambulatory Visit: Payer: Self-pay

## 2019-06-08 ENCOUNTER — Ambulatory Visit: Payer: 59 | Admitting: Neurology

## 2019-06-08 VITALS — BP 144/92 | HR 81 | Temp 97.1°F | Ht 65.0 in | Wt 231.0 lb

## 2019-06-08 DIAGNOSIS — H8110 Benign paroxysmal vertigo, unspecified ear: Secondary | ICD-10-CM | POA: Diagnosis not present

## 2019-06-08 DIAGNOSIS — N182 Chronic kidney disease, stage 2 (mild): Secondary | ICD-10-CM

## 2019-06-08 DIAGNOSIS — H938X9 Other specified disorders of ear, unspecified ear: Secondary | ICD-10-CM

## 2019-06-08 DIAGNOSIS — R269 Unspecified abnormalities of gait and mobility: Secondary | ICD-10-CM

## 2019-06-08 DIAGNOSIS — H81399 Other peripheral vertigo, unspecified ear: Secondary | ICD-10-CM | POA: Diagnosis not present

## 2019-06-08 DIAGNOSIS — E669 Obesity, unspecified: Secondary | ICD-10-CM | POA: Diagnosis not present

## 2019-06-08 DIAGNOSIS — E0822 Diabetes mellitus due to underlying condition with diabetic chronic kidney disease: Secondary | ICD-10-CM

## 2019-06-08 DIAGNOSIS — H9190 Unspecified hearing loss, unspecified ear: Secondary | ICD-10-CM

## 2019-06-08 DIAGNOSIS — R42 Dizziness and giddiness: Secondary | ICD-10-CM

## 2019-06-08 DIAGNOSIS — Z6834 Body mass index (BMI) 34.0-34.9, adult: Secondary | ICD-10-CM

## 2019-06-08 NOTE — Patient Instructions (Signed)
Vestibular therapy MRI brain [facility]   Benign Positional Vertigo Vertigo is the feeling that you or your surroundings are moving when they are not. Benign positional vertigo is the most common form of vertigo. This is usually a harmless condition (benign). This condition is positional. This means that symptoms are triggered by certain movements and positions. This condition can be dangerous if it occurs while you are doing something that could cause harm to you or others. This includes activities such as driving or operating machinery. What are the causes? In many cases, the cause of this condition is not known. It may be caused by a disturbance in an area of the inner ear that helps your brain to sense movement and balance. This disturbance can be caused by:  Viral infection (labyrinthitis).  Head injury.  Repetitive motion, such as jumping, dancing, or running. What increases the risk? You are more likely to develop this condition if:  You are a woman.  You are 57 years of age or older. What are the signs or symptoms? Symptoms of this condition usually happen when you move your head or your eyes in different directions. Symptoms may start suddenly, and usually last for less than a minute. They include:  Loss of balance and falling.  Feeling like you are spinning or moving.  Feeling like your surroundings are spinning or moving.  Nausea and vomiting.  Blurred vision.  Dizziness.  Involuntary eye movement (nystagmus). Symptoms can be mild and cause only minor problems, or they can be severe and interfere with daily life. Episodes of benign positional vertigo may return (recur) over time. Symptoms may improve over time. How is this diagnosed? This condition may be diagnosed based on:  Your medical history.  Physical exam of the head, neck, and ears.  Tests, such as: ? MRI. ? CT scan. ? Eye movement tests. Your health care provider may ask you  to change positions quickly while he or she watches you for symptoms of benign positional vertigo, such as nystagmus. Eye movement may be tested with a variety of exams that are designed to evaluate or stimulate vertigo. ? An electroencephalogram (EEG). This records electrical activity in your brain. ? Hearing tests. You may be referred to a health care provider who specializes in ear, nose, and throat (ENT) problems (otolaryngologist) or a provider who specializes in disorders of the nervous system (neurologist). How is this treated?  This condition may be treated in a session in which your health care provider moves your head in specific positions to adjust your inner ear back to normal. Treatment for this condition may take several sessions. Surgery may be needed in severe cases, but this is rare. In some cases, benign positional vertigo may resolve on its own in 2-4 weeks. Follow these instructions at home: Safety  Move slowly. Avoid sudden body or head movements or certain positions, as told by your health care provider.  Avoid driving until your health care provider says it is safe for you to do so.  Avoid operating heavy machinery until your health care provider says it is safe for you to do so.  Avoid doing any tasks that would be dangerous to you or others if vertigo occurs.  If you have trouble walking or keeping your balance, try using a cane for stability. If you feel dizzy or unstable, sit down right away.  Return to your normal activities as told by your health care provider. Ask your health care provider  what activities are safe for you. General instructions  Take over-the-counter and prescription medicines only as told by your health care provider.  Drink enough fluid to keep your urine pale yellow.  Keep all follow-up visits as told by your health care provider. This is important. Contact a health care provider if:  You have a fever.  Your condition gets worse or  you develop new symptoms.  Your family or friends notice any behavioral changes.  You have nausea or vomiting that gets worse.  You have numbness or a "pins and needles" sensation. Get help right away if you:  Have difficulty speaking or moving.  Are always dizzy.  Faint.  Develop severe headaches.  Have weakness in your legs or arms.  Have changes in your hearing or vision.  Develop a stiff neck.  Develop sensitivity to light. Summary  Vertigo is the feeling that you or your surroundings are moving when they are not. Benign positional vertigo is the most common form of vertigo.  The cause of this condition is not known. It may be caused by a disturbance in an area of the inner ear that helps your brain to sense movement and balance.  Symptoms include loss of balance and falling, feeling that you or your surroundings are moving, nausea and vomiting, and blurred vision.  This condition can be diagnosed based on symptoms, physical exam, and other tests, such as MRI, CT scan, eye movement tests, and hearing tests.  Follow safety instructions as told by your health care provider. You will also be told when to contact your health care provider in case of problems. This information is not intended to replace advice given to you by your health care provider. Make sure you discuss any questions you have with your health care provider. Document Released: 07/21/2006 Document Revised: 03/24/2018 Document Reviewed: 03/24/2018 Elsevier Patient Education  2020 [employer].

## 2019-06-08 NOTE — Telephone Encounter (Signed)
[employer] pending faxed notes 

## 2019-06-08 NOTE — Progress Notes (Signed)
[facility]    Provider:  Dr Jaynee Eagles Requesting Provider: Hessie Knows, MD Primary Care Provider:  Carollee Herter, Alferd Apa, DO  CC:  vertigo  HPI:  Angela Barr is a 58 y.o. female here as requested by Dr. Redmond Baseman for dizziness. PMHx diabetes, HLD, remote migraines none in years and never associated with dizziness/vertigo. She ad vertigo 13 years ago but resolved. Restarted this year, sporadic, she can be sitting still and if she turns it is quick rooming brief, brief, resolves with stopping moving her head, she is taking Trulicity and she has been getting nauseated and that was stopped and she is on another medication. She has pressure in her ears, feels like underwater and hearing problems. She was tried on flonase and claritin and did not help. Stopping helps. She breaks out into sweat. She is working with Dr. Cheri Rous and she is going to wear a heart monitor and she is having a cardiac workup and her blood presure meds have been adjusted lots of things going on she thinks it may be anxiety. Ear popping is daily, vertigo 3 episodes last month and not one this month. No other focal neurologic deficits, associated symptoms, inciting events or modifiable factors.  Reviewed notes, labs and imaging from outside physicians, which showed:  Patient was asked to see [place] by Dr. Hessie Knows. She was seen for dizziness. She has recurrent vertigo. Episodes last for a few minutes. She was treated with antibiotics without improvement. There is associated sweating but no nausea. She also has fullness in her ears. She has popping in her ears. Aggravated on turning her head, had similar vertigo 13 years ago. Hearing is normal.   TSh normal. Cbc unremarkable, CMP  Review of Systems: Patient complains of symptoms per HPI as well as the following symptoms: vertigo. Pertinent negatives and positives per HPI. All others negative.   Social History   Socioeconomic History  . Marital  status: Married    Spouse name: Not on file  . Number of children: 3  . Years of education: Not on file  . Highest education level: Not on file  Occupational History  . Occupation: Therapist, sports  Social Needs  . Financial resource strain: Not on file  . Food insecurity    Worry: Not on file    Inability: Not on file  . Transportation needs    Medical: Not on file    Non-medical: Not on file  Tobacco Use  . Smoking status: Never Smoker  . Smokeless tobacco: Never Used  Substance and Sexual Activity  . Alcohol use: No  . Drug use: No  . Sexual activity: Not on file  Lifestyle  . Physical activity    Days per week: Not on file    Minutes per session: Not on file  . Stress: Not on file  Relationships  . Social Herbalist on phone: Not on file    Gets together: Not on file    Attends religious service: Not on file    Active member of club or organization: Not on file    Attends meetings of clubs or organizations: Not on file    Relationship status: Not on file  . Intimate partner violence    Fear of current or ex partner: Not on file    Emotionally abused: Not on file    Physically abused: Not on file    Forced sexual activity: Not on file  Other Topics Concern  . Not  on file  Social History Narrative   3 caffeine drinks daily.   Exercise-no   Lives at home with husband     Family History  Problem Relation Age of Onset  . Heart failure Father   . Hypertension Father   . Colon cancer Father 46  . Diabetes Father   . Hypertension Mother   . Breast cancer Maternal Grandmother   . Diabetes Paternal Grandmother   . Colon polyps Neg Hx   . Esophageal cancer Neg Hx   . Rectal cancer Neg Hx   . Stomach cancer Neg Hx     Past Medical History:  Diagnosis Date  . Arthritis    Hips   . Colon polyps 2008   [name]  . Diabetes mellitus 2006  . Hyperlipemia 2012  . Hypertension 2012   off meds at 01-08-18 PV   . Obesity     Patient Active Problem  List   Diagnosis Date Noted  . Dysuria 04/06/2019  . Dizzy 11/01/2018  . Displaced fracture of fifth metatarsal bone of right foot 03/05/2018  . Nasal sore 02/24/2017  . Type 2 diabetes mellitus with hyperglycemia, without long-term current use of insulin ([place]) 03/20/2016  . Norovirus 11/04/2015  . Cellulitis and abscess of trunk 05/10/2015  . Vaginitis and vulvovaginitis 03/19/2015  . Serous otitis media 06/30/2013  . Class 2 obesity 06/25/2013  . Hyperlipidemia 06/25/2013  . Hemorrhage of rectum and anus 07/04/2011  . Personal history of colonic polyps 07/04/2011  . Family history of malignant neoplasm of gastrointestinal tract 07/04/2011  . HTN (hypertension) 06/17/2011    Past Surgical History:  Procedure Laterality Date  . BREAST BIOPSY  2003  . COLONOSCOPY    . POLYPECTOMY    . TUBAL LIGATION  1990  . VAGINAL HYSTERECTOMY  2006  . WISDOM TOOTH EXTRACTION      Current Outpatient Medications  Medication Sig Dispense Refill  . empagliflozin (JARDIANCE) 25 MG TABS tablet Take 25 mg by mouth daily. 30 tablet 11  . glimepiride (AMARYL) 2 MG tablet Take 1 tablet 2 times a day before breakfast and dinner. 90 tablet 3  . Glucos-Chond-Hyal Ac-Ca Fructo (MOVE FREE JOINT HEALTH ADVANCE) TABS Take 1 tablet by mouth daily.    Marland Kitchen losartan (COZAAR) 100 MG tablet Take 1 tablet (100 mg total) by mouth daily. 90 tablet 3  . metFORMIN (GLUCOPHAGE-XR) 500 MG 24 hr tablet TAKE 1 TABLET BY MOUTH EVERY MORNING AND 2 TABLETS WITH DINNER 270 tablet 0  . OZEMPIC, 0.25 OR 0.5 MG/DOSE, 2 MG/1.5ML SOPN     . rosuvastatin (CRESTOR) 20 MG tablet Take 1 tablet (20 mg total) by mouth at bedtime. 90 tablet 1  . Vitamin D, Ergocalciferol, (DRISDOL) 1.25 MG (50000 UT) CAPS capsule TAKE ONE CAPSULE BY MOUTH EVERY 7 DAYS FOR 6 DOSES 6 capsule 4  . fluconazole (DIFLUCAN) 150 MG tablet TAKE 1 TABLET BY MOUTH ONCE FOR 1 DOSE (Patient not taking: Reported on 06/08/2019) 2 tablet 1  . fluticasone (FLONASE) 50  MCG/ACT nasal spray Place 2 sprays into both nostrils daily. (Patient not taking: Reported on 06/08/2019) 16 g 6  . loratadine (CLARITIN) 10 MG tablet Take 1 tablet (10 mg total) by mouth daily. (Patient not taking: Reported on 06/08/2019) 30 tablet 11  . ONETOUCH VERIO test strip CHECK YOUR BLOOD SUGAR TWICE DAILY 100 strip 11   No current facility-administered medications for this visit.     Allergies as of 06/08/2019 - Review Complete 06/08/2019  Allergen Reaction Noted  . Bactrim Nausea Only 06/17/2011  . Sulfa antibiotics Nausea And Vomiting 03/02/2018  . Sulfamethoxazole-trimethoprim  07/04/2015    Vitals: BP (!) 144/92 (BP Location: Right Arm, Patient Position: Sitting)   Pulse 81   Temp (!) 97.1 F (36.2 C) Comment: taken by check-in staff  Ht 5\' 5"  (1.651 m)   Wt 231 lb (104.8 kg)   BMI 38.44 kg/m  Last Weight:  Wt Readings from Last 1 Encounters:  06/08/19 231 lb (104.8 kg)   Last Height:   Ht Readings from Last 1 Encounters:  06/08/19 5\' 5"  (1.651 m)     Physical exam: Exam: Gen: NAD, conversant, well nourised, obese, well groomed                     CV: RRR, no MRG. No Carotid Bruits. No peripheral edema, warm, nontender Eyes: Conjunctivae clear without exudates or hemorrhage  Neuro: Detailed Neurologic Exam  Speech:    Speech is normal; fluent and spontaneous with normal comprehension.  Cognition:    The patient is oriented to person, place, and time;     recent and remote memory intact;     language fluent;     normal attention, concentration,     fund of knowledge Cranial Nerves:    The pupils are equal, round, and reactive to light. The fundi are normal and spontaneous venous pulsations are present. Visual fields are full to finger confrontation. Extraocular movements are intact. Trigeminal sensation is intact and the muscles of mastication are normal. The face is symmetric. The palate elevates in the midline. Hearing intact. Voice is normal. Shoulder  shrug is normal. The tongue has normal motion without fasciculations.   Coordination:    Normal finger to nose and heel to shin. Normal rapid alternating movements.   Gait:    Heel-toe and tandem gait are normal.   Motor Observation:    No asymmetry, no atrophy, and no involuntary movements noted. Tone:    Normal muscle tone.    Posture:    Posture is normal. normal erect    Strength:    Strength is V/V in the upper and lower limbs.      Sensation: intact to LT     Reflex Exam:  DTR's:    Deep tendon reflexes in the upper and lower extremities are normal bilaterally.   Toes:    The toes are downgoing bilaterally.   Clonus:    Clonus is absent.    Assessment/Plan:  58 y.o. female here as requested by Dr. Redmond Baseman for dizziness. PMHx diabetes, HLD, remote migraines none in years and never associated with dizziness/vertigo. Likely BPPV but needs to have a thorough evaluation, not vestibular migraines.   MRI brain due to concerning symptoms of vertigo and hearing changes to look for cranial nerve enhancement, schwannoma, space occupying mass, chiari or intracranial hypertension (pseudotumor).   Orders Placed This Encounter  Procedures  . MR BRAIN W WO CONTRAST  . Ambulatory referral to Physical Therapy  . Ambulatory referral to Family Practice    Cc: Carollee Herter, Alferd Apa, *,  Dr. Marcha Solders, [facility] [address] [place], [place] [address]  Phone [phone] Fax [phone]

## 2019-06-13 NOTE — Telephone Encounter (Signed)
no to the covid-19 questions MR Brain w/wo contrast Dr. Jaynee Eagles [facility] Auth: [id number] (exp. 06/08/19 to 07/23/19). Patient is scheduled at [facility] for 819/20.

## 2019-06-15 ENCOUNTER — Ambulatory Visit: Payer: 59

## 2019-06-15 ENCOUNTER — Ambulatory Visit (INDEPENDENT_AMBULATORY_CARE_PROVIDER_SITE_OTHER): Payer: 59

## 2019-06-15 ENCOUNTER — Other Ambulatory Visit: Payer: Self-pay

## 2019-06-15 DIAGNOSIS — R002 Palpitations: Secondary | ICD-10-CM | POA: Diagnosis not present

## 2019-06-15 DIAGNOSIS — H9190 Unspecified hearing loss, unspecified ear: Secondary | ICD-10-CM | POA: Diagnosis not present

## 2019-06-15 DIAGNOSIS — H938X9 Other specified disorders of ear, unspecified ear: Secondary | ICD-10-CM | POA: Diagnosis not present

## 2019-06-15 DIAGNOSIS — H81399 Other peripheral vertigo, unspecified ear: Secondary | ICD-10-CM

## 2019-06-15 DIAGNOSIS — I10 Essential (primary) hypertension: Secondary | ICD-10-CM

## 2019-06-15 DIAGNOSIS — R269 Unspecified abnormalities of gait and mobility: Secondary | ICD-10-CM

## 2019-06-15 DIAGNOSIS — H8110 Benign paroxysmal vertigo, unspecified ear: Secondary | ICD-10-CM

## 2019-06-15 DIAGNOSIS — R42 Dizziness and giddiness: Secondary | ICD-10-CM

## 2019-06-15 MED ORDER — GADOBENATE DIMEGLUMINE 529 MG/ML IV SOLN
20.0000 mL | Freq: Once | INTRAVENOUS | Status: AC | PRN
  Administered 2019-06-15: 20 mL via INTRAVENOUS

## 2019-06-24 ENCOUNTER — Other Ambulatory Visit: Payer: Self-pay | Admitting: Internal Medicine

## 2019-06-24 ENCOUNTER — Ambulatory Visit: Payer: 59

## 2019-06-24 ENCOUNTER — Telehealth: Payer: Self-pay | Admitting: Internal Medicine

## 2019-06-24 NOTE — Telephone Encounter (Signed)
Pt called needing a refill for glimepiride (AMARYL) 2 MG tablet because someday's he takes two pills at dinner. Quantity is 30 days she's running out before the end of the month. Please advise? Pt has 4 left.  Pharmacy is [facility] [id number] - [place], [place] AT [address]  Call pt @ [phone]. Please and Thank you!

## 2019-06-24 NOTE — Telephone Encounter (Signed)
OK to send.

## 2019-06-27 ENCOUNTER — Other Ambulatory Visit: Payer: Self-pay

## 2019-06-27 MED ORDER — GLIMEPIRIDE 2 MG PO TABS: ORAL_TABLET | ORAL | 3 refills | Status: DC

## 2019-06-27 NOTE — Telephone Encounter (Signed)
I have sent rx for patient to pick up at [facility].

## 2019-07-19 ENCOUNTER — Other Ambulatory Visit: Payer: Self-pay

## 2019-07-20 ENCOUNTER — Encounter: Payer: Self-pay | Admitting: Family Medicine

## 2019-07-22 ENCOUNTER — Ambulatory Visit: Payer: 59 | Admitting: Family Medicine

## 2019-07-22 ENCOUNTER — Encounter: Payer: Self-pay | Admitting: Family Medicine

## 2019-07-22 ENCOUNTER — Other Ambulatory Visit: Payer: Self-pay

## 2019-07-22 VITALS — BP 118/68 | HR 97 | Temp 96.9°F | Ht 65.0 in | Wt 230.2 lb

## 2019-07-22 DIAGNOSIS — R002 Palpitations: Secondary | ICD-10-CM

## 2019-07-22 DIAGNOSIS — I10 Essential (primary) hypertension: Secondary | ICD-10-CM | POA: Diagnosis not present

## 2019-07-22 NOTE — Progress Notes (Signed)
Chief Complaint  Patient presents with  . Results    heart monitor    Subjective Angela Barr is a 58 y.o. female who presents for hypertension follow up. She does monitor home blood pressures. Blood pressures ranging from 120-130's/70-80's on average. She is compliant with medication-losartan 100 mg daily. Patient has these side effects of medication: none She is adhering to a healthy diet overall. Current exercise: some walking  She recently had a Holter monitor that showed normal sinus rhythm.  Brief episodes of sinus tachycardia.  Her palpitations have gotten better.   Past Medical History:  Diagnosis Date  . Arthritis    Hips   . Colon polyps 2008   [name]  . Diabetes mellitus 2006  . Hyperlipemia 2012  . Hypertension 2012   off meds at 01-08-18 [facility]   . Obesity     Review of Systems Cardiovascular: no chest pain Respiratory:  no shortness of breath  Exam BP 118/68 (BP Location: Left Arm, Patient Position: Sitting, Cuff Size: Large)   Pulse 97   Temp (!) 96.9 F (36.1 C) (Temporal)   Ht 5\' 5"  (1.651 m)   Wt 230 lb 4 oz (104.4 kg)   SpO2 96%   BMI 38.32 kg/m  General:  well developed, well nourished, in no apparent distress Heart: RRR, no bruits, no LE edema Lungs: clear to auscultation, no accessory muscle use Psych: well oriented with normal range of affect and appropriate judgment/insight  Essential hypertension  Palpitations  1- cont ARB. Counseled on diet and exercise. 2-we discussed adding a beta-blocker for symptomatic control of palpitations.  Because she is on Amaryl, decided to hold off as her palpitations are improving. F/u as originally scheduled with her regular PCP. The patient voiced understanding and agreement to the plan.  [place], DO 07/22/19  4:35 PM

## 2019-07-22 NOTE — Patient Instructions (Signed)
Your blood pressure looks good. Check at home 1-2 times per week.   Keep the diet clean and stay active.  You do not need any extra medication at this time.  Mind caffeine intake.  Let us know if you need anything.

## 2019-07-25 ENCOUNTER — Ambulatory Visit: Payer: 59 | Admitting: Family Medicine

## 2019-08-18 ENCOUNTER — Encounter: Payer: Self-pay | Admitting: Internal Medicine

## 2019-08-19 ENCOUNTER — Other Ambulatory Visit: Payer: Self-pay | Admitting: Internal Medicine

## 2019-08-19 MED ORDER — OZEMPIC (0.25 OR 0.5 MG/DOSE) 2 MG/1.5ML ~~LOC~~ SOPN: 0.5000 mg | PEN_INJECTOR | SUBCUTANEOUS | 3 refills | Status: DC

## 2019-08-22 ENCOUNTER — Telehealth: Payer: Self-pay

## 2019-08-22 ENCOUNTER — Encounter: Payer: Self-pay | Admitting: Internal Medicine

## 2019-08-22 NOTE — Telephone Encounter (Signed)
Prior authorization for Ozempic has been approved by patient's insurance.  Coverage is effective until 08/21/2020   Approval letter has been sent to scanning.

## 2019-09-07 ENCOUNTER — Other Ambulatory Visit: Payer: Self-pay

## 2019-09-07 DIAGNOSIS — E0822 Diabetes mellitus due to underlying condition with diabetic chronic kidney disease: Secondary | ICD-10-CM

## 2019-09-07 MED ORDER — METFORMIN HCL ER 500 MG PO TB24: ORAL_TABLET | ORAL | 0 refills | Status: DC

## 2019-09-08 ENCOUNTER — Other Ambulatory Visit: Payer: Self-pay

## 2019-09-12 ENCOUNTER — Encounter: Payer: Self-pay | Admitting: Internal Medicine

## 2019-09-12 ENCOUNTER — Ambulatory Visit (INDEPENDENT_AMBULATORY_CARE_PROVIDER_SITE_OTHER): Payer: 59 | Admitting: Internal Medicine

## 2019-09-12 VITALS — BP 120/60 | HR 92 | Ht 65.0 in | Wt 231.0 lb

## 2019-09-12 DIAGNOSIS — E785 Hyperlipidemia, unspecified: Secondary | ICD-10-CM

## 2019-09-12 DIAGNOSIS — E1165 Type 2 diabetes mellitus with hyperglycemia: Secondary | ICD-10-CM | POA: Diagnosis not present

## 2019-09-12 DIAGNOSIS — E669 Obesity, unspecified: Secondary | ICD-10-CM | POA: Diagnosis not present

## 2019-09-12 LAB — POCT GLYCOSYLATED HEMOGLOBIN (HGB A1C): Hemoglobin A1C: 6.4 % — AB (ref 4.0–5.6)

## 2019-09-12 MED ORDER — OZEMPIC (0.25 OR 0.5 MG/DOSE) 2 MG/1.5ML ~~LOC~~ SOPN: 0.5000 mg | PEN_INJECTOR | SUBCUTANEOUS | 3 refills | Status: DC

## 2019-09-12 NOTE — Addendum Note (Signed)
Addended by: Cardell Peach I on: 09/12/2019 10:45 AM   Modules accepted: Orders

## 2019-09-12 NOTE — Patient Instructions (Signed)
Please continue: - Metformin XR 500 mg with lunch and 1000 mg with dinner - Jardiance 25 mg before breakfast - Glimepiride 2 mg 2x a day before larger meals - Ozempic 0.5 mg weekly  Please return in 4 months with your sugar log.

## 2019-09-12 NOTE — Progress Notes (Signed)
Patient ID: Angela Barr, female   DOB: 19-Jul-1961, 58 y.o.   MRN: [id number]  HPI: Angela Barr is a 58 y.o.-year-old female, returning for f/u for DM2, dx in 2007, non-insulin-dependent, uncontrolled, without long term complications, but with hyperglycemia. Last visit 4 months ago.  Reviewed HbA1c levels: Lab Results  Component Value Date   HGBA1C 6.7 (A) 05/12/2019   HGBA1C 7.2 (A) 01/19/2019   HGBA1C 6.6 (A) 09/17/2018  09/25/2015: HbA1c 7.1%  She is now on: - Metformin XR 500 mg with lunch and 1000 mg with dinner - Invokana 300 mg before breakfast - h/o yeast inf >> Jardiance 25 mg in a.m. - Glimepiride 2 to 4 mg 2x a day before meals  >> Trulicity 1.5 mg weekly  - nausea >> Ozempic 0.5 mg weekly Stopped Trulicity 1.5 mg >> stopped b/c Nausea and HAs, and also expensive - now tolerating it well, with mild nausea  She was Bydureon >> lumps at inj. Site.  She was on Victoza, now too expensive.  Pt is checking sugars 0-1x a day: - am:  147-190, 197 >> 115, 131-148, 158, 170 (watermelon) >> 113-121 - 2h after b'fast: 130, 151 >> n/c >> 125-171, 197 >> n/c >> 140, 151, 185  - before lunch: 104-112 >> 85-140 >> 141-173 >> 120s, 190 >> n/c - 2h after lunch: 88 >> 105, 112 >> 144-220, 270 >> n/c - before dinner: 131 >> 86 >> 109 >> n/c >> 101, 167 >> 89-110 >> 116 - 2h after dinner: 79, 193 >> n/c >> 167-227 >> 178 >> n/c - bedtime:  n/c >> 156 >> 102 >> 149 >> n/c - nighttime: n/c >> 70s occas. >> 129 >> n/c Lowest sugar was 85 >> 101 >> 89 >> 113; she has hypoglycemia awareness in the 90s. Highest sugar was 149 >> 270 >> 190 >> 185.  Glucometer: One Touch Verio Flex   Eats sunflower seeds and nuts. Stopped sodas, but eating more honey.   -+ CKD;  last BUN/creatinine:  Lab Results  Component Value Date   BUN 15 06/03/2019   CREATININE 0.54 06/03/2019   Normal ACR: Lab Results  Component Value Date   MICRALBCREAT 0.6 05/14/2018   MICRALBCREAT 0.7 02/05/2016    MICRALBCREAT 0.7 12/25/2014   MICRALBCREAT 2.0 06/13/2014   MICRALBCREAT 0.7 04/04/2013   MICRALBCREAT 0.5 01/30/2012   MICRALBCREAT 0.3 06/17/2011  We stopped lisinopril as she had low blood pressure and dizziness in the past. ACR's >> not high in last 5 years. 24 h urine for proteins >> normal. Now on losartan.  -+ HL; last set of lipids: Lab Results  Component Value Date   CHOL 142 01/14/2019   HDL 50.30 01/14/2019   LDLCALC 78 01/14/2019   TRIG 70.0 01/14/2019   CHOLHDL 3 01/14/2019  On Crestor 20. - last eye exam was in 2019: No DR -No numbness and tingling in her feet.  ROS: Constitutional: no weight gain/no weight loss, no fatigue, no subjective hyperthermia, no subjective hypothermia Eyes: no blurry vision, no xerophthalmia ENT: no sore throat, no nodules palpated in neck, no dysphagia, no odynophagia, no hoarseness Cardiovascular: no CP/no SOB/no palpitations/no leg swelling Respiratory: no cough/no SOB/no wheezing Gastrointestinal: no N/no V/no D/no C/no acid reflux Musculoskeletal: no muscle aches/no joint aches Skin: no rashes, no hair loss Neurological: no tremors/no numbness/no tingling/no dizziness  I reviewed pt's medications, allergies, PMH, social hx, family hx, and changes were documented in the history of present illness. Otherwise, unchanged from my initial  visit note.  Past Medical History:  Diagnosis Date  . Arthritis    Hips   . Colon polyps 2008   [name]  . Diabetes mellitus 2006  . Hyperlipemia 2012  . Hypertension 2012   off meds at 01-08-18 PV   . Obesity    Past Surgical History:  Procedure Laterality Date  . BREAST BIOPSY  2003  . COLONOSCOPY    . POLYPECTOMY    . TUBAL LIGATION  1990  . VAGINAL HYSTERECTOMY  2006  . WISDOM TOOTH EXTRACTION     History   Social History  . Marital Status: Married    Spouse Name: N/A  . Number of Children: 3   Occupational History  . RN    Social History Main Topics  . Smoking  status: Never Smoker   . Smokeless tobacco: Never Used  . Alcohol Use: No  . Drug Use: No   Social History Narrative   3 caffeine drinks daily    Current Outpatient Medications on File Prior to Visit  Medication Sig Dispense Refill  . empagliflozin (JARDIANCE) 25 MG TABS tablet Take 25 mg by mouth daily. 30 tablet 11  . fluconazole (DIFLUCAN) 150 MG tablet TAKE 1 TABLET BY MOUTH ONCE FOR 1 DOSE 2 tablet 1  . fluticasone (FLONASE) 50 MCG/ACT nasal spray Place 2 sprays into both nostrils daily. 16 g 6  . glimepiride (AMARYL) 2 MG tablet TAKE 1 TABLET BY MOUTH TWICE DAILY BEFORE BREAKFAST AND DINNER 180 tablet 3  . Glucos-Chond-Hyal Ac-Ca Fructo (MOVE FREE JOINT HEALTH ADVANCE) TABS Take 1 tablet by mouth daily.    Marland Kitchen loratadine (CLARITIN) 10 MG tablet Take 1 tablet (10 mg total) by mouth daily. 30 tablet 11  . losartan (COZAAR) 100 MG tablet Take 1 tablet (100 mg total) by mouth daily. 90 tablet 3  . metFORMIN (GLUCOPHAGE-XR) 500 MG 24 hr tablet TAKE 1 TABLET BY MOUTH EVERY MORNING AND 2 TABLETS WITH DINNER 270 tablet 0  . ONETOUCH VERIO test strip CHECK YOUR BLOOD SUGAR TWICE DAILY 100 strip 11  . OZEMPIC, 0.25 OR 0.5 MG/DOSE, 2 MG/1.5ML SOPN Inject 0.5 mg into the skin once a week. 4 pen 3  . rosuvastatin (CRESTOR) 20 MG tablet Take 1 tablet (20 mg total) by mouth at bedtime. 90 tablet 1  . Vitamin D, Ergocalciferol, (DRISDOL) 1.25 MG (50000 UT) CAPS capsule TAKE ONE CAPSULE BY MOUTH EVERY 7 DAYS FOR 6 DOSES 6 capsule 4   No current facility-administered medications on file prior to visit.    Allergies  Allergen Reactions  . Bactrim Nausea Only  . Sulfa Antibiotics Nausea And Vomiting  . Sulfamethoxazole-Trimethoprim    Family History  Problem Relation Age of Onset  . Heart failure Father   . Hypertension Father   . Colon cancer Father 7  . Diabetes Father   . Hypertension Mother   . Breast cancer Maternal Grandmother   . Diabetes Paternal Grandmother   . Colon polyps Neg Hx    . Esophageal cancer Neg Hx   . Rectal cancer Neg Hx   . Stomach cancer Neg Hx    PE: BP 120/60   Pulse 92   Ht 5\' 5"  (1.651 m)   Wt 231 lb (104.8 kg)   SpO2 97%   BMI 38.44 kg/m  Body mass index is 38.44 kg/m. Wt Readings from Last 3 Encounters:  09/12/19 231 lb (104.8 kg)  07/22/19 230 lb 4 oz (104.4 kg)  06/08/19 231 lb (104.8  kg)   Constitutional: overweight, in NAD Eyes: PERRLA, EOMI, no exophthalmos ENT: moist mucous membranes, no thyromegaly, no cervical lymphadenopathy Cardiovascular: RRR, No MRG Respiratory: CTA B Gastrointestinal: abdomen soft, NT, ND, BS+ Musculoskeletal: no deformities, strength intact in all 4 Skin: moist, warm, no rashes Neurological: no tremor with outstretched hands, DTR normal in all 4  ASSESSMENT: 1. DM2, non-insulin-dependent, now better controlled, without long term complications, but with hyperglycemia  2. Obesity class 2 BMI Classification:  < 18.5 underweight   18.5-24.9 normal weight   25.0-29.9 overweight   30.0-34.9 class I obesity   35.0-39.9 class II obesity  ? 40.0 class III obesity   3. HL  PLAN:  1. Patient with longstanding, uncontrolled, type 2 diabetes, with improvement in blood sugars after adding SGLT2 inhibitor and GLP-1 receptor agonist.  She had problems with her insurance not covering GLP-1 receptor agonist we had to give her samples of Ozempic.  She was able to obtain Trulicity afterwards, and sugars improved, however, she developed nausea with this.  We completed a PA for Ozempic and finally this was approved at the end of 07/2019.  She tolerates Ozempic well. -At this visit, sugars are at goal in the morning but she had one hyperglycemic spike after eating sweets.  She did not check his sugars very frequently and we discussed about the importance of doing so and also checking sugars later in the day. -For now, we checked her HbA1c: 6.4% (improved), still we will not make drastic changes in her regimen but I  did advise her to take glimepiride only before larger meals with the exception of holiday meals, when she probably will need to take 4 mg.  At next visit, we may be able to stop the glimepiride completely and increase Ozempic. - I recommended:  Patient Instructions  Please continue: - Metformin XR 500 mg with lunch and 1000 mg with dinner - Jardiance 25 mg before breakfast - Glimepiride 2 to 4 mg 2x a day before meals - Ozempic 0.5 mg weekly  Please return in 4 months with your sugar log.   - advised to check sugars at different times of the day - 1x a day, rotating check times - advised for yearly eye exams >> she is due - refuses flu shot - return to clinic in 4 months     2. Obesity class 2 -Continue Invokana and Ozempic which should also help with weight loss -No significant weight loss since last visit  3. HL -Reviewed latest lipid panel from 12/2018: All fractions at goal: Lab Results  Component Value Date   CHOL 142 01/14/2019   HDL 50.30 01/14/2019   LDLCALC 78 01/14/2019   TRIG 70.0 01/14/2019   CHOLHDL 3 01/14/2019  -Continues Crestor without side effects.  Philemon Kingdom, MD PhD [facility] Endocrinology

## 2019-10-30 ENCOUNTER — Encounter (INDEPENDENT_AMBULATORY_CARE_PROVIDER_SITE_OTHER): Payer: Self-pay | Admitting: Family Medicine

## 2019-10-31 ENCOUNTER — Encounter: Payer: Self-pay | Admitting: Family Medicine

## 2019-11-01 ENCOUNTER — Other Ambulatory Visit: Payer: Self-pay

## 2019-11-01 ENCOUNTER — Ambulatory Visit (INDEPENDENT_AMBULATORY_CARE_PROVIDER_SITE_OTHER): Payer: 59 | Admitting: Family Medicine

## 2019-11-01 ENCOUNTER — Encounter: Payer: Self-pay | Admitting: Family Medicine

## 2019-11-01 VITALS — Temp 98.9°F | Ht 65.0 in

## 2019-11-01 DIAGNOSIS — J0140 Acute pansinusitis, unspecified: Secondary | ICD-10-CM | POA: Diagnosis not present

## 2019-11-01 DIAGNOSIS — N760 Acute vaginitis: Secondary | ICD-10-CM

## 2019-11-01 MED ORDER — FLUCONAZOLE 150 MG PO TABS: ORAL_TABLET | ORAL | 1 refills | Status: DC

## 2019-11-01 MED ORDER — AMOXICILLIN-POT CLAVULANATE 875-125 MG PO TABS: 1.0000 | ORAL_TABLET | Freq: Two times a day (BID) | ORAL | 0 refills | Status: DC

## 2019-11-01 NOTE — Progress Notes (Signed)
Virtual Visit via Video Note  I connected with Angela Barr on 11/01/19 at  9:40 AM EST by a video enabled telemedicine application and verified that I am speaking with the correct person using two identifiers.  Location: Patient: home alone Provider: home   I discussed the limitations of evaluation and management by telemedicine and the availability of in person appointments. The patient expressed understanding and agreed to proceed.  History of Present Illness: Pt is home and c/o headache over her eyes and scratchy throat   She has had 4 neg covid tests.  + nasal congestion.  No fever   she is taking tylenol and zyrtec  Past Medical History:  Diagnosis Date  . Arthritis    Hips   . Colon polyps 2008   [name]  . Diabetes mellitus 2006  . Hyperlipemia 2012  . Hypertension 2012   off meds at 01-08-18 PV   . Obesity    Current Outpatient Medications on File Prior to Visit  Medication Sig Dispense Refill  . empagliflozin (JARDIANCE) 25 MG TABS tablet Take 25 mg by mouth daily. 30 tablet 11  . fluticasone (FLONASE) 50 MCG/ACT nasal spray Place 2 sprays into both nostrils daily. 16 g 6  . glimepiride (AMARYL) 2 MG tablet TAKE 1 TABLET BY MOUTH TWICE DAILY BEFORE BREAKFAST AND DINNER 180 tablet 3  . Glucos-Chond-Hyal Ac-Ca Fructo (MOVE FREE JOINT HEALTH ADVANCE) TABS Take 1 tablet by mouth daily.    Marland Kitchen loratadine (CLARITIN) 10 MG tablet Take 1 tablet (10 mg total) by mouth daily. 30 tablet 11  . losartan (COZAAR) 100 MG tablet Take 1 tablet (100 mg total) by mouth daily. 90 tablet 3  . metFORMIN (GLUCOPHAGE-XR) 500 MG 24 hr tablet TAKE 1 TABLET BY MOUTH EVERY MORNING AND 2 TABLETS WITH DINNER 270 tablet 0  . ONETOUCH VERIO test strip CHECK YOUR BLOOD SUGAR TWICE DAILY 100 strip 11  . OZEMPIC, 0.25 OR 0.5 MG/DOSE, 2 MG/1.5ML SOPN Inject 0.5 mg into the skin once a week. 12 pen 3  . rosuvastatin (CRESTOR) 20 MG tablet Take 1 tablet (20 mg total) by mouth at bedtime. 90  tablet 1  . Vitamin D, Ergocalciferol, (DRISDOL) 1.25 MG (50000 UT) CAPS capsule TAKE ONE CAPSULE BY MOUTH EVERY 7 DAYS FOR 6 DOSES 6 capsule 4   No current facility-administered medications on file prior to visit.   Social History   Socioeconomic History  . Marital status: Married    Spouse name: Not on file  . Number of children: 3  . Years of education: Not on file  . Highest education level: Not on file  Occupational History  . Occupation: Therapist, sports  Tobacco Use  . Smoking status: Never Smoker  . Smokeless tobacco: Never Used  Substance and Sexual Activity  . Alcohol use: No  . Drug use: No  . Sexual activity: Not on file  Other Topics Concern  . Not on file  Social History Narrative   3 caffeine drinks daily.   Exercise-no   Lives at home with husband    Social Determinants of Health   Financial Resource Strain:   . Difficulty of Paying Living Expenses: Not on file  Food Insecurity:   . Worried About Charity fundraiser in the Last Year: Not on file  . Ran Out of Food in the Last Year: Not on file  Transportation Needs:   . Lack of Transportation (Medical): Not on file  . Lack of Transportation (Non-Medical): Not on  file  Physical Activity:   . Days of Exercise per Week: Not on file  . Minutes of Exercise per Session: Not on file  Stress:   . Feeling of Stress : Not on file  Social Connections:   . Frequency of Communication with Friends and Family: Not on file  . Frequency of Social Gatherings with Friends and Family: Not on file  . Attends Religious Services: Not on file  . Active Member of Clubs or Organizations: Not on file  . Attends Archivist Meetings: Not on file  . Marital Status: Not on file  Intimate Partner Violence:   . Fear of Current or Ex-Partner: Not on file  . Emotionally Abused: Not on file  . Physically Abused: Not on file  . Sexually Abused: Not on file   Allergies  Allergen Reactions  . Bactrim Nausea Only  . Sulfa Antibiotics  Nausea And Vomiting  . Sulfamethoxazole-Trimethoprim     Observations/Objective: Vitals:   11/01/19 0945  Temp: 98.9 F (37.2 C)  SpO2: 98%  pt is in NAD   Assessment and Plan: 1. Vaginitis and vulvovaginitis Probably from jardiance--- f/u endo  - fluconazole (DIFLUCAN) 150 MG tablet; TAKE 1 TABLET BY MOUTH ONCE FOR 1 DOSE  Dispense: 2 tablet; Refill: 1  2. Acute non-recurrent pansinusitis con't flonase and zyrtec abx per orders Pt has otc cough med as well Call if symptoms do not improve - amoxicillin-clavulanate (AUGMENTIN) 875-125 MG tablet; Take 1 tablet by mouth 2 (two) times daily.  Dispense: 20 tablet; Refill: 0   Follow Up Instructions:    I discussed the assessment and treatment plan with the patient. The patient was provided an opportunity to ask questions and all were answered. The patient agreed with the plan and demonstrated an understanding of the instructions.   The patient was advised to call back or seek an in-person evaluation if the symptoms worsen or if the condition fails to improve as anticipated.  I provided 15 minutes of non-face-to-face time during this encounter.   [name], DO

## 2019-11-03 ENCOUNTER — Ambulatory Visit (INDEPENDENT_AMBULATORY_CARE_PROVIDER_SITE_OTHER): Payer: 59 | Admitting: Family Medicine

## 2019-11-07 ENCOUNTER — Ambulatory Visit: Payer: 59 | Admitting: Family Medicine

## 2019-11-08 ENCOUNTER — Encounter: Payer: Self-pay | Admitting: Family Medicine

## 2019-11-08 NOTE — Telephone Encounter (Signed)
Letter in mychart

## 2019-11-14 ENCOUNTER — Other Ambulatory Visit: Payer: Self-pay

## 2019-11-15 ENCOUNTER — Ambulatory Visit (INDEPENDENT_AMBULATORY_CARE_PROVIDER_SITE_OTHER): Payer: 59 | Admitting: Family Medicine

## 2019-11-15 ENCOUNTER — Encounter: Payer: Self-pay | Admitting: Family Medicine

## 2019-11-15 ENCOUNTER — Other Ambulatory Visit: Payer: Self-pay

## 2019-11-15 VITALS — BP 118/78 | HR 89 | Temp 97.8°F | Resp 18 | Ht 65.0 in | Wt 227.2 lb

## 2019-11-15 DIAGNOSIS — I10 Essential (primary) hypertension: Secondary | ICD-10-CM

## 2019-11-15 DIAGNOSIS — E785 Hyperlipidemia, unspecified: Secondary | ICD-10-CM | POA: Diagnosis not present

## 2019-11-15 DIAGNOSIS — E1169 Type 2 diabetes mellitus with other specified complication: Secondary | ICD-10-CM

## 2019-11-15 DIAGNOSIS — Z0000 Encounter for general adult medical examination without abnormal findings: Secondary | ICD-10-CM

## 2019-11-15 DIAGNOSIS — E559 Vitamin D deficiency, unspecified: Secondary | ICD-10-CM

## 2019-11-15 DIAGNOSIS — E118 Type 2 diabetes mellitus with unspecified complications: Secondary | ICD-10-CM | POA: Diagnosis not present

## 2019-11-15 DIAGNOSIS — Z20822 Contact with and (suspected) exposure to covid-19: Secondary | ICD-10-CM

## 2019-11-15 DIAGNOSIS — Z23 Encounter for immunization: Secondary | ICD-10-CM | POA: Diagnosis not present

## 2019-11-15 DIAGNOSIS — Z1211 Encounter for screening for malignant neoplasm of colon: Secondary | ICD-10-CM

## 2019-11-15 LAB — CBC WITH DIFFERENTIAL/PLATELET
Basophils Absolute: 0 10*3/uL (ref 0.0–0.1)
Basophils Relative: 0.8 % (ref 0.0–3.0)
Eosinophils Absolute: 0.1 10*3/uL (ref 0.0–0.7)
Eosinophils Relative: 2 % (ref 0.0–5.0)
HCT: 41.7 % (ref 36.0–46.0)
Hemoglobin: 13.3 g/dL (ref 12.0–15.0)
Lymphocytes Relative: 39.4 % (ref 12.0–46.0)
Lymphs Abs: 1.8 10*3/uL (ref 0.7–4.0)
MCHC: 31.9 g/dL (ref 30.0–36.0)
MCV: 80.7 fl (ref 78.0–100.0)
Monocytes Absolute: 0.3 10*3/uL (ref 0.1–1.0)
Monocytes Relative: 6.9 % (ref 3.0–12.0)
Neutro Abs: 2.4 10*3/uL (ref 1.4–7.7)
Neutrophils Relative %: 50.9 % (ref 43.0–77.0)
Platelets: 199 10*3/uL (ref 150.0–400.0)
RBC: 5.16 Mil/uL — ABNORMAL HIGH (ref 3.87–5.11)
RDW: 15.8 % — ABNORMAL HIGH (ref 11.5–15.5)
WBC: 4.6 10*3/uL (ref 4.0–10.5)

## 2019-11-15 LAB — SARS-COV-2 IGG: SARS-COV-2 IgG: 0.78

## 2019-11-15 LAB — COMPREHENSIVE METABOLIC PANEL
ALT: 41 U/L — ABNORMAL HIGH (ref 0–35)
AST: 25 U/L (ref 0–37)
Albumin: 4.2 g/dL (ref 3.5–5.2)
Alkaline Phosphatase: 73 U/L (ref 39–117)
BUN: 11 mg/dL (ref 6–23)
CO2: 27 mEq/L (ref 19–32)
Calcium: 9.5 mg/dL (ref 8.4–10.5)
Chloride: 106 mEq/L (ref 96–112)
Creatinine, Ser: 0.55 mg/dL (ref 0.40–1.20)
GFR: 137.78 mL/min (ref >60.00)
Glucose, Bld: 95 mg/dL (ref 70–99)
Potassium: 3.8 mEq/L (ref 3.5–5.1)
Sodium: 142 mEq/L (ref 135–145)
Total Bilirubin: 0.4 mg/dL (ref 0.2–1.2)
Total Protein: 6.6 g/dL (ref 6.0–8.3)

## 2019-11-15 LAB — LIPID PANEL
Cholesterol: 114 mg/dL (ref 0–200)
HDL: 49.4 mg/dL (ref >39.00)
LDL Cholesterol: 52 mg/dL (ref 0–99)
NonHDL: 64.2
Total CHOL/HDL Ratio: 2
Triglycerides: 60 mg/dL (ref 0.0–149.0)
VLDL: 12 mg/dL (ref 0.0–40.0)

## 2019-11-15 LAB — TSH: TSH: 0.43 u[IU]/mL (ref 0.35–4.50)

## 2019-11-15 LAB — VITAMIN D 25 HYDROXY (VIT D DEFICIENCY, FRACTURES): VITD: 49.37 ng/mL (ref 30.00–100.00)

## 2019-11-15 MED ORDER — ROSUVASTATIN CALCIUM 20 MG PO TABS: 20.0000 mg | ORAL_TABLET | Freq: Every day | ORAL | 1 refills | Status: DC

## 2019-11-15 NOTE — Progress Notes (Signed)
Subjective:     Angela Barr is a 59 y.o. female and is here for a comprehensive physical exam. The patient reports no problems.  Social History   Socioeconomic History  . Marital status: Married    Spouse name: Not on file  . Number of children: 3  . Years of education: Not on file  . Highest education level: Not on file  Occupational History  . Occupation: Therapist, sports  Tobacco Use  . Smoking status: Never Smoker  . Smokeless tobacco: Never Used  Substance and Sexual Activity  . Alcohol use: No  . Drug use: No  . Sexual activity: Not on file  Other Topics Concern  . Not on file  Social History Narrative   3 caffeine drinks daily.   Exercise-no   Lives at home with husband    Social Determinants of Health   Financial Resource Strain:   . Difficulty of Paying Living Expenses: Not on file  Food Insecurity:   . Worried About Charity fundraiser in the Last Year: Not on file  . Ran Out of Food in the Last Year: Not on file  Transportation Needs:   . Lack of Transportation (Medical): Not on file  . Lack of Transportation (Non-Medical): Not on file  Physical Activity:   . Days of Exercise per Week: Not on file  . Minutes of Exercise per Session: Not on file  Stress:   . Feeling of Stress : Not on file  Social Connections:   . Frequency of Communication with Friends and Family: Not on file  . Frequency of Social Gatherings with Friends and Family: Not on file  . Attends Religious Services: Not on file  . Active Member of Clubs or Organizations: Not on file  . Attends Archivist Meetings: Not on file  . Marital Status: Not on file  Intimate Partner Violence:   . Fear of Current or Ex-Partner: Not on file  . Emotionally Abused: Not on file  . Physically Abused: Not on file  . Sexually Abused: Not on file   Health Maintenance  Topic Date Due  . COLONOSCOPY  09/22/1980  . OPHTHALMOLOGY EXAM  01/22/2017  . TETANUS/TDAP  06/15/2017  . FOOT EXAM  11/14/2017  .  MAMMOGRAM  12/28/2019  . HEMOGLOBIN A1C  03/11/2020  . INFLUENZA VACCINE  Completed  . PNEUMOCOCCAL POLYSACCHARIDE VACCINE AGE 72-64 HIGH RISK  Completed  . Hepatitis C Screening  Completed  . HIV Screening  Completed    The following portions of the patient's history were reviewed and updated as appropriate:  She  has a past medical history of Arthritis, Colon polyps (2008), Diabetes mellitus (2006), Hyperlipemia (2012), Hypertension (2012), and Obesity. She does not have any pertinent problems on file. She  has a past surgical history that includes Vaginal hysterectomy (2006); Breast biopsy (2003); Tubal ligation (1990); Colonoscopy; Polypectomy; and Wisdom tooth extraction. Her family history includes Breast cancer in her maternal grandmother; Colon cancer (age of onset: 64) in her father; Diabetes in her father and paternal grandmother; Heart failure in her father; Hypertension in her father and mother. She  reports that she has never smoked. She has never used smokeless tobacco. She reports that she does not drink alcohol or use drugs. She has a current medication list which includes the following prescription(s): empagliflozin, fluconazole, fluticasone, glimepiride, move free joint health advance, loratadine, losartan, metformin, onetouch verio, ozempic (0.25 or 0.5 mg/dose), rosuvastatin, and vitamin d (ergocalciferol). Current Outpatient Medications on File Prior  to Visit  Medication Sig Dispense Refill  . empagliflozin (JARDIANCE) 25 MG TABS tablet Take 25 mg by mouth daily. 30 tablet 11  . fluconazole (DIFLUCAN) 150 MG tablet TAKE 1 TABLET BY MOUTH ONCE FOR 1 DOSE 2 tablet 1  . fluticasone (FLONASE) 50 MCG/ACT nasal spray Place 2 sprays into both nostrils daily. 16 g 6  . glimepiride (AMARYL) 2 MG tablet TAKE 1 TABLET BY MOUTH TWICE DAILY BEFORE BREAKFAST AND DINNER 180 tablet 3  . Glucos-Chond-Hyal Ac-Ca Fructo (MOVE FREE JOINT HEALTH ADVANCE) TABS Take 1 tablet by mouth daily.    Marland Kitchen  loratadine (CLARITIN) 10 MG tablet Take 1 tablet (10 mg total) by mouth daily. 30 tablet 11  . losartan (COZAAR) 100 MG tablet Take 1 tablet (100 mg total) by mouth daily. 90 tablet 3  . metFORMIN (GLUCOPHAGE-XR) 500 MG 24 hr tablet TAKE 1 TABLET BY MOUTH EVERY MORNING AND 2 TABLETS WITH DINNER 270 tablet 0  . ONETOUCH VERIO test strip CHECK YOUR BLOOD SUGAR TWICE DAILY 100 strip 11  . OZEMPIC, 0.25 OR 0.5 MG/DOSE, 2 MG/1.5ML SOPN Inject 0.5 mg into the skin once a week. 12 pen 3  . Vitamin D, Ergocalciferol, (DRISDOL) 1.25 MG (50000 UT) CAPS capsule TAKE ONE CAPSULE BY MOUTH EVERY 7 DAYS FOR 6 DOSES 6 capsule 4   No current facility-administered medications on file prior to visit.   She is allergic to bactrim; sulfa antibiotics; and sulfamethoxazole-trimethoprim..  Review of Systems Review of Systems  Constitutional: Negative for activity change, appetite change and fatigue.  HENT: Negative for hearing loss, congestion, tinnitus and ear discharge.  dentist q17m Eyes: Negative for visual disturbance (see optho q1y -- vision corrected to 20/20 with glasses).  Respiratory: Negative for cough, chest tightness and shortness of breath.   Cardiovascular: Negative for chest pain, palpitations and leg swelling.  Gastrointestinal: Negative for abdominal pain, diarrhea, constipation and abdominal distention.  Genitourinary: Negative for urgency, frequency, decreased urine volume and difficulty urinating.  Musculoskeletal: Negative for back pain, arthralgias and gait problem.  Skin: Negative for color change, pallor and rash.  Neurological: Negative for dizziness, light-headedness, numbness and headaches.  Hematological: Negative for adenopathy. Does not bruise/bleed easily.  Psychiatric/Behavioral: Negative for suicidal ideas, confusion, sleep disturbance, self-injury, dysphoric mood, decreased concentration and agitation.       Objective:    BP 118/78 (BP Location: Right Arm, Patient Position:  Sitting, Cuff Size: Large)   Pulse 89   Temp 97.8 F (36.6 C) (Temporal)   Resp 18   Ht 5\' 5"  (1.651 m)   Wt 227 lb 3.2 oz (103.1 kg)   SpO2 97%   BMI 37.81 kg/m  General appearance: alert, cooperative, appears stated age and no distress Head: Normocephalic, without obvious abnormality, atraumatic Eyes: negative findings: lids and lashes normal, conjunctivae and sclerae normal and pupils equal, round, reactive to light and accomodation Ears: normal TM's and external ear canals both ears Neck: no adenopathy, no carotid bruit, no JVD, supple, symmetrical, trachea midline and thyroid not enlarged, symmetric, no tenderness/mass/nodules Back: symmetric, no curvature. ROM normal. No CVA tenderness. Lungs: clear to auscultation bilaterally Breasts: normal appearance, no masses or tenderness Heart: regular rate and rhythm, S1, S2 normal, no murmur, click, rub or gallop Abdomen: soft, non-tender; bowel sounds normal; no masses,  no organomegaly Pelvic: not indicated; status post hysterectomy, negative ROS Extremities: extremities normal, atraumatic, no cyanosis or edema Pulses: 2+ and symmetric Skin: Skin color, texture, turgor normal. No rashes or lesions Lymph nodes:  Cervical, supraclavicular, and axillary nodes normal. Neurologic: Alert and oriented X 3, normal strength and tone. Normal symmetric reflexes. Normal coordination and gait    Assessment:    Healthy female exam.      Plan:    ghm utd Check labs  See After Visit Summary for Counseling Recommendations    1. Preventative health care See above  - TSH - Lipid panel - CBC with Differential/Platelet - Comprehensive metabolic panel  2. Controlled type 2 diabetes mellitus with complication, without long-term current use of insulin ([place]) hgba1c to be checked, minimize simple carbs. Increase exercise as tolerated. Continue current meds   3. Essential hypertension Well controlled, no changes to meds. Encouraged heart healthy  diet such as the DASH diet and exercise as tolerated.  - CBC with Differential/Platelet - Comprehensive metabolic panel  4. Hyperlipidemia associated with type 2 diabetes mellitus ([place]) Encouraged heart healthy diet, increase exercise, avoid trans fats, consider a krill oil cap daily - Lipid panel - rosuvastatin (CRESTOR) 20 MG tablet; Take 1 tablet (20 mg total) by mouth at bedtime.  Dispense: 90 tablet; Refill: 1  5. Vitamin D deficiency  - Vitamin D (25 hydroxy)  6. Colon cancer screening  - Ambulatory referral to Gastroenterology  7. Need for tetanus booster  - Tdap vaccine greater than or equal to 7yo IM  8. Exposure to COVID-19 virus  - SARS-COV-2 IgG

## 2019-11-15 NOTE — Patient Instructions (Signed)

## 2019-11-17 ENCOUNTER — Ambulatory Visit (INDEPENDENT_AMBULATORY_CARE_PROVIDER_SITE_OTHER): Payer: 59 | Admitting: Family Medicine

## 2019-11-25 ENCOUNTER — Encounter: Payer: Self-pay | Admitting: Family Medicine

## 2019-11-25 NOTE — Telephone Encounter (Signed)
It is not a normal reaction--- we can see her if she like

## 2019-12-01 ENCOUNTER — Ambulatory Visit (INDEPENDENT_AMBULATORY_CARE_PROVIDER_SITE_OTHER): Payer: 59 | Admitting: Family Medicine

## 2019-12-08 ENCOUNTER — Other Ambulatory Visit: Payer: Self-pay | Admitting: Internal Medicine

## 2019-12-08 DIAGNOSIS — E0822 Diabetes mellitus due to underlying condition with diabetic chronic kidney disease: Secondary | ICD-10-CM

## 2019-12-09 ENCOUNTER — Telehealth: Payer: Self-pay | Admitting: Internal Medicine

## 2019-12-09 MED ORDER — GLIPIZIDE 5 MG PO TABS: 5.0000 mg | ORAL_TABLET | Freq: Two times a day (BID) | ORAL | 3 refills | Status: DC

## 2019-12-09 NOTE — Telephone Encounter (Signed)
Notified patient of message from Dr. Gherghe, patient expressed understanding and agreement. No further questions.  RX sent. 

## 2019-12-09 NOTE — Telephone Encounter (Signed)
Let's try Glipizide 5 mg before b'fast and dinner. Can send 180 tabs with 3 refills

## 2019-12-09 NOTE — Telephone Encounter (Signed)
Patient called re: [facility] ([facility] on [address]) told patient that patient is unable to get a refill of glimepiride (AMARYL) 2 MG tablet until March 1st or March 9th due to it being to soon to refill. Patient is out of the above medication as of yesterday. Patient's blood sugar this morning was 141. Patient requests to be called at ph# [phone] to let her know if Dr. Cruzita Lederer wants patient to stop taking the medication listed above.

## 2020-01-02 ENCOUNTER — Other Ambulatory Visit: Payer: Self-pay | Admitting: Family Medicine

## 2020-01-02 ENCOUNTER — Telehealth: Payer: Self-pay | Admitting: Family Medicine

## 2020-01-02 DIAGNOSIS — Z1159 Encounter for screening for other viral diseases: Secondary | ICD-10-CM

## 2020-01-02 DIAGNOSIS — Z23 Encounter for immunization: Secondary | ICD-10-CM

## 2020-01-02 NOTE — Telephone Encounter (Signed)
Order in.

## 2020-01-02 NOTE — Telephone Encounter (Signed)
Pt called. Lab appointment scheduled for tomorrow

## 2020-01-02 NOTE — Telephone Encounter (Signed)
Pt does not have Hep B or MMR in our records. Pt requesting titers to be ordered. Please advice. Vaccine records placed at the front for pt pickup

## 2020-01-02 NOTE — Telephone Encounter (Signed)
PT states she needs a copy of her immuzations.  Hep B MMR last tetnas shot & Qurantine. Please call once ready for pickup

## 2020-01-03 ENCOUNTER — Other Ambulatory Visit (INDEPENDENT_AMBULATORY_CARE_PROVIDER_SITE_OTHER): Payer: 59

## 2020-01-03 ENCOUNTER — Encounter: Payer: Self-pay | Admitting: Family Medicine

## 2020-01-03 ENCOUNTER — Other Ambulatory Visit: Payer: Self-pay | Admitting: Family Medicine

## 2020-01-03 ENCOUNTER — Other Ambulatory Visit: Payer: Self-pay

## 2020-01-03 DIAGNOSIS — Z8619 Personal history of other infectious and parasitic diseases: Secondary | ICD-10-CM

## 2020-01-03 DIAGNOSIS — Z23 Encounter for immunization: Secondary | ICD-10-CM

## 2020-01-03 DIAGNOSIS — Z1159 Encounter for screening for other viral diseases: Secondary | ICD-10-CM | POA: Diagnosis not present

## 2020-01-04 LAB — MEASLES/MUMPS/RUBELLA IMMUNITY
Mumps IgG: >300 AU/mL
Rubella: 11.4 Index
Rubeola IgG: 66.5 AU/mL

## 2020-01-04 LAB — TEST AUTHORIZATION

## 2020-01-04 LAB — HEPATITIS B SURFACE ANTIBODY, QUANTITATIVE: Hep B S AB Quant (Post): 23 m[IU]/mL (ref >=10)

## 2020-01-04 LAB — VARICELLA ZOSTER ANTIBODY, IGG: Varicella IgG: <135 index — ABNORMAL LOW

## 2020-01-04 NOTE — Progress Notes (Signed)
Form printed and stamped and faxed

## 2020-01-10 ENCOUNTER — Ambulatory Visit: Payer: 59 | Admitting: Internal Medicine

## 2020-01-10 ENCOUNTER — Encounter: Payer: Self-pay | Admitting: Internal Medicine

## 2020-01-10 ENCOUNTER — Other Ambulatory Visit: Payer: Self-pay

## 2020-01-10 VITALS — BP 118/80 | HR 96 | Ht 65.0 in | Wt 229.0 lb

## 2020-01-10 DIAGNOSIS — E785 Hyperlipidemia, unspecified: Secondary | ICD-10-CM

## 2020-01-10 DIAGNOSIS — E669 Obesity, unspecified: Secondary | ICD-10-CM | POA: Diagnosis not present

## 2020-01-10 DIAGNOSIS — E1165 Type 2 diabetes mellitus with hyperglycemia: Secondary | ICD-10-CM | POA: Diagnosis not present

## 2020-01-10 LAB — POCT GLYCOSYLATED HEMOGLOBIN (HGB A1C): Hemoglobin A1C: 7.1 % — AB (ref 4.0–5.6)

## 2020-01-10 NOTE — Progress Notes (Signed)
Patient ID: Angela Barr, female   DOB: [birth year], 59 y.o.   MRN: [id number]  This visit occurred during the SARS-CoV-2 public health emergency.  Safety protocols were in place, including screening questions prior to the visit, additional usage of staff PPE, and extensive cleaning of exam room while observing appropriate contact time as indicated for disinfecting solutions.   HPI: Angela Barr is a 59 y.o.-year-old female, returning for f/u for DM2, dx in 2007, non-insulin-dependent, uncontrolled, without long term complications, but with hyperglycemia. Last visit 4 months ago.  She relaxed her diet since last OV >> sugars are higher.   Reviewed HbA1c levels: Lab Results  Component Value Date   HGBA1C 6.4 (A) 09/12/2019   HGBA1C 6.7 (A) 05/12/2019   HGBA1C 7.2 (A) 01/19/2019  09/25/2015: HbA1c 7.1%  She is now on: - Metformin XR 500 mg with lunch and 1000 mg with dinner - Invokana 300 mg before breakfast - h/o yeast inf >> Jardiance 25 mg in a.m.  - Glimepiride 2 to 4 mg 2x a day before meals >> Glipizide 5 mg before breakfast and dinner  >> Trulicity 1.5 mg weekly  - nausea >> Ozempic 0.5 mg weekly Stopped Trulicity 1.5 mg >> stopped b/c Nausea and HAs, and also expensive - now tolerating it well, with mild nausea  She was Bydureon >> lumps at inj. Site.  She was on Victoza, now too expensive.  She is checking sugars 0-1 time a day: - am:  115, 131-148, 158, 170 (watermelon) >> 113-121 >> 125-156 - 2h after b'fast: 125-171, 197 >> n/c >> 140, 151, 185 >> 191 - before lunch: 85-140 >> 141-173 >> 120s, 190 >> n/c - 2h after lunch: 105, 112 >> 144-220, 270 >> n/c - before dinner:  101, 167 >> 89-110 >> 116 >> n/c - 2h after dinner: n/c >> 167-227 >> 178 >> n/c - bedtime:  n/c >> 156 >> 102 >> 149 >> n/c - nighttime: n/c >> 70s occas. >> 129 >> n/c Lowest sugar was 89 >> 113 >> 125; she has hypoglycemia awareness in the 90s. Highest sugar was 185 >> 191.  Glucometer: One  Touch Verio Flex   Eats sunflower seeds and nuts. Stopped sodas, but lately had more sweet tea.  -No CKD; Last BUN/creatinine:  Lab Results  Component Value Date   BUN 11 11/15/2019   CREATININE 0.55 11/15/2019   Normal ACR: Lab Results  Component Value Date   MICRALBCREAT 0.6 05/14/2018   MICRALBCREAT 0.7 02/05/2016   MICRALBCREAT 0.7 12/25/2014   MICRALBCREAT 2.0 06/13/2014   MICRALBCREAT 0.7 04/04/2013   MICRALBCREAT 0.5 01/30/2012   MICRALBCREAT 0.3 06/17/2011  We stopped lisinopril as she had low blood pressure and dizziness in the past. ACR's >> not high in last 5 years. 24 h urine for proteins >> normal. On losartan now.  -+ HL; last set of lipids: Lab Results  Component Value Date   CHOL 114 11/15/2019   HDL 49.40 11/15/2019   LDLCALC 52 11/15/2019   TRIG 60.0 11/15/2019   CHOLHDL 2 11/15/2019  On Crestor 20. - last eye exam was in 2020: No DR -No numbness and tingling in her feet.  Foot exam recently normal.  ROS: Constitutional: no weight gain/no weight loss, no fatigue, no subjective hyperthermia, no subjective hypothermia Eyes: no blurry vision, no xerophthalmia ENT: no sore throat, no nodules palpated in neck, no dysphagia, no odynophagia, no hoarseness Cardiovascular: no CP/no SOB/no palpitations/no leg swelling Respiratory: no cough/no SOB/no wheezing Gastrointestinal: no  N/no V/no D/no C/no acid reflux, + abd. cramps Musculoskeletal: no muscle aches/no joint aches Skin: no rashes, no hair loss Neurological: no tremors/no numbness/no tingling/no dizziness  I reviewed pt's medications, allergies, PMH, social hx, family hx, and changes were documented in the history of present illness. Otherwise, unchanged from my initial visit note.  Past Medical History:  Diagnosis Date  . Arthritis    Hips   . Colon polyps 2008   [name]  . Diabetes mellitus 2006  . Hyperlipemia 2012  . Hypertension 2012   off meds at 01-08-18 [facility]   . Obesity     Past Surgical History:  Procedure Laterality Date  . BREAST BIOPSY  2003  . COLONOSCOPY    . POLYPECTOMY    . TUBAL LIGATION  1990  . VAGINAL HYSTERECTOMY  2006  . WISDOM TOOTH EXTRACTION     History   Social History  . Marital Status: Married    Spouse Name: N/A  . Number of Children: 3   Occupational History  . RN    Social History Main Topics  . Smoking status: Never Smoker   . Smokeless tobacco: Never Used  . Alcohol Use: No  . Drug Use: No   Social History Narrative   3 caffeine drinks daily    Current Outpatient Medications on File Prior to Visit  Medication Sig Dispense Refill  . empagliflozin (JARDIANCE) 25 MG TABS tablet Take 25 mg by mouth daily. 30 tablet 11  . fluconazole (DIFLUCAN) 150 MG tablet TAKE 1 TABLET BY MOUTH ONCE FOR 1 DOSE 2 tablet 1  . fluticasone (FLONASE) 50 MCG/ACT nasal spray Place 2 sprays into both nostrils daily. 16 g 6  . glipiZIDE (GLUCOTROL) 5 MG tablet Take 1 tablet (5 mg total) by mouth 2 (two) times daily before a meal. 180 tablet 3  . Glucos-Chond-Hyal Ac-Ca Fructo (MOVE FREE JOINT HEALTH ADVANCE) TABS Take 1 tablet by mouth daily.    Marland Kitchen loratadine (CLARITIN) 10 MG tablet Take 1 tablet (10 mg total) by mouth daily. 30 tablet 11  . losartan (COZAAR) 100 MG tablet Take 1 tablet (100 mg total) by mouth daily. 90 tablet 3  . metFORMIN (GLUCOPHAGE-XR) 500 MG 24 hr tablet TAKE 1 TABLET BY MOUTH EVERY MORNING AND 2 TABLETS WITH DINNER 270 tablet 0  . ONETOUCH VERIO test strip CHECK YOUR BLOOD SUGAR TWICE DAILY 100 strip 11  . OZEMPIC, 0.25 OR 0.5 MG/DOSE, 2 MG/1.5ML SOPN Inject 0.5 mg into the skin once a week. 12 pen 3  . rosuvastatin (CRESTOR) 20 MG tablet Take 1 tablet (20 mg total) by mouth at bedtime. 90 tablet 1  . Vitamin D, Ergocalciferol, (DRISDOL) 1.25 MG (50000 UT) CAPS capsule TAKE ONE CAPSULE BY MOUTH EVERY 7 DAYS FOR 6 DOSES 6 capsule 4   No current facility-administered medications on file prior to visit.   Allergies   Allergen Reactions  . Bactrim Nausea Only  . Sulfa Antibiotics Nausea And Vomiting  . Sulfamethoxazole-Trimethoprim    Family History  Problem Relation Age of Onset  . Heart failure Father   . Hypertension Father   . Colon cancer Father 58  . Diabetes Father   . Hypertension Mother   . Breast cancer Maternal Grandmother   . Diabetes Paternal Grandmother   . Colon polyps Neg Hx   . Esophageal cancer Neg Hx   . Rectal cancer Neg Hx   . Stomach cancer Neg Hx    PE: BP 118/80   Pulse  96   Ht 5\' 5"  (1.651 m)   Wt 229 lb (103.9 kg)   SpO2 98%   BMI 38.11 kg/m  Body mass index is 38.11 kg/m. Wt Readings from Last 3 Encounters:  01/10/20 229 lb (103.9 kg)  11/15/19 227 lb 3.2 oz (103.1 kg)  09/12/19 231 lb (104.8 kg)   Constitutional: overweight, in NAD Eyes: PERRLA, EOMI, no exophthalmos ENT: moist mucous membranes, no thyromegaly, no cervical lymphadenopathy Cardiovascular: tachycardia, RR, No MRG Respiratory: CTA B Gastrointestinal: abdomen soft, NT, ND, BS+ Musculoskeletal: no deformities, strength intact in all 4 Skin: moist, warm, no rashes Neurological: no tremor with outstretched hands, DTR normal in all 4  ASSESSMENT: 1. DM2, non-insulin-dependent, now better controlled, without long term complications, but with hyperglycemia  2. Obesity class 2 BMI Classification:  < 18.5 underweight   18.5-24.9 normal weight   25.0-29.9 overweight   30.0-34.9 class I obesity   35.0-39.9 class II obesity  ? 40.0 class III obesity   3. HL  PLAN:  1. Patient with longstanding, uncontrolled, type 2 diabetes, with provement in blood sugars after adding initiated to inhibitor and GLP-1 receptor agonist.  The PA for Ozempic was finally approved at the end of 07/2019, previously she was relying on samples and afterwards Trulicity.  She tolerates Ozempic well.  At last visit, sugars were low in the morning and she only had 1 hyperglycemic spike after eating sweets.  She did  not check her sugars very frequently and we discussed about doing so and also checking sugars later in the day.  However, HbA1c was 6.4% (improved), so we did not change her regimen.  Since last visit, though, we had to change from glimepiride to glipizide due to problems with her pharmacy. -At this visit, sugars, and she mentions that she relaxed at night since last visit: Including more processed foods and drinking sweet tea.  She only checks sugars in the morning and they are mostly above target.  She will need to also check some sugars later in the day.  We discussed that we can increase the Ozempic dose, but she does have some abdominal pain right now (unclear if from [place]) so I would prefer not to do it for now.  She is willing to start changing her diet and in that case, I am sure her sugars will improve.  If they do not, we may need to escalate her regimen at next visit.  She is also planning to establish care with the [facility]. - I recommended:  Patient Instructions  Please continue: - Metformin XR 500 mg with lunch and 1000 mg with dinner - Jardiance 25 mg before breakfast - Glipizide 5 mg 2x a day before meals - Ozempic 0.5 mg weekly  Please work on improving diet and cut out sweet tea.  Please return in 3-4 months with your sugar log.   - we checked her HbA1c: 7.1% (increased) - advised to check sugars at different times of the day - 1x a day, rotating check times - advised for yearly eye exams >> she is not UTD - return to clinic in 3-4 months    2. Obesity class 2 -Continue Invokana and Ozempic, which should also help with weight loss -lost 3 lbs since last OV -Planning to establish care with the [facility]  3. HL -Reviewed latest lipid panel from 1.5 months ago: At goal Lab Results  Component Value Date   CHOL 114 11/15/2019   HDL  49.40 11/15/2019   LDLCALC 52 11/15/2019   TRIG 60.0 11/15/2019   CHOLHDL 2 11/15/2019   -Continues Crestor without side effects  Philemon Kingdom, MD PhD [facility] Endocrinology

## 2020-01-10 NOTE — Patient Instructions (Addendum)
Please continue: - Metformin XR 500 mg with lunch and 1000 mg with dinner - Jardiance 25 mg before breakfast - Glipizide 5 mg 2x a day before meals - Ozempic 0.5 mg weekly  Please work on improving diet and cut out sweet tea.  Please return in 3-4 months with your sugar log.

## 2020-01-11 ENCOUNTER — Telehealth: Payer: Self-pay

## 2020-01-11 ENCOUNTER — Encounter: Payer: Self-pay | Admitting: Family Medicine

## 2020-01-11 NOTE — Telephone Encounter (Signed)
I was going to give her shingrix both shots and then we can check titre again  If neg--- we at least have documentation of getting vaccine and can write a note saying she is a non responder -----we can give her a note that we are giving her the vaccine and will recheck the titre  Will that work ?

## 2020-01-11 NOTE — Telephone Encounter (Signed)
Spoke with patient regarding Varicella titers/letter.  Patient will call school to determine what letter needs to include regarding titer/vaccine.  Patient hesitant to receive Shingrix vaccines. She will send message via MyChart after discussing with school.

## 2020-01-16 NOTE — Telephone Encounter (Signed)
See phone note 01/11/2020.

## 2020-01-18 ENCOUNTER — Encounter: Payer: Self-pay | Admitting: Family Medicine

## 2020-01-19 NOTE — Telephone Encounter (Signed)
No--  You are supposed to wait a month between covid vaccine and another vaccine

## 2020-01-23 ENCOUNTER — Other Ambulatory Visit: Payer: Self-pay

## 2020-01-23 ENCOUNTER — Ambulatory Visit (INDEPENDENT_AMBULATORY_CARE_PROVIDER_SITE_OTHER): Payer: 59 | Admitting: Bariatrics

## 2020-01-23 ENCOUNTER — Encounter (INDEPENDENT_AMBULATORY_CARE_PROVIDER_SITE_OTHER): Payer: Self-pay | Admitting: Bariatrics

## 2020-01-23 VITALS — BP 115/81 | HR 87 | Temp 98.2°F | Ht 64.0 in | Wt 226.0 lb

## 2020-01-23 DIAGNOSIS — Z0289 Encounter for other administrative examinations: Secondary | ICD-10-CM

## 2020-01-23 DIAGNOSIS — E1165 Type 2 diabetes mellitus with hyperglycemia: Secondary | ICD-10-CM

## 2020-01-23 DIAGNOSIS — Z1331 Encounter for screening for depression: Secondary | ICD-10-CM

## 2020-01-23 DIAGNOSIS — Z6838 Body mass index (BMI) 38.0-38.9, adult: Secondary | ICD-10-CM

## 2020-01-23 DIAGNOSIS — R0602 Shortness of breath: Secondary | ICD-10-CM | POA: Diagnosis not present

## 2020-01-23 DIAGNOSIS — E559 Vitamin D deficiency, unspecified: Secondary | ICD-10-CM

## 2020-01-23 DIAGNOSIS — R7401 Elevation of levels of liver transaminase levels: Secondary | ICD-10-CM

## 2020-01-23 DIAGNOSIS — E7849 Other hyperlipidemia: Secondary | ICD-10-CM

## 2020-01-23 DIAGNOSIS — R5383 Other fatigue: Secondary | ICD-10-CM | POA: Diagnosis not present

## 2020-01-23 DIAGNOSIS — Z794 Long term (current) use of insulin: Secondary | ICD-10-CM

## 2020-01-23 DIAGNOSIS — Z9189 Other specified personal risk factors, not elsewhere classified: Secondary | ICD-10-CM | POA: Diagnosis not present

## 2020-01-23 NOTE — Progress Notes (Signed)
Dear Dr. Sarina Ill,   Thank you for referring Angela Barr to our clinic. The following note includes my evaluation and treatment recommendations.  Chief Complaint:   OBESITY Angela Barr (MR# [id number]) is a 59 y.o. female who presents for evaluation and treatment of obesity and related comorbidities. Current BMI is Body mass index is 38.79 kg/m.Marland Kitchen Angela Barr has been struggling with her weight for many years and has been unsuccessful in either losing weight, maintaining weight loss, or reaching her healthy weight goal.  Angela Barr is currently in the action stage of change and ready to dedicate time achieving and maintaining a healthier weight. Angela Barr is interested in becoming our patient and working on intensive lifestyle modifications including (but not limited to) diet and exercise for weight loss.  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 She skips lunch.  Angela Barr's habits were reviewed today and are as follows: Her family eats meals together, she thinks her family will eat healthier with her at times, her desired weight loss is 41 lbs, she started gaining weight after having children, her heaviest weight ever was 236 pounds, she craves chips, fried chicken, cookies, and popcorn, she snacks frequently in the evenings, she skips lunch sometimes, she is frequently drinking liquids with calories, she frequently makes poor food choices, she has binge eating behaviors and she struggles with emotional eating.  Depression Screen Angela Barr's Food and Barr (modified PHQ-9) score was 5.  Depression screen Angela Barr 2/9 01/23/2020  Decreased Interest 1  Down, Depressed, Hopeless 0  PHQ - 2 Score 1  Altered sleeping 1  Tired, decreased energy 2  Change in appetite 1  Feeling bad or failure about yourself  0  Trouble concentrating 0  Moving slowly or fidgety/restless 0  Suicidal thoughts 0  PHQ-9 Score 5  Difficult doing work/chores Not difficult at all    Subjective:   Other fatigue. Angela Barr denies daytime somnolence and admits to waking up still tired. Angela Barr generally gets 4-5 hours of sleep per night, and states that she tosses and turns at night. Snoring is present. Apneic episodes are not present. Epworth Sleepiness Score is 7.  SOB (shortness of breath) on exertion. Angela Barr notes increasing shortness of breath with exercising and seems to be worsening over time with weight gain. She notes getting out of breath sooner with activity than she used to. This has not gotten worse recently. Angela Barr denies shortness of breath at rest or orthopnea.  Type 2 diabetes mellitus with hyperglycemia, with long-term current use of insulin ([name]). Angela Barr is taking Jardiance, Glipizide, metformin, and Ozempic. Fasting blood sugars are in the range of 120's to 130's. Last A1c 7.1 on 01/10/2020.  Lab Results  Component Value Date   HGBA1C 7.1 (A) 01/10/2020   HGBA1C 6.4 (A) 09/12/2019   HGBA1C 6.7 (A) 05/12/2019   Lab Results  Component Value Date   MICROALBUR 0.8 05/14/2018   LDLCALC 52 11/15/2019   CREATININE 0.55 11/15/2019   No results found for: INSULIN  Other hyperlipidemia. Angela Barr is taking Crestor. Lipids are controlled.   Lab Results  Component Value Date   CHOL 114 11/15/2019   HDL 49.40 11/15/2019   LDLCALC 52 11/15/2019   TRIG 60.0 11/15/2019   CHOLHDL 2 11/15/2019   Lab Results  Component Value Date   ALT 41 (H) 11/15/2019   AST 25 11/15/2019   ALKPHOS 73 11/15/2019   BILITOT 0.4 11/15/2019   The ASCVD Risk score Angela Barr., et  al., 2013) failed to calculate for the following reasons:   The valid total cholesterol range is 130 to 320 mg/dL  Vitamin D deficiency. Angela Barr is taking Vitamin D. Last Vitamin D 49.37 on 11/15/2019.  Elevated alanine aminotransferase (ALT) level. Angela Barr has a new dx of elevated ALT. She denies abdominal pain or jaundice and has never been told of any liver problems in the past. She denies excessive  alcohol intake. No ultrasound.  Lab Results  Component Value Date   ALT 41 (H) 11/15/2019   AST 25 11/15/2019   ALKPHOS 73 11/15/2019   BILITOT 0.4 11/15/2019   Depression screening. Angela Barr had a mildly positive depression screen with a PHQ-9 score of 5.  At risk for osteoporosis. [name] is at higher risk of osteopenia and osteoporosis due to Vitamin D deficiency.   Assessment/Plan:   Other fatigue. Angela Barr does feel that her weight is causing her energy to be lower than it should be. Fatigue may be related to obesity, depression or many other causes. Labs will be ordered, and in the meanwhile, [name] will focus on self care including making healthy food choices, increasing physical activity and focusing on stress reduction.   EKG 12-Lead, Vitamin B12, C-peptide  SOB (shortness of breath) on exertion. Angela Barr does not feel that she gets out of breath more easily that she used to when she exercises. Angela Barr's shortness of breath appears to be obesity related and exercise induced. She has agreed to work on weight loss and gradually increase exercise to treat her exercise induced shortness of breath. Will continue to monitor closely.  Type 2 diabetes mellitus with hyperglycemia, with long-term current use of insulin ([place]). Good blood sugar control is important to decrease the likelihood of diabetic complications such as nephropathy, neuropathy, limb loss, blindness, coronary artery disease, and death. Intensive lifestyle modification including diet, exercise and weight loss are the first line of treatment for diabetes. Angela Barr will continue her medications as directed. She will decrease Glipizide to 5 mg 1/2 twice daily (decreased from 1 daily).  Insulin, random, Microalbumin, urine ordered.  Other hyperlipidemia. Cardiovascular risk and specific lipid/LDL goals reviewed.  We discussed several lifestyle modifications today and Angela Barr will continue to work on diet, exercise and weight loss efforts.  Orders and follow up as documented in patient record. She will continue Crestor as directed.  Counseling Intensive lifestyle modifications are the first line treatment for this issue. . Dietary changes: Increase soluble fiber. Decrease simple carbohydrates. . Exercise changes: Moderate to vigorous-intensity aerobic activity 150 minutes per week if tolerated. . Lipid-lowering medications: see documented in medical record.  Vitamin D deficiency. Low Vitamin D level contributes to fatigue and are associated with obesity, breast, and colon cancer. She agrees to continue to take OTC Vitamin D and will follow-up for routine testing of Vitamin D, at least 2-3 times per year to avoid over-replacement.  Elevated alanine aminotransferase (ALT) level.  We discussed the likely diagnosis of non-alcoholic fatty liver disease today and how this condition is obesity related. Angela Barr was educated the importance of weight loss. Angela Barr agreed to continue with her weight loss efforts with healthier diet and exercise as an essential part of her treatment plan. Will follow over time. C-peptide ordered.  Depression screening. Angela Barr had a positive depression screening. Depression is commonly associated with obesity and often results in emotional eating behaviors. We will monitor this closely and work on CBT to help improve the non-hunger eating patterns. Referral to Psychology may be required if no improvement  is seen as she continues in our clinic.  At risk for osteoporosis. Angela Barr was given approximately 15 minutes of osteoporosis prevention counseling today. Angela Barr is at risk for osteopenia and osteoporosis due to her Vitamin D deficiency. She was encouraged to take her Vitamin D and follow her higher calcium diet and increase strengthening exercise to help strengthen her bones and decrease her risk of osteopenia and osteoporosis.  Repetitive spaced learning was employed today to elicit superior memory formation and  behavioral change.  Class 2 severe obesity with serious comorbidity and body mass index (BMI) of 38.0 to 38.9 in adult, unspecified obesity type (Angela Barr).  Angela Barr is currently in the action stage of change and her goal is to continue with weight loss efforts. I recommend Angela Barr begin the structured treatment plan as follows:  She has agreed to the Category 2 Plan.  She will work on meal planning and decrease sugary drinks.  We independently reviewed with the patient labs from 11/15/2019 including CMP, lipids, Vitamin D, CBC, and glucose. A1c 7.1 on 01/10/2020.  Exercise goals: All adults should avoid inactivity. Some physical activity is better than none, and adults who participate in any amount of physical activity gain some health benefits.   Behavioral modification strategies: increasing lean protein intake, decreasing simple carbohydrates, increasing vegetables, increasing water intake, decreasing eating out, no skipping meals, meal planning and cooking strategies, keeping healthy foods in the home and planning for success.  She was informed of the importance of frequent follow-up visits to maximize her success with intensive lifestyle modifications for her multiple health conditions. She was informed we would discuss her lab results at her next visit unless there is a critical issue that needs to be addressed sooner. Angela Barr agreed to keep her next visit at the agreed upon time to discuss these results.  Objective:   Blood pressure 115/81, pulse 87, temperature 98.2 F (36.8 C), height 5\' 4"  (1.626 m), weight 226 lb (102.5 kg), SpO2 97 %. Body mass index is 38.79 kg/m.  EKG: Sinus  Rhythm with a rate of 92 BPM. Low voltage in precordial leads. Otherwise normal.   Indirect Calorimeter completed today shows a VO2 of 226 and a REE of 1571.  Her calculated basal metabolic rate is 99991111 thus her basal metabolic rate is worse than expected.  General: Cooperative, alert, well developed, in no  acute distress. HEENT: Conjunctivae and lids unremarkable. Cardiovascular: Regular rhythm.  Lungs: Normal work of breathing. Neurologic: No focal deficits.   Lab Results  Component Value Date   CREATININE 0.55 11/15/2019   BUN 11 11/15/2019   NA 142 11/15/2019   K 3.8 11/15/2019   CL 106 11/15/2019   CO2 27 11/15/2019   Lab Results  Component Value Date   ALT 41 (H) 11/15/2019   AST 25 11/15/2019   ALKPHOS 73 11/15/2019   BILITOT 0.4 11/15/2019   Lab Results  Component Value Date   HGBA1C 7.1 (A) 01/10/2020   HGBA1C 6.4 (A) 09/12/2019   HGBA1C 6.7 (A) 05/12/2019   HGBA1C 7.2 (A) 01/19/2019   HGBA1C 6.6 (A) 09/17/2018   No results found for: INSULIN Lab Results  Component Value Date   TSH 0.43 11/15/2019   Lab Results  Component Value Date   CHOL 114 11/15/2019   HDL 49.40 11/15/2019   LDLCALC 52 11/15/2019   TRIG 60.0 11/15/2019   CHOLHDL 2 11/15/2019   Lab Results  Component Value Date   WBC 4.6 11/15/2019   HGB 13.3 11/15/2019  HCT 41.7 11/15/2019   MCV 80.7 11/15/2019   PLT 199.0 11/15/2019   Lab Results  Component Value Date   IRON 67 10/22/2017   TIBC 301 10/22/2017   FERRITIN 87 10/22/2017   Attestation Statements:   Reviewed by clinician on day of visit: allergies, medications, problem list, medical history, surgical history, family history, social history, and previous encounter notes.  Migdalia Dk, am acting as Location manager for [employer], DO   I have reviewed the above documentation for accuracy and completeness, and I agree with the above. Jearld Lesch, DO

## 2020-01-24 ENCOUNTER — Encounter (INDEPENDENT_AMBULATORY_CARE_PROVIDER_SITE_OTHER): Payer: Self-pay | Admitting: Bariatrics

## 2020-01-24 LAB — INSULIN, RANDOM: INSULIN: 20.4 u[IU]/mL (ref 2.6–24.9)

## 2020-01-24 LAB — C-PEPTIDE: C-Peptide: 3.3 ng/mL (ref 1.1–4.4)

## 2020-01-24 LAB — VITAMIN B12: Vitamin B-12: 503 pg/mL (ref 232–1245)

## 2020-01-24 LAB — MICROALBUMIN, URINE: Microalbumin, Urine: 12.1 ug/mL

## 2020-01-24 NOTE — Telephone Encounter (Signed)
Please review

## 2020-01-28 ENCOUNTER — Other Ambulatory Visit: Payer: Self-pay | Admitting: Internal Medicine

## 2020-02-01 ENCOUNTER — Encounter: Payer: Self-pay | Admitting: Internal Medicine

## 2020-02-06 ENCOUNTER — Encounter (INDEPENDENT_AMBULATORY_CARE_PROVIDER_SITE_OTHER): Payer: Self-pay | Admitting: Bariatrics

## 2020-02-06 ENCOUNTER — Ambulatory Visit (INDEPENDENT_AMBULATORY_CARE_PROVIDER_SITE_OTHER): Payer: 59 | Admitting: Bariatrics

## 2020-02-06 ENCOUNTER — Other Ambulatory Visit: Payer: Self-pay

## 2020-02-06 VITALS — BP 113/74 | HR 85 | Temp 98.2°F | Ht 64.0 in | Wt 224.0 lb

## 2020-02-06 DIAGNOSIS — E1169 Type 2 diabetes mellitus with other specified complication: Secondary | ICD-10-CM | POA: Diagnosis not present

## 2020-02-06 DIAGNOSIS — E785 Hyperlipidemia, unspecified: Secondary | ICD-10-CM

## 2020-02-06 DIAGNOSIS — E119 Type 2 diabetes mellitus without complications: Secondary | ICD-10-CM | POA: Diagnosis not present

## 2020-02-06 DIAGNOSIS — E1159 Type 2 diabetes mellitus with other circulatory complications: Secondary | ICD-10-CM

## 2020-02-06 DIAGNOSIS — Z6838 Body mass index (BMI) 38.0-38.9, adult: Secondary | ICD-10-CM

## 2020-02-06 DIAGNOSIS — I10 Essential (primary) hypertension: Secondary | ICD-10-CM

## 2020-02-07 ENCOUNTER — Encounter (INDEPENDENT_AMBULATORY_CARE_PROVIDER_SITE_OTHER): Payer: Self-pay | Admitting: Bariatrics

## 2020-02-07 NOTE — Progress Notes (Signed)
Chief Complaint:   OBESITY Angela Barr is here to discuss her progress with her obesity treatment plan along with follow-up of her obesity related diagnoses. Angela Barr is on the Category 2 Plan and states she is following her eating plan approximately 85-90% of the time. Angela Barr states she is walking 2-3 miles 4 times per week.  Today's visit was #: 2 Starting weight: 226 lbs Starting date: 01/23/2020 Today's weight: 224 lbs Today's date: 02/06/2020 Total lbs lost to date: 2 Total lbs lost since last in-office visit: 2  Interim History: Angela Barr estimates eating out 3 times a week. She also reports increased snacking and wants to know best snack options.  Subjective:   Hypertension associated with diabetes ([place]). Blood pressure is well controlled at today's office visit.  BP Readings from Last 3 Encounters:  02/06/20 113/74  01/23/20 115/81  01/10/20 118/80   Lab Results  Component Value Date   CREATININE 0.55 11/15/2019   CREATININE 0.54 06/03/2019   CREATININE 0.61 01/14/2019   Type 2 diabetes mellitus without complication, without long-term current use of insulin ([place]). Angela Barr is currently on metformin, glipizide, and semaglutide injectable. She stopped Jardiance due to the high cost. She reports postprandial blood sugars as low as 80's and felt "shaky." Since stopping Jardiance, fasting blood sugars have ranged between 110 and 120's.   Lab Results  Component Value Date   HGBA1C 7.1 (A) 01/10/2020   HGBA1C 6.4 (A) 09/12/2019   HGBA1C 6.7 (A) 05/12/2019   Lab Results  Component Value Date   MICROALBUR 0.8 05/14/2018   [place] 52 11/15/2019   CREATININE 0.55 11/15/2019   Lab Results  Component Value Date   INSULIN 20.4 01/23/2020   Hyperlipidemia associated with type 2 diabetes mellitus ([place]). Angela Barr is currently on rosuvastatin 20 mg daily and tolerating it well. Lipid panel in January 2021 was stable with LDL at goal.   Lab Results  Component Value Date   CHOL 114 11/15/2019   HDL 49.40 11/15/2019   LDLCALC 52 11/15/2019   TRIG 60.0 11/15/2019   CHOLHDL 2 11/15/2019   Lab Results  Component Value Date   ALT 41 (H) 11/15/2019   AST 25 11/15/2019   ALKPHOS 73 11/15/2019   BILITOT 0.4 11/15/2019   The ASCVD Risk score (Goff DC Jr., et al., 2013) failed to calculate for the following reasons:   The valid total cholesterol range is 130 to 320 mg/dL  Assessment/Plan:   Hypertension associated with diabetes ([place]). Angela Barr is working on healthy weight loss and exercise to improve blood pressure control. We will watch for signs of hypotension as she continues her lifestyle modifications. Angela Barr will continue her current anti-hypertensive therapy and her Category 2 meal plan.  Type 2 diabetes mellitus without complication, without long-term current use of insulin (Angela Barr). Good blood sugar control is important to decrease the likelihood of diabetic complications such as nephropathy, neuropathy, limb loss, blindness, coronary artery disease, and death. Intensive lifestyle modification including diet, exercise and weight loss are the first line of treatment for diabetes. If blood sugars continue to lower, then decrease her glipizide dose. We recommend obtaining glucose labs. She will continue her Category 2 meal plan.  Hyperlipidemia associated with type 2 diabetes mellitus (Angela Barr). Cardiovascular risk and specific lipid/LDL goals reviewed.  We discussed several lifestyle modifications today and Angela Barr will continue to work on diet, exercise and weight loss efforts. Orders and follow up as documented in patient record. She will continue her Category 2 meal plan.  Counseling Intensive lifestyle modifications are the first line treatment for this issue. . Dietary changes: Increase soluble fiber. Decrease simple carbohydrates. . Exercise changes: Moderate to vigorous-intensity aerobic activity 150 minutes per week if tolerated. . Lipid-lowering medications:  see documented in medical record.  Angela Barr is currently in the action stage of change. As such, her goal is to continue with weight loss efforts. She has agreed to the Category 2 Plan.   Exercise goals: Angela Barr will continue her current exercise regimen of walking and will slowly add in weight training.  Behavioral modification strategies: decreasing simple carbohydrates, decreasing eating out and better snacking choices.  Angela Barr has agreed to follow-up with our clinic in 4 weeks. She was informed of the importance of frequent follow-up visits to maximize her success with intensive lifestyle modifications for her multiple health conditions.   Objective:   Blood pressure 113/74, pulse 85, temperature 98.2 F (36.8 C), height 5\' 4"  (1.626 m), weight 224 lb (101.6 kg), SpO2 100 %. Body mass index is 38.45 kg/m.  General: Cooperative, alert, well developed, in no acute distress. HEENT: Conjunctivae and lids unremarkable. Cardiovascular: Regular rhythm.  Lungs: Normal work of breathing. Neurologic: No focal deficits.   Lab Results  Component Value Date   CREATININE 0.55 11/15/2019   BUN 11 11/15/2019   NA 142 11/15/2019   K 3.8 11/15/2019   CL 106 11/15/2019   CO2 27 11/15/2019   Lab Results  Component Value Date   ALT 41 (H) 11/15/2019   AST 25 11/15/2019   ALKPHOS 73 11/15/2019   BILITOT 0.4 11/15/2019   Lab Results  Component Value Date   HGBA1C 7.1 (A) 01/10/2020   HGBA1C 6.4 (A) 09/12/2019   HGBA1C 6.7 (A) 05/12/2019   HGBA1C 7.2 (A) 01/19/2019   HGBA1C 6.6 (A) 09/17/2018   Lab Results  Component Value Date   INSULIN 20.4 01/23/2020   Lab Results  Component Value Date   TSH 0.43 11/15/2019   Lab Results  Component Value Date   CHOL 114 11/15/2019   HDL 49.40 11/15/2019   LDLCALC 52 11/15/2019   TRIG 60.0 11/15/2019   CHOLHDL 2 11/15/2019   Lab Results  Component Value Date   WBC 4.6 11/15/2019   HGB 13.3 11/15/2019   HCT 41.7 11/15/2019   MCV 80.7  11/15/2019   PLT 199.0 11/15/2019   Lab Results  Component Value Date   IRON 67 10/22/2017   TIBC 301 10/22/2017   FERRITIN 87 10/22/2017   Attestation Statements:   Reviewed by clinician on day of visit: allergies, medications, problem list, medical history, surgical history, family history, social history, and previous encounter notes.  Time spent on visit including pre-visit chart review and post-visit charting and care was 39 minutes.   Migdalia Dk, am acting as Location manager for [employer], DO   I have reviewed the above documentation for accuracy and completeness, and I agree with the above. Jearld Lesch, DO

## 2020-02-12 ENCOUNTER — Encounter: Payer: Self-pay | Admitting: Family Medicine

## 2020-02-15 NOTE — Telephone Encounter (Signed)
I can not say she is allergic to the vaccine Only that she is uncomfortable with the swollen lymph nodes.   If no sob, chest pain, rash etc we can not say she is allergic  Was she seen by [facility] or any physician with any reaction she did have?

## 2020-02-15 NOTE — Telephone Encounter (Signed)
Pt called to say she never got a response back from previous message. Patient okay to speak to [facility] if need be. Thanks

## 2020-02-15 NOTE — Telephone Encounter (Signed)
Please advise 

## 2020-02-22 ENCOUNTER — Other Ambulatory Visit: Payer: Self-pay

## 2020-02-22 ENCOUNTER — Ambulatory Visit: Payer: 59 | Admitting: Family Medicine

## 2020-02-22 ENCOUNTER — Encounter: Payer: Self-pay | Admitting: Family Medicine

## 2020-02-22 VITALS — BP 120/84 | HR 86 | Temp 96.6°F | Ht 64.5 in | Wt 226.1 lb

## 2020-02-22 DIAGNOSIS — Z23 Encounter for immunization: Secondary | ICD-10-CM | POA: Diagnosis not present

## 2020-02-22 DIAGNOSIS — Z7189 Other specified counseling: Secondary | ICD-10-CM

## 2020-02-22 DIAGNOSIS — Z7185 Encounter for immunization safety counseling: Secondary | ICD-10-CM

## 2020-02-22 NOTE — Telephone Encounter (Signed)
Verbal to approve vaccine? Please advise

## 2020-02-22 NOTE — Progress Notes (Signed)
Chief Complaint  Patient presents with  . Follow-up    discuss vaccine    Subjective: Patient is a 59 y.o. female here for discussion of vaccine.  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  She did not have an adequate antibody response for varicella.  She is here to discuss whether she needs a varicella vaccination.  She reports never having it as a child.  When she got the tetanus shot in the Covid vaccine, she had regional lymphadenopathy.  She is worried about this reaction.  Past Medical History:  Diagnosis Date  . Arthritis    Hips   . Colon polyps 2008   [name]  . Diabetes mellitus 2006  . Hyperlipemia 2012  . Hypertension 2012   off meds at 01-08-18 [facility]   . Obesity   . Palpitations     Objective: BP 120/84 (BP Location: Right Arm, Patient Position: Sitting, Cuff Size: Normal)   Pulse 86   Temp (!) 96.6 F (35.9 C) (Temporal)   Ht 5' 4.5" (1.638 m)   Wt 226 lb 2 oz (102.6 kg)   SpO2 96%   BMI 38.21 kg/m  General: Awake, appears stated age Lungs: No accessory muscle use Psych: Age appropriate judgment and insight, normal affect and mood  Assessment and Plan: Vaccine counseling  Need for varicella vaccine - Plan: Varicella vaccine subcutaneous  Will need to get second varicella vaccine in 4-8 weeks and titre reck 2 weeks after completion of series.  Reiterated that the lymphadenopathy is a vaccine adverse reaction rather than a specific weakness in her immune system. F/u w reg pcp as originally scheduled otherwise.  The patient voiced understanding and agreement to the plan.  [place], DO 02/22/20  9:56 AM

## 2020-02-22 NOTE — Telephone Encounter (Signed)
yes

## 2020-02-22 NOTE — Patient Instructions (Signed)
We officially recommend a 2 shot series separated by at least 4 weeks of time. We can recheck a titre (if needed) 2 weeks after the series is complete.   Let us know if you need anything.

## 2020-02-28 ENCOUNTER — Telehealth: Payer: Self-pay

## 2020-02-28 ENCOUNTER — Other Ambulatory Visit: Payer: Self-pay

## 2020-02-28 MED ORDER — OZEMPIC (0.25 OR 0.5 MG/DOSE) 2 MG/1.5ML ~~LOC~~ SOPN: 0.5000 mg | PEN_INJECTOR | SUBCUTANEOUS | 3 refills | Status: DC

## 2020-02-28 NOTE — Telephone Encounter (Signed)
Prior authorization for Ozempic has been approved by patient's insurance.  Coverage is effective 10/28/2019 to 01/24/2022  PA Approval/Reference Number [id number]  Approval letter has been sent to scanning.

## 2020-03-03 ENCOUNTER — Other Ambulatory Visit: Payer: Self-pay | Admitting: Internal Medicine

## 2020-03-03 DIAGNOSIS — E0822 Diabetes mellitus due to underlying condition with diabetic chronic kidney disease: Secondary | ICD-10-CM

## 2020-03-06 ENCOUNTER — Ambulatory Visit (INDEPENDENT_AMBULATORY_CARE_PROVIDER_SITE_OTHER): Payer: 59 | Admitting: Bariatrics

## 2020-03-07 ENCOUNTER — Ambulatory Visit (INDEPENDENT_AMBULATORY_CARE_PROVIDER_SITE_OTHER): Payer: 59 | Admitting: Family Medicine

## 2020-03-09 ENCOUNTER — Ambulatory Visit: Payer: 59 | Admitting: Family

## 2020-03-09 ENCOUNTER — Other Ambulatory Visit: Payer: Self-pay

## 2020-03-09 ENCOUNTER — Encounter: Payer: Self-pay | Admitting: Gastroenterology

## 2020-03-09 ENCOUNTER — Encounter: Payer: Self-pay | Admitting: Family

## 2020-03-09 VITALS — BP 145/76 | HR 98 | Temp 97.2°F | Resp 16 | Ht 64.5 in | Wt 222.0 lb

## 2020-03-09 DIAGNOSIS — K625 Hemorrhage of anus and rectum: Secondary | ICD-10-CM

## 2020-03-09 LAB — CBC WITH DIFFERENTIAL/PLATELET
Basophils Absolute: 0.1 10*3/uL (ref 0.0–0.1)
Basophils Relative: 0.7 % (ref 0.0–3.0)
Eosinophils Absolute: 0.1 10*3/uL (ref 0.0–0.7)
Eosinophils Relative: 1.1 % (ref 0.0–5.0)
HCT: 40.2 % (ref 36.0–46.0)
Hemoglobin: 13.1 g/dL (ref 12.0–15.0)
Lymphocytes Relative: 32.6 % (ref 12.0–46.0)
Lymphs Abs: 2.3 10*3/uL (ref 0.7–4.0)
MCHC: 32.5 g/dL (ref 30.0–36.0)
MCV: 80.5 fl (ref 78.0–100.0)
Monocytes Absolute: 0.4 10*3/uL (ref 0.1–1.0)
Monocytes Relative: 6.2 % (ref 3.0–12.0)
Neutro Abs: 4.2 10*3/uL (ref 1.4–7.7)
Neutrophils Relative %: 59.4 % (ref 43.0–77.0)
Platelets: 218 10*3/uL (ref 150.0–400.0)
RBC: 5 Mil/uL (ref 3.87–5.11)
RDW: 15.8 % — ABNORMAL HIGH (ref 11.5–15.5)
WBC: 7 10*3/uL (ref 4.0–10.5)

## 2020-03-09 NOTE — Patient Instructions (Addendum)
Please complete lab work prior to leaving. You should be contacted about your referral to GI. Please go to the ER if you develop severe rectal bleeding.    Rectal Bleeding  Rectal bleeding is when blood passes out of the anus. People with rectal bleeding may notice bright red blood in their underwear or in the toilet after having a bowel movement. They may also have dark red or black stools. Rectal bleeding is usually a sign that something is wrong. Many things can cause rectal bleeding, including:  Hemorrhoids. These are blood vessels in the anus or rectum that are larger than normal.  Fistulas. These are abnormal passages in the rectum and anus.  Anal fissures. This is a tear in the anus.  Diverticulosis. This is a condition in which pockets or sacs project from the bowel.  Proctitis and colitis. These are conditions in which the rectum, colon, or anus become inflamed.  Polyps. These are growths that can be cancerous (malignant) or non-cancerous (benign).  Part of the rectum sticking out from the anus (rectal prolapse).  Certain medicines.  Intestinal infections. Follow these instructions at home: Pay attention to any changes in your symptoms. Take these actions to help lessen bleeding and discomfort:  Eat a diet that is high in fiber. This will keep your stool soft, making it easier to pass stools without straining. Ask your health care provider what foods and drinks are high in fiber.  Drink enough fluid to keep your urine clear or pale yellow. This also helps to keep your stool soft.  Try taking a warm bath. This may help soothe any pain in your rectum.  Keep all follow-up visits as told by your health care provider. This is important. Get help right away if:  You have new or increased rectal bleeding.  You have black or dark red stools.  You vomit blood or something that looks like coffee grounds.  You have pain or tenderness in your abdomen.  You have a  fever.  You feel weak.  You feel nauseous.  You faint.  You have severe pain in your rectum.  You cannot have a bowel movement. This information is not intended to replace advice given to you by your health care provider. Make sure you discuss any questions you have with your health care provider. Document Revised: 06/05/2016 Document Reviewed: 12/09/2015 Elsevier Patient Education  2020 [employer].

## 2020-03-09 NOTE — Progress Notes (Signed)
Subjective:    Patient ID: Angela Barr, female    DOB: [birth year], 59 y.o.   MRN: [id number]  HPI  Patient is a 59 yr old female who presents today with chief complaint of rectal bleeding. Notes that this occurred twice yesterday.  She reports that her last colonoscopy was 3-5 yrs ago, reports that she is due for follow up.  .  She reports that she did have 2 red velvet donut holes. Not on the tissue and toilet. Reports remote history of rectal fissure. Denies pain or obvious hemorrhoids. Denies constipation- stools are soft due to taking metformin.  She denies SOB, black or tarry stools.   Review of Systems See HPI  Past Medical History:  Diagnosis Date  . Arthritis    Hips   . Colon polyps 2008   [name]  . Diabetes mellitus 2006  . Hyperlipemia 2012  . Hypertension 2012   off meds at 01-08-18 [facility]   . Obesity   . Palpitations      Social History   Socioeconomic History  . Marital status: Married    Spouse name: Jenny Reichmann  . Number of children: 3  . Years of education: Not on file  . Highest education level: Not on file  Occupational History  . Occupation: Therapist, sports  Tobacco Use  . Smoking status: Never Smoker  . Smokeless tobacco: Never Used  Substance and Sexual Activity  . Alcohol use: No  . Drug use: No  . Sexual activity: Yes    Partners: Male    Birth control/protection: Surgical  Other Topics Concern  . Not on file  Social History Narrative   3 caffeine drinks daily.   Exercise-no   Lives at home with husband    Social Determinants of Radio broadcast assistant Strain:   . Difficulty of Paying Living Expenses:   Food Insecurity:   . Worried About Charity fundraiser in the Last Year:   . Arboriculturist in the Last Year:   Transportation Needs:   . Film/video editor (Medical):   Marland Kitchen Lack of Transportation (Non-Medical):   Physical Activity:   . Days of Exercise per Week:   . Minutes of Exercise per Session:   Stress:   . Feeling of  Stress :   Social Connections:   . Frequency of Communication with Friends and Family:   . Frequency of Social Gatherings with Friends and Family:   . Attends Religious Services:   . Active Member of Clubs or Organizations:   . Attends Archivist Meetings:   Marland Kitchen Marital Status:   Intimate Partner Violence:   . Fear of Current or Ex-Partner:   . Emotionally Abused:   Marland Kitchen Physically Abused:   . Sexually Abused:     Past Surgical History:  Procedure Laterality Date  . BREAST BIOPSY  2003  . COLONOSCOPY    . POLYPECTOMY    . TUBAL LIGATION  1990  . VAGINAL HYSTERECTOMY  2006  . WISDOM TOOTH EXTRACTION      Family History  Problem Relation Age of Onset  . Heart failure Father   . Hypertension Father   . Colon cancer Father 57  . Diabetes Father   . Heart disease Father   . Sudden death Father   . Obesity Father   . Hypertension Mother   . High Cholesterol Mother   . Stroke Mother   . Breast cancer Maternal Grandmother   . Diabetes Paternal Grandmother   .  Colon polyps Neg Hx   . Esophageal cancer Neg Hx   . Rectal cancer Neg Hx   . Stomach cancer Neg Hx     Allergies  Allergen Reactions  . Bactrim Nausea Only  . Sulfa Antibiotics Nausea And Vomiting  . Sulfamethoxazole-Trimethoprim     Current Outpatient Medications on File Prior to Visit  Medication Sig Dispense Refill  . glipiZIDE (GLUCOTROL) 5 MG tablet Take 5 mg by mouth 2 (two) times daily before a meal.    . Glucos-Chond-Hyal Ac-Ca Fructo (MOVE FREE JOINT HEALTH ADVANCE) TABS Take 1 tablet by mouth daily.    Marland Kitchen JARDIANCE 25 MG TABS tablet TAKE 1 TABLET BY MOUTH DAILY 30 tablet 11  . losartan (COZAAR) 100 MG tablet Take 1 tablet (100 mg total) by mouth daily. 90 tablet 3  . metFORMIN (GLUCOPHAGE-XR) 500 MG 24 hr tablet TAKE 1 TABLET BY MOUTH EVERY MORNING AND 2 TABLETS WITH DINNER 270 tablet 1  . Multiple Vitamin (MULTIVITAMIN ADULT PO) Take by mouth.    [name] VERIO test strip CHECK YOUR BLOOD  SUGAR TWICE DAILY 100 strip 11  . OZEMPIC, 0.25 OR 0.5 MG/DOSE, 2 MG/1.5ML SOPN Inject 0.5 mg into the skin once a week. 3 pen 3  . rosuvastatin (CRESTOR) 20 MG tablet Take 1 tablet (20 mg total) by mouth at bedtime. 90 tablet 1   No current facility-administered medications on file prior to visit.    BP (!) 145/76 (BP Location: Right Arm, Patient Position: Sitting, Cuff Size: Large)   Pulse 98   Temp (!) 97.2 F (36.2 C) (Temporal)   Resp 16   Ht 5' 4.5" (1.638 m)   Wt 222 lb (100.7 kg)   SpO2 100%   BMI 37.52 kg/m       Objective:   Physical Exam Constitutional:      Appearance: She is well-developed.  Neck:     Thyroid: No thyromegaly.  Cardiovascular:     Rate and Rhythm: Normal rate and regular rhythm.     Heart sounds: Normal heart sounds. No murmur.  Pulmonary:     Effort: Pulmonary effort is normal. No respiratory distress.     Breath sounds: Normal breath sounds. No wheezing.  Musculoskeletal:     Cervical back: Neck supple.  Skin:    General: Skin is warm and dry.  Neurological:     Mental Status: She is alert and oriented to person, place, and time.  Psychiatric:        Behavior: Behavior normal.        Thought Content: Thought content normal.        Judgment: Judgment normal.   rectal:  Fair rectal tone, some small external hemorrhoids noted, normal internal exam. Small stool specimen obtained and was heme negative        Assessment & Plan:  Rectal bleeding- heme negative on exam today. Will check CBC, refer to GI for consult and repeat colonoscopy.  She is advised to go to the ER if severe/worsening bleeding.  This visit occurred during the SARS-CoV-2 public health emergency.  Safety protocols were in place, including screening questions prior to the visit, additional usage of staff PPE, and extensive cleaning of exam room while observing appropriate contact time as indicated for disinfecting solutions.

## 2020-03-14 NOTE — Addendum Note (Signed)
Addended by: Debbrah Alar on: 03/14/2020 07:11 AM   Modules accepted: Level of Service

## 2020-03-15 ENCOUNTER — Encounter: Payer: Self-pay | Admitting: Internal Medicine

## 2020-03-15 ENCOUNTER — Ambulatory Visit: Payer: 59 | Admitting: Internal Medicine

## 2020-03-15 ENCOUNTER — Other Ambulatory Visit: Payer: Self-pay

## 2020-03-15 VITALS — BP 140/90 | HR 101 | Temp 98.9°F | Ht 64.5 in | Wt 228.0 lb

## 2020-03-15 DIAGNOSIS — E1165 Type 2 diabetes mellitus with hyperglycemia: Secondary | ICD-10-CM | POA: Diagnosis not present

## 2020-03-15 DIAGNOSIS — I10 Essential (primary) hypertension: Secondary | ICD-10-CM | POA: Diagnosis not present

## 2020-03-15 DIAGNOSIS — M545 Low back pain, unspecified: Secondary | ICD-10-CM

## 2020-03-15 DIAGNOSIS — M79604 Pain in right leg: Secondary | ICD-10-CM | POA: Diagnosis not present

## 2020-03-15 DIAGNOSIS — R1031 Right lower quadrant pain: Secondary | ICD-10-CM

## 2020-03-15 MED ORDER — CYCLOBENZAPRINE HCL 5 MG PO TABS
5.0000 mg | ORAL_TABLET | Freq: Three times a day (TID) | ORAL | 1 refills | Status: DC | PRN
Start: 2020-03-15 — End: 2020-06-04

## 2020-03-15 MED ORDER — TRAMADOL HCL 50 MG PO TABS: 50.0000 mg | ORAL_TABLET | Freq: Four times a day (QID) | ORAL | 0 refills | Status: DC | PRN

## 2020-03-15 NOTE — Progress Notes (Signed)
Subjective:    Patient ID: Angela Barr, female    DOB: 11-Nov-1960, 59 y.o.   MRN: [id number]  HPI  Here with 1 wk onset acute on chronic right low back pain, dull and sharp, constant, mod to severe, worse to stand up or walk, better to sit or lie down, tylenol arthritis no help.  Also with c/o 3-4 days worsening right groin pain, not sure if related, intermittent, moderate, no radiation and no worsening distal leg pain, weakness or numbness, but has to pick up the let to get into the car. Denies worsening reflux, abd pain, dysphagia, n/v, bowel change or blood.  Denies urinary symptoms such as dysuria, frequency, urgency, flank pain, hematuria or n/v, fever, chills.  Pt denies chest pain, increased sob or doe, wheezing, orthopnea, PND, increased LE swelling, palpitations, dizziness or syncope.   Pt denies polydipsia, polyuria Past Medical History:  Diagnosis Date  . Arthritis    Hips   . Colon polyps 2008   [name]  . Diabetes mellitus 2006  . Hyperlipemia 2012  . Hypertension 2012   off meds at 01-08-18 [facility]   . Obesity   . Palpitations    Past Surgical History:  Procedure Laterality Date  . BREAST BIOPSY  2003  . COLONOSCOPY    . POLYPECTOMY    . TUBAL LIGATION  1990  . VAGINAL HYSTERECTOMY  2006  . WISDOM TOOTH EXTRACTION      reports that she has never smoked. She has never used smokeless tobacco. She reports that she does not drink alcohol or use drugs. family history includes Breast cancer in her maternal grandmother; Colon cancer (age of onset: 52) in her father; Diabetes in her father and paternal grandmother; Heart disease in her father; Heart failure in her father; High Cholesterol in her mother; Hypertension in her father and mother; Obesity in her father; Stroke in her mother; Sudden death in her father. Allergies  Allergen Reactions  . Bactrim Nausea Only  . Sulfa Antibiotics Nausea And Vomiting  . Sulfamethoxazole-Trimethoprim    Current Outpatient  Medications on File Prior to Visit  Medication Sig Dispense Refill  . glipiZIDE (GLUCOTROL) 5 MG tablet Take 5 mg by mouth 2 (two) times daily before a meal.    . Glucos-Chond-Hyal Ac-Ca Fructo (MOVE FREE JOINT HEALTH ADVANCE) TABS Take 1 tablet by mouth daily.    Marland Kitchen JARDIANCE 25 MG TABS tablet TAKE 1 TABLET BY MOUTH DAILY 30 tablet 11  . losartan (COZAAR) 100 MG tablet Take 1 tablet (100 mg total) by mouth daily. 90 tablet 3  . metFORMIN (GLUCOPHAGE-XR) 500 MG 24 hr tablet TAKE 1 TABLET BY MOUTH EVERY MORNING AND 2 TABLETS WITH DINNER 270 tablet 1  . Multiple Vitamin (MULTIVITAMIN ADULT PO) Take by mouth.    [name] VERIO test strip CHECK YOUR BLOOD SUGAR TWICE DAILY 100 strip 11  . OZEMPIC, 0.25 OR 0.5 MG/DOSE, 2 MG/1.5ML SOPN Inject 0.5 mg into the skin once a week. 3 pen 3  . rosuvastatin (CRESTOR) 20 MG tablet Take 1 tablet (20 mg total) by mouth at bedtime. 90 tablet 1   No current facility-administered medications on file prior to visit.   Review of Systems All otherwise neg per pt     Objective:   Physical Exam BP 140/90 (BP Location: Left Arm, Patient Position: Sitting, Cuff Size: Large)   Pulse (!) 101   Temp 98.9 F (37.2 C) (Oral)   Ht 5' 4.5" (1.638 m)   Wt  228 lb (103.4 kg)   SpO2 98%   BMI 38.53 kg/m  VS noted,  Constitutional: Pt appears in NAD HENT: Head: NCAT.  Right Ear: External ear normal.  Left Ear: External ear normal.  Eyes: . Pupils are equal, round, and reactive to light. Conjunctivae and EOM are normal Nose: without d/c or deformity Neck: Neck supple. Gross normal ROM Cardiovascular: Normal rate and regular rhythm.   Pulmonary/Chest: Effort normal and breath sounds without rales or wheezing.  Abd:  Soft, NT, ND, + BS, no organomegaly Spine nontender; + tender right lumbar spasm, and right upper medial leg mild tender without swelling, mass; hip FROM with pain to internal rotation Neurological: Pt is alert. At baseline orientation, motor grossly  intact Skin: Skin is warm. No rashes, other new lesions, no LE edema Psychiatric: Pt behavior is normal without agitation  All otherwise neg per pt Lab Results  Component Value Date   WBC 7.0 03/09/2020   HGB 13.1 03/09/2020   HCT 40.2 03/09/2020   PLT 218.0 03/09/2020   GLUCOSE 95 11/15/2019   CHOL 114 11/15/2019   TRIG 60.0 11/15/2019   HDL 49.40 11/15/2019   LDLCALC 52 11/15/2019   ALT 41 (H) 11/15/2019   AST 25 11/15/2019   NA 142 11/15/2019   K 3.8 11/15/2019   CL 106 11/15/2019   CREATININE 0.55 11/15/2019   BUN 11 11/15/2019   CO2 27 11/15/2019   TSH 0.43 11/15/2019   HGBA1C 7.1 (A) 01/10/2020   MICROALBUR 0.8 05/14/2018      Assessment & Plan:

## 2020-03-15 NOTE — Patient Instructions (Signed)
Please take all new medication as prescribed - the pain medication, and muscle relaxer as needed  Please continue all other medications as before, and refills have been done if requested.  Please have the pharmacy call with any other refills you may need.  Please keep your appointments with your specialists as you may have planned  Please go to the XRAY Department in the first floor for the x-ray testing - at the [place] site  Please consider follow up with Sports Medicine in this building if not improved in 1 week

## 2020-03-16 ENCOUNTER — Other Ambulatory Visit: Payer: Self-pay | Admitting: Internal Medicine

## 2020-03-16 ENCOUNTER — Ambulatory Visit (INDEPENDENT_AMBULATORY_CARE_PROVIDER_SITE_OTHER)
Admission: RE | Admit: 2020-03-16 | Discharge: 2020-03-16 | Disposition: A | Discharge status: Home / Self Care | Payer: 59 | Source: Ambulatory Visit | Attending: Internal Medicine | Admitting: Internal Medicine

## 2020-03-16 DIAGNOSIS — M545 Low back pain, unspecified: Secondary | ICD-10-CM

## 2020-03-16 DIAGNOSIS — R1031 Right lower quadrant pain: Secondary | ICD-10-CM

## 2020-03-17 ENCOUNTER — Encounter: Payer: Self-pay | Admitting: Internal Medicine

## 2020-03-17 DIAGNOSIS — M545 Low back pain, unspecified: Secondary | ICD-10-CM | POA: Insufficient documentation

## 2020-03-17 DIAGNOSIS — M79606 Pain in leg, unspecified: Secondary | ICD-10-CM | POA: Insufficient documentation

## 2020-03-17 NOTE — Assessment & Plan Note (Addendum)
C/w msk spasm, for tramadol, flexeril prn,  to f/u any worsening symptoms or concerns  I spent 31 minutes in preparing to see the patient by review of recent labs, imaging and procedures, obtaining and reviewing separately obtained history, communicating with the patient and family or caregiver, ordering medications, tests or procedures, and documenting clinical information in the EHR including the differential Dx, treatment, and any further evaluation and other management of low back pain, right leg pain, htn, dm

## 2020-03-17 NOTE — Assessment & Plan Note (Signed)
Lab Results  Component Value Date   HGBA1C 7.1 (A) 01/10/2020  stable overall by history and exam, recent data reviewed with pt, and pt to continue medical treatment as before,  to f/u any worsening symptoms or concerns

## 2020-03-17 NOTE — Assessment & Plan Note (Signed)
stable overall by history and exam, recent data reviewed with pt, and pt to continue medical treatment as before,  to f/u any worsening symptoms or concerns  

## 2020-03-17 NOTE — Assessment & Plan Note (Signed)
C/w likely msk strain, for tramadol, flexeril and consider sport med referral

## 2020-03-21 ENCOUNTER — Ambulatory Visit: Payer: 59

## 2020-04-19 ENCOUNTER — Ambulatory Visit: Payer: 59 | Admitting: Gastroenterology

## 2020-04-19 VITALS — BP 122/74 | HR 81 | Temp 96.8°F | Ht 64.5 in | Wt 220.5 lb

## 2020-04-19 DIAGNOSIS — Z800 Family history of malignant neoplasm of digestive organs: Secondary | ICD-10-CM | POA: Diagnosis not present

## 2020-04-19 DIAGNOSIS — Z86010 Personal history of colonic polyps: Secondary | ICD-10-CM | POA: Diagnosis not present

## 2020-04-19 DIAGNOSIS — Z01818 Encounter for other preprocedural examination: Secondary | ICD-10-CM

## 2020-04-19 DIAGNOSIS — Z8371 Family history of colonic polyps: Secondary | ICD-10-CM

## 2020-04-19 MED ORDER — SUTAB 1479-225-188 MG PO TABS: 1.0000 | ORAL_TABLET | ORAL | 0 refills | Status: DC

## 2020-04-19 NOTE — Progress Notes (Addendum)
Chief Complaint:   Referring Provider:  Debbrah Alar, NP      ASSESSMENT AND PLAN;   #1. Rectal bleeding (resolved)  #2. FH colon cancer (dad at age 58)  #3. H/O tubular adenomas 06/2011  Plan:   - Proceed with colonoscopy with sutabs. Discussed risks & benefits. (Risks including rare perforation req laparotomy, bleeding after bx/polypectomy req blood transfusion, rarely missing neoplasms, risks of anesthesia/sedation). Benefits outweigh the risks. Patient agrees to proceed. All the questions were answered. Consent forms given for review. - Continue high-fiber diet.  Increase water intake. - Call us if with any problems until then.    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 Nl CBC.  Mostly was bright red in color.  Attributed to hemorrhoids.  No rectal pain.  No nausea, vomiting, heartburn, regurgitation, odynophagia or dysphagia.  No significant diarrhea or constipation. No melena or hematochezia. No unintentional weight loss. No abdominal pain.   Past GI procedures: -Colonoscopy 06/2011 2 to 3 mm cecal polyp s/p polypectomy.  Biopsies tubular adenoma.  Repeat in 5 years.  Also had history of polyps in 2008.  SH- works at [employer] Past Medical History:  Diagnosis Date  . Arthritis    Hips   . Colon polyps 2008   [name]  . Diabetes mellitus 2006  . Hyperlipemia 2012  . Hypertension 2012   off meds at 01-08-18 PV   . Obesity   . Palpitations     Past Surgical History:  Procedure Laterality Date  . BREAST BIOPSY  2003  . COLONOSCOPY  07/07/2011  . POLYPECTOMY    . TUBAL LIGATION  1990  . VAGINAL HYSTERECTOMY  2006  . WISDOM TOOTH EXTRACTION      Family History  Problem Relation Age of Onset  . Heart failure Father   . Hypertension Father   . Colon  cancer Father 1  . Diabetes Father   . Heart disease Father   . Sudden death Father   . Obesity Father   . Hypertension Mother   . High Cholesterol Mother   . Stroke Mother   . Breast cancer Maternal Grandmother   . Diabetes Paternal Grandmother   . Colon polyps Neg Hx   . Esophageal cancer Neg Hx   . Rectal cancer Neg Hx   . Stomach cancer Neg Hx     Social History   Tobacco Use  . Smoking status: Never Smoker  . Smokeless tobacco: Never Used  Vaping Use  . Vaping Use: Never used  Substance Use Topics  . Alcohol use: No  . Drug use: No    Current Outpatient Medications  Medication Sig Dispense Refill  . Cholecalciferol (VITAMIN D-3) 25 MCG (1000 UT) CAPS Take 1 capsule by mouth daily.    Marland Kitchen glipiZIDE (GLUCOTROL) 5 MG tablet Take 5 mg by mouth 2 (two) times daily before a meal.    . Glucos-Chond-Hyal Ac-Ca Fructo (MOVE FREE JOINT HEALTH ADVANCE) TABS Take 1 tablet by mouth daily. (Patient taking differently: Take 1 tablet by mouth daily as needed. )    . losartan (COZAAR) 100 MG tablet Take 1 tablet (100 mg total) by mouth daily. 90 tablet 3  . metFORMIN (GLUCOPHAGE-XR) 500 MG 24 hr tablet TAKE 1 TABLET BY MOUTH EVERY MORNING AND 2  TABLETS WITH DINNER 270 tablet 1  . Multiple Vitamin (MULTIVITAMIN ADULT PO) Take by mouth.    [name] VERIO test strip CHECK YOUR BLOOD SUGAR TWICE DAILY 100 strip 11  . OZEMPIC, 0.25 OR 0.5 MG/DOSE, 2 MG/1.5ML SOPN Inject 0.5 mg into the skin once a week. 3 pen 3  . rosuvastatin (CRESTOR) 20 MG tablet Take 1 tablet (20 mg total) by mouth at bedtime. 90 tablet 1  . traMADol (ULTRAM) 50 MG tablet Take 1 tablet (50 mg total) by mouth every 6 (six) hours as needed. 30 tablet 0  . cyclobenzaprine (FLEXERIL) 5 MG tablet Take 1 tablet (5 mg total) by mouth 3 (three) times daily as needed for muscle spasms. (Patient not taking: Reported on 04/19/2020) 30 tablet 1   No current facility-administered medications for this visit.    Allergies  Allergen  Reactions  . Bactrim Nausea Only  . Sulfa Antibiotics Nausea And Vomiting  . Sulfamethoxazole-Trimethoprim     Review of Systems:  Constitutional: Denies fever, chills, diaphoresis, appetite change and fatigue.  HEENT: Denies photophobia, eye pain, redness, hearing loss, ear pain, congestion, sore throat, rhinorrhea, sneezing, mouth sores, neck pain, neck stiffness and tinnitus.   Respiratory: Denies SOB, DOE, cough, chest tightness,  and wheezing.   Cardiovascular: Denies chest pain, palpitations and leg swelling.  Genitourinary: Denies dysuria, urgency, frequency, hematuria, flank pain and difficulty urinating.  Musculoskeletal: Denies myalgias, back pain, joint swelling, arthralgias and gait problem.  Skin: No rash.  Neurological: Denies dizziness, seizures, syncope, weakness, light-headedness, numbness and headaches.  Hematological: Denies adenopathy. Easy bruising, personal or family bleeding history  Psychiatric/Behavioral: No anxiety or depression     Physical Exam:    BP 122/74   Pulse 81   Temp (!) 96.8 F (36 C)   Ht 5' 4.5" (1.638 m)   Wt 220 lb 8 oz (100 kg)   BMI 37.26 kg/m  Wt Readings from Last 3 Encounters:  04/19/20 220 lb 8 oz (100 kg)  03/15/20 228 lb (103.4 kg)  03/09/20 222 lb (100.7 kg)   Constitutional:  Well-developed, in no acute distress. Psychiatric: Normal mood and affect. Behavior is normal. HEENT: Pupils normal.  Conjunctivae are normal. No scleral icterus. Cardiovascular: Normal rate, regular rhythm. No edema Pulmonary/chest: Effort normal and breath sounds normal. No wheezing, rales or rhonchi. Abdominal: Soft, nondistended. Nontender. Bowel sounds active throughout. There are no masses palpable. No hepatomegaly. Rectal: To be performed at the time of colonoscopy. Neurological: Alert and oriented to person place and time. Skin: Skin is warm and dry. No rashes noted.  Data Reviewed: I have personally reviewed following labs and imaging  studies  CBC: CBC Latest Ref Rng & Units 03/09/2020 11/15/2019 06/03/2019  WBC 4.0 - 10.5 K/uL 7.0 4.6 5.8  Hemoglobin 12.0 - 15.0 g/dL 13.1 13.3 13.8  Hematocrit 36 - 46 % 40.2 41.7 43.6  Platelets 150 - 400 K/uL 218.0 199.0 215.0    CMP: CMP Latest Ref Rng & Units 11/15/2019 06/03/2019 01/14/2019  Glucose 70 - 99 mg/dL 95 70 137(H)  BUN 6 - 23 mg/dL 11 15 15   Creatinine 0.40 - 1.20 mg/dL 0.55 0.54 0.61  Sodium 135 - 145 mEq/L 142 139 140  Potassium 3.5 - 5.1 mEq/L 3.8 3.9 4.1  Chloride 96 - 112 mEq/L 106 104 104  CO2 19 - 32 mEq/L 27 26 26   Calcium 8.4 - 10.5 mg/dL 9.5 9.8 9.5  Total Protein 6.0 - 8.3 g/dL 6.6 7.0 6.5  Total Bilirubin  0.2 - 1.2 mg/dL 0.4 0.2 0.3  Alkaline Phos 39 - 117 U/L 73 71 76  AST 0 - 37 U/L 25 20 22   ALT 0 - 35 U/L 41(H) 38(H) 42(H)     Carmell Austria, MD 04/19/2020, 9:06 AM  Cc: Debbrah Alar, NP

## 2020-04-19 NOTE — Patient Instructions (Addendum)
If you are age 58 or older, your body mass index should be between 23-30. Your Body mass index is 37.26 kg/m. If this is out of the aforementioned range listed, please consider follow up with your Primary Care Provider.  If you are age 71 or younger, your body mass index should be between 19-25. Your Body mass index is 37.26 kg/m. If this is out of the aformentioned range listed, please consider follow up with your Primary Care Provider.   You have been scheduled for a colonoscopy. Please follow written instructions given to you at your visit today.  Please pick up your prep supplies at the pharmacy within the next 1-3 days. If you use inhalers (even only as needed), please bring them with you on the day of your procedure. Your physician has requested that you go to www.startemmi.com and enter the access code given to you at your visit today. This web site gives a general overview about your procedure. However, you should still follow specific instructions given to you by our office regarding your preparation for the procedure.  We have sent the following medications to your pharmacy for you to pick up at your convenience: Sutab (make sure pharmacy runs code)  Thank you for choosing me and [facility].   Jackquline Denmark, MD

## 2020-05-01 ENCOUNTER — Encounter: Payer: Self-pay | Admitting: Gastroenterology

## 2020-05-02 ENCOUNTER — Telehealth: Payer: Self-pay | Admitting: Gastroenterology

## 2020-05-02 NOTE — Telephone Encounter (Signed)
Prep had to be switched to miralax due to cost and coupon with subtab only took 35 dollars per her pharmacy costing her roughly 146 dollars. Patient is aware of new instructions for miralax and was told to check her mychart for the update and to call with questions regarding new instructions. And patient knows to bring her Covid-19 vaccine with her the day of procedure.

## 2020-05-02 NOTE — Telephone Encounter (Signed)
LVM for patient to call back. ?

## 2020-05-11 ENCOUNTER — Ambulatory Visit: Payer: 59 | Admitting: Internal Medicine

## 2020-05-14 ENCOUNTER — Encounter: Payer: 59 | Admitting: Gastroenterology

## 2020-05-15 ENCOUNTER — Encounter: Payer: Self-pay | Admitting: Internal Medicine

## 2020-05-15 ENCOUNTER — Other Ambulatory Visit: Payer: Self-pay

## 2020-05-15 ENCOUNTER — Ambulatory Visit: Payer: 59 | Admitting: Internal Medicine

## 2020-05-15 VITALS — BP 128/70 | HR 79 | Ht 64.5 in | Wt 213.0 lb

## 2020-05-15 DIAGNOSIS — E1165 Type 2 diabetes mellitus with hyperglycemia: Secondary | ICD-10-CM | POA: Diagnosis not present

## 2020-05-15 DIAGNOSIS — E669 Obesity, unspecified: Secondary | ICD-10-CM | POA: Diagnosis not present

## 2020-05-15 DIAGNOSIS — E785 Hyperlipidemia, unspecified: Secondary | ICD-10-CM

## 2020-05-15 LAB — POCT GLYCOSYLATED HEMOGLOBIN (HGB A1C): Hemoglobin A1C: 6.1 % — AB (ref 4.0–5.6)

## 2020-05-15 NOTE — Patient Instructions (Addendum)
Please continue: - Metformin XR 1500 mg with dinner  Please stop: - Glipizide   Let me know if sugars increase so we can add back Ozempic.  Please return in 4 months with your sugar log.

## 2020-05-15 NOTE — Addendum Note (Signed)
Addended by: Cardell Peach I on: 05/15/2020 04:34 PM   Modules accepted: Orders

## 2020-05-15 NOTE — Progress Notes (Signed)
Patient ID: Angela Barr, female   DOB: 07-22-1961, 59 y.o.   MRN: [id number]  This visit occurred during the SARS-CoV-2 public health emergency.  Safety protocols were in place, including screening questions prior to the visit, additional usage of staff PPE, and extensive cleaning of exam room while observing appropriate contact time as indicated for disinfecting solutions.   HPI: Angela Barr is a 59 y.o.-year-old female, returning for f/u for DM2, dx in 2007, non-insulin-dependent, uncontrolled, without long term complications, but with hyperglycemia. Last visit 4 months ago.  Before last visit, she relaxed her diet and her sugars increased. She tried to establish care with [facility] but she did not feel the wt loss was rapid enough for her. Now on Optivia  - for last 1.5 mo with very good results: She lost 15 pounds and the sugars improved significantly.  Reviewed HbA1c levels: Lab Results  Component Value Date   HGBA1C 7.1 (A) 01/10/2020   HGBA1C 6.4 (A) 09/12/2019   HGBA1C 6.7 (A) 05/12/2019   HGBA1C 7.2 (A) 01/19/2019   HGBA1C 6.6 (A) 09/17/2018   HGBA1C 6.4 (A) 05/14/2018   HGBA1C 7.3 02/08/2018   HGBA1C 6.6 11/11/2017   HGBA1C 6.6 08/11/2017   HGBA1C 6.6 04/01/2017   HGBA1C 6.3 11/14/2016   HGBA1C 6.6 08/15/2016   HGBA1C 6.4 05/15/2016   HGBA1C 7.5 (H) 02/05/2016   HGBA1C 7.1 09/25/2015   HGBA1C 7.7 06/25/2015   HGBA1C 7.6 (H) 03/23/2015   HGBA1C 11.1 (H) 12/25/2014   HGBA1C 8.2 (H) 06/13/2014   HGBA1C 7.6 (H) 06/29/2013  09/25/2015: HbA1c 7.1%  She is now on: - Metformin XR 500 mg with lunch and 1000 mg with dinner >> 1500 mg with dinner - Invokana 300 mg before breakfast - h/o yeast inf >> Jardiance 25 mg in a.m. >> off b/c expensive - Glimepiride 2 to 4 mg 2x a day before meals >> Glipizide 5 mg twice a day before breakfast and dinner  >> Trulicity 1.5 mg weekly  - nausea >> Ozempic 0.5 mg weekly >> off for 1.5 mo now b/c expensive Stopped  Trulicity 1.5 mg >> stopped b/c Nausea and HAs, and also expensive - now tolerating it well, with mild nausea  She was Bydureon >> lumps at inj. Site.  She was on Victoza, now too expensive.  She is checking sugars 0 to once a day: - am:  115, 131-148, 158, 170 >> 113-121 >> 125-156 >> n/c - 2h after b'fast:  n/c >> 140, 151, 185 >> 191 >> n/c - before lunch: 85-140 >> 141-173 >> 120s, 190 >> n/c - 2h after lunch: 105, 112 >> 144-220, 270 >> n/c >> 101-116 - before dinner:  101, 167 >> 89-110 >> 116 >> n/c >> 85, 98 - 2h after dinner: n/c >> 167-227 >> 178 >> n/c >> 89 - bedtime:  n/c >> 156 >> 102 >> 149 >> n/c - nighttime: n/c >> 70s occas. >> 129 >> n/c Lowest sugar was 89 >> 113 >> 125 >> ; she has hypoglycemia awareness in the 90s. Highest sugar was 185 >> 191.  Glucometer: [name]   Eats sunflower seeds and nuts. Stopped sodas, but at last visit she had more sweet tea -strongly advised her to stop.  No CKD; Last BUN/creatinine:  Lab Results  Component Value Date   BUN 11 11/15/2019   CREATININE 0.55 11/15/2019   Normal ACR levels: Lab Results  Component Value Date   MICRALBCREAT 0.6 05/14/2018  MICRALBCREAT 0.7 02/05/2016   MICRALBCREAT 0.7 12/25/2014   MICRALBCREAT 2.0 06/13/2014   MICRALBCREAT 0.7 04/04/2013   MICRALBCREAT 0.5 01/30/2012   MICRALBCREAT 0.3 06/17/2011  We stopped lisinopril as she had low blood pressure and dizziness in the past. She is on losartan now.  + HL; last set of lipids: Lab Results  Component Value Date   CHOL 114 11/15/2019   HDL 49.40 11/15/2019   LDLCALC 52 11/15/2019   TRIG 60.0 11/15/2019   CHOLHDL 2 11/15/2019  On Crestor 20. - last eye exam was in 2020: No DR - no numbness and tingling in her feet.   ROS: Constitutional: no weight gain/+ weight loss, no fatigue, no subjective hyperthermia, no subjective hypothermia Eyes: no blurry vision, no xerophthalmia ENT: no sore throat, no nodules palpated in neck, no  dysphagia, no odynophagia, no hoarseness Cardiovascular: no CP/no SOB/no palpitations/no leg swelling Respiratory: no cough/no SOB/no wheezing Gastrointestinal: no N/no V/no D/no C/no acid reflux Musculoskeletal: no muscle aches/no joint aches Skin: no rashes, no hair loss Neurological: no tremors/no numbness/no tingling/no dizziness  I reviewed pt's medications, allergies, PMH, social hx, family hx, and changes were documented in the history of present illness. Otherwise, unchanged from my initial visit note.  Past Medical History:  Diagnosis Date  . Arthritis    Hips   . Colon polyps 2008   [place]--Dr Jethro Poling  . Diabetes mellitus 2006  . Hyperlipemia 2012  . Hypertension 2012   off meds at 01-08-18 [facility]   . Obesity   . Palpitations    Past Surgical History:  Procedure Laterality Date  . BREAST BIOPSY  2003  . COLONOSCOPY  07/07/2011  . POLYPECTOMY    . TUBAL LIGATION  1990  . VAGINAL HYSTERECTOMY  2006  . WISDOM TOOTH EXTRACTION     History   Social History  . Marital Status: Married    Spouse Name: N/A  . Number of Children: 3   Occupational History  . RN    Social History Main Topics  . Smoking status: Never Smoker   . Smokeless tobacco: Never Used  . Alcohol Use: No  . Drug Use: No   Social History Narrative   3 caffeine drinks daily    Current Outpatient Medications on File Prior to Visit  Medication Sig Dispense Refill  . Cholecalciferol (VITAMIN D-3) 25 MCG (1000 UT) CAPS Take 1 capsule by mouth daily.    . cyclobenzaprine (FLEXERIL) 5 MG tablet Take 1 tablet (5 mg total) by mouth 3 (three) times daily as needed for muscle spasms. (Patient not taking: Reported on 04/19/2020) 30 tablet 1  . glipiZIDE (GLUCOTROL) 5 MG tablet Take 5 mg by mouth 2 (two) times daily before a meal.    . Glucos-Chond-Hyal Ac-Ca Fructo (MOVE FREE JOINT HEALTH ADVANCE) TABS Take 1 tablet by mouth daily. (Patient taking differently: Take 1 tablet by mouth daily as needed. )    .  losartan (COZAAR) 100 MG tablet Take 1 tablet (100 mg total) by mouth daily. 90 tablet 3  . metFORMIN (GLUCOPHAGE-XR) 500 MG 24 hr tablet TAKE 1 TABLET BY MOUTH EVERY MORNING AND 2 TABLETS WITH DINNER 270 tablet 1  . Multiple Vitamin (MULTIVITAMIN ADULT PO) Take by mouth.    Angela Barr test strip CHECK YOUR BLOOD SUGAR TWICE DAILY 100 strip 11  . OZEMPIC, 0.25 OR 0.5 MG/DOSE, 2 MG/1.5ML SOPN Inject 0.5 mg into the skin once a week. 3 pen 3  . rosuvastatin (CRESTOR) 20 MG tablet  Take 1 tablet (20 mg total) by mouth at bedtime. 90 tablet 1  . Sodium Sulfate-Mag Sulfate-KCl (SUTAB) [phone] MG TABS Take 1 kit by mouth as directed. BIN: [id number] PCN: CN GROUP: [id number] MEMBER ID: [id number];LOV AS CASH 24 tablet 0  . traMADol (ULTRAM) 50 MG tablet Take 1 tablet (50 mg total) by mouth every 6 (six) hours as needed. 30 tablet 0   No current facility-administered medications on file prior to visit.   Allergies  Allergen Reactions  . Bactrim Nausea Only  . Sulfa Antibiotics Nausea And Vomiting  . Sulfamethoxazole-Trimethoprim    Family History  Problem Relation Age of Onset  . Heart failure Father   . Hypertension Father   . Colon cancer Father 34  . Diabetes Father   . Heart disease Father   . Sudden death Father   . Obesity Father   . Hypertension Mother   . High Cholesterol Mother   . Stroke Mother   . Breast cancer Maternal Grandmother   . Diabetes Paternal Grandmother   . Colon polyps Neg Hx   . Esophageal cancer Neg Hx   . Rectal cancer Neg Hx   . Stomach cancer Neg Hx    PE: BP 128/70   Pulse 79   Ht 5' 4.5" (1.638 m)   Wt 213 lb (96.6 kg)   SpO2 99%   BMI 36.00 kg/m  Body mass index is 36 kg/m. Wt Readings from Last 3 Encounters:  05/15/20 213 lb (96.6 kg)  04/19/20 220 lb 8 oz (100 kg)  03/15/20 228 lb (103.4 kg)   Constitutional: overweight, in NAD Eyes: PERRLA, EOMI, no exophthalmos ENT: moist mucous membranes, no thyromegaly, no cervical  lymphadenopathy Cardiovascular: RRR, No MRG Respiratory: CTA B Gastrointestinal: abdomen soft, NT, ND, BS+ Musculoskeletal: no deformities, strength intact in all 4 Skin: moist, warm, no rashes Neurological: no tremor with outstretched hands, DTR normal in all 4  ASSESSMENT: 1. DM2, non-insulin-dependent, now better controlled, without long term complications, but with hyperglycemia  2. Obesity class 2 BMI Classification:  < 18.5 underweight   18.5-24.9 normal weight   25.0-29.9 overweight   30.0-34.9 class I obesity   35.0-39.9 class II obesity  ? 40.0 class III obesity   3. HL  PLAN:  1. Patient with longstanding, uncontrolled, type 2 diabetes, with improvement in blood sugars after addition of an SGLT2 inhibitor and GLP-1 receptor agonist.  Had problems with Ozempic being not covered by her insurance but finally a PA was approved at the end of 07/2019 and again recently.  She tolerates the Ozempic well.  At last visit, HbA1c was higher, at 7.1%, increased from 6.4%.  At that time, she relaxed her diet at night and ate more processed foods and drinks sweet tea.  I strongly advised her to stop the sweet tea and reduce processed foods.  She was only checking sugars in the morning and they were mostly above target.  I advised her to also check some sugars later in the day.  At this visit, she checks later in the day, but not in the morning and I advised her to check some sugars when she wakes up.  However, the sugars improved significantly after she started on the Optivia diet 1.5 months ago.  She actually came [place] and Ozempic due to price, but she did not need after she started the diet sugars are all at goal.  In fact, at today's visit, since she has sugars in the 80s later in the  day, will try to stop glipizide.  I did advise her to let me know if the sugars increase so we can add back Ozempic, which she is willing to restart if needed.  We did discuss about the benefits of GLP-1  receptor agonist not only on diabetes but also on weight, cardiovascular system, liver, kidneys. - I recommended:  Patient Instructions  Please continue: - Metformin XR 1500 mg with dinner  Please stop: - Glipizide   Let me know if sugars increase so we can add back Ozempic.  Please return in 4 months with your sugar log.   - we checked her HbA1c: 6.1% (much better) - advised to check sugars at different times of the day - 1x a day, rotating check times - advised for yearly eye exams >> she is UTD - return to clinic in 3-4 months   2. Obesity class 2 -Now off SGLT 2 inhibitor and GLP-1 receptor agonist due to price -At last visit we discussed about improving diet and cut out sweet tea and she was planning to reestablish care with [facility] but she did not feel this ws a good fit for her >> now doing [place] -She lost 15 lbs since last OV!   3. HL -Reviewed latest lipid panel from 10/2019: All fractions at goal: Lab Results  Component Value Date   CHOL 114 11/15/2019   HDL 49.40 11/15/2019   LDLCALC 52 11/15/2019   TRIG 60.0 11/15/2019   CHOLHDL 2 11/15/2019  -Continues Crestor without side effects  Philemon Kingdom, MD PhD [facility]

## 2020-05-24 ENCOUNTER — Telehealth: Payer: Self-pay | Admitting: Family Medicine

## 2020-05-24 DIAGNOSIS — I10 Essential (primary) hypertension: Secondary | ICD-10-CM

## 2020-05-24 MED ORDER — LOSARTAN POTASSIUM 100 MG PO TABS: 100.0000 mg | ORAL_TABLET | Freq: Every day | ORAL | 0 refills | Status: DC

## 2020-05-24 NOTE — Telephone Encounter (Signed)
New message:    1.Medication Requested: losartan (COZAAR) 100 MG tablet 2. Pharmacy (Name, Street, [place]): [place], [place] 3. On Med List: yes  4. Last Visit with PCP: 11/15/19  5. Next visit date with PCP: none   Agent: Please be advised that RX refills may take up to 3 business days. We ask that you follow-up with your pharmacy.

## 2020-05-24 NOTE — Telephone Encounter (Signed)
30 day supply sent to [facility] Pt is overdue for routine follow-up w/ PCP. Please schedule at her convenience.

## 2020-06-04 ENCOUNTER — Ambulatory Visit: Payer: 59 | Admitting: Family Medicine

## 2020-06-04 ENCOUNTER — Other Ambulatory Visit: Payer: Self-pay

## 2020-06-04 ENCOUNTER — Encounter: Payer: Self-pay | Admitting: Family Medicine

## 2020-06-04 VITALS — BP 142/75 | HR 74 | Temp 98.2°F | Resp 18 | Ht 64.5 in | Wt 214.6 lb

## 2020-06-04 DIAGNOSIS — E785 Hyperlipidemia, unspecified: Secondary | ICD-10-CM | POA: Diagnosis not present

## 2020-06-04 DIAGNOSIS — E1169 Type 2 diabetes mellitus with other specified complication: Secondary | ICD-10-CM | POA: Diagnosis not present

## 2020-06-04 DIAGNOSIS — I10 Essential (primary) hypertension: Secondary | ICD-10-CM

## 2020-06-04 MED ORDER — LOSARTAN POTASSIUM 100 MG PO TABS: 100.0000 mg | ORAL_TABLET | Freq: Every day | ORAL | 1 refills | Status: DC

## 2020-06-04 NOTE — Progress Notes (Signed)
Patient ID: Angela Barr, female    DOB: [birth year]  Age: 59 y.o. MRN: [id number]    Subjective:  Subjective  HPI Angela Barr presents for f/u bp and chol-- she has been doing optavia and has lost almost 20 lbs No compliants  Review of Systems  Constitutional: Negative for appetite change, diaphoresis, fatigue and unexpected weight change.  Eyes: Negative for pain, redness and visual disturbance.  Respiratory: Negative for cough, chest tightness, shortness of breath and wheezing.   Cardiovascular: Negative for chest pain, palpitations and leg swelling.  Endocrine: Negative for cold intolerance, heat intolerance, polydipsia, polyphagia and polyuria.  Genitourinary: Negative for difficulty urinating, dysuria and frequency.  Neurological: Negative for dizziness, light-headedness, numbness and headaches.    History Past Medical History:  Diagnosis Date  . Arthritis    Hips   . Colon polyps 2008   [name]  . Diabetes mellitus 2006  . Hyperlipemia 2012  . Hypertension 2012   off meds at 01-08-18 [facility]   . Obesity   . Palpitations     She has a past surgical history that includes Vaginal hysterectomy (2006); Breast biopsy (2003); Tubal ligation (1990); Colonoscopy (07/07/2011); Polypectomy; and Wisdom tooth extraction.   Her family history includes Breast cancer in her maternal grandmother; Colon cancer (age of onset: 52) in her father; Diabetes in her father and paternal grandmother; Heart disease in her father; Heart failure in her father; High Cholesterol in her mother; Hypertension in her father and mother; Obesity in her father; Stroke in her mother; Sudden death in her father.She reports that she has never smoked. She has never used smokeless tobacco. She reports that she does not drink alcohol and does not use drugs.  Current Outpatient Medications on File Prior to Visit  Medication Sig Dispense Refill  . glipiZIDE (GLUCOTROL) 5 MG tablet Take 5 mg by mouth 2  (two) times daily before a meal.    . metFORMIN (GLUCOPHAGE-XR) 500 MG 24 hr tablet TAKE 1 TABLET BY MOUTH EVERY MORNING AND 2 TABLETS WITH DINNER 270 tablet 1  . Multiple Vitamin (MULTIVITAMIN ADULT PO) Take by mouth.    Angela Barr VERIO test strip CHECK YOUR BLOOD SUGAR TWICE DAILY 100 strip 11  . rosuvastatin (CRESTOR) 20 MG tablet Take 1 tablet (20 mg total) by mouth at bedtime. 90 tablet 1  . Sodium Sulfate-Mag Sulfate-KCl (SUTAB) [id number] MG TABS Take 1 kit by mouth as directed. BIN: [id number] PCN: CN GROUP: [id number] MEMBER ID: [id number];VXY AS CASH (Patient not taking: Reported on 06/04/2020) 24 tablet 0   No current facility-administered medications on file prior to visit.     Objective:  Objective  Physical Exam Vitals and nursing note reviewed.  Constitutional:      Appearance: She is well-developed.  HENT:     Head: Normocephalic and atraumatic.  Eyes:     Conjunctiva/sclera: Conjunctivae normal.  Neck:     Thyroid: No thyromegaly.     Vascular: No carotid bruit or JVD.  Cardiovascular:     Rate and Rhythm: Normal rate and regular rhythm.     Heart sounds: Normal heart sounds. No murmur heard.   Pulmonary:     Effort: Pulmonary effort is normal. No respiratory distress.     Breath sounds: Normal breath sounds. No wheezing or rales.  Chest:     Chest wall: No tenderness.  Musculoskeletal:     Cervical back: Normal range of motion and neck supple.  Neurological:     Mental Status: She is  alert and oriented to person, place, and time.    BP (!) 142/75 (BP Location: Right Arm, Patient Position: Sitting, Cuff Size: Large)   Pulse 74   Temp 98.2 F (36.8 C) (Oral)   Resp 18   Ht 5' 4.5" (1.638 m)   Wt 214 lb 9.6 oz (97.3 kg)   SpO2 100%   BMI 36.27 kg/m  Wt Readings from Last 3 Encounters:  06/04/20 214 lb 9.6 oz (97.3 kg)  05/15/20 213 lb (96.6 kg)  04/19/20 220 lb 8 oz (100 kg)     Lab Results  Component Value Date   WBC 7.0 03/09/2020   HGB 13.1  03/09/2020   HCT 40.2 03/09/2020   PLT 218.0 03/09/2020   GLUCOSE 95 11/15/2019   CHOL 114 11/15/2019   TRIG 60.0 11/15/2019   HDL 49.40 11/15/2019   LDLCALC 52 11/15/2019   ALT 41 (H) 11/15/2019   AST 25 11/15/2019   NA 142 11/15/2019   K 3.8 11/15/2019   CL 106 11/15/2019   CREATININE 0.55 11/15/2019   BUN 11 11/15/2019   CO2 27 11/15/2019   TSH 0.43 11/15/2019   HGBA1C 6.1 (A) 05/15/2020   MICROALBUR 0.8 05/14/2018    DG Lumbar Spine Complete  Result Date: 03/16/2020 CLINICAL DATA:  Persistent low back pain. Additional provided: Lower back pain and right hip pain for 4 days. EXAM: LUMBAR SPINE - COMPLETE 4+ VIEW COMPARISON:  No pertinent prior studies available for comparison. FINDINGS: Five lumbar vertebrae. Trace L1-L2, L2-L3 and L3-L4 grade 1 retrolisthesis. No lumbar compression fracture is demonstrated. No more than mild disc space narrowing at any level. Multilevel facet arthrosis, greatest at L3-L4 through L5-S1 levels. No convincing pars interarticularis defect. IMPRESSION: No lumbar compression fracture. Lumbar spondylosis as described. Most notably, there is multilevel facet arthrosis, greatest at the L3-L4 through L5-S1 levels. Mild multilevel grade 1 retrolisthesis. Electronically Signed   By: Angela Simmering DO   On: 03/16/2020 09:36   DG HIP UNILAT WITH PELVIS 2-3 VIEWS RIGHT  Result Date: 03/16/2020 CLINICAL DATA:  RIGHT groin pain. LOWER back pain and RIGHT hip pain for 4 days. No known injury. EXAM: DG HIP (WITH OR WITHOUT PELVIS) 2-3V RIGHT COMPARISON:  None. FINDINGS: There are mild degenerative changes in the hips bilaterally. No acute fracture or subluxation. IMPRESSION: Mild degenerative changes. Electronically Signed   By: Angela Barr M.D.   On: 03/16/2020 09:36     Assessment & Plan:  Plan  I have discontinued Angela Barr, Angela Barr (0.25 or 0.5 MG/DOSE), traMADol, cyclobenzaprine, and Vitamin D-3. I am also having her  maintain her OneTouch Verio, rosuvastatin, Multiple Vitamin (MULTIVITAMIN ADULT PO), glipiZIDE, metFORMIN, Sutab, and losartan.  Meds ordered this encounter  Medications  . losartan (COZAAR) 100 MG tablet    Sig: Take 1 tablet (100 mg total) by mouth daily.    Dispense:  90 tablet    Refill:  1    Problem List Items Addressed This Visit      Unprioritized   Essential hypertension   Relevant Medications   losartan (COZAAR) 100 MG tablet   Other Relevant Orders   Lipid panel   Comprehensive metabolic panel    Other Visit Diagnoses    Hyperlipidemia associated with type 2 diabetes mellitus ([place])    -  Primary   Relevant Medications   losartan (COZAAR) 100 MG tablet   Other Relevant Orders   Lipid panel   Comprehensive metabolic panel  Follow-up: Return in about 6 months (around 12/05/2020) for annual exam, fasting.  [name], DO

## 2020-06-04 NOTE — Patient Instructions (Signed)
DASH Eating Plan DASH stands for "Dietary Approaches to Stop Hypertension." The DASH eating plan is a healthy eating plan that has been shown to reduce high blood pressure (hypertension). It may also reduce your risk for type 2 diabetes, heart disease, and stroke. The DASH eating plan may also help with weight loss. What are tips for following this plan?  General guidelines  Avoid eating more than 2,300 mg (milligrams) of salt (sodium) a day. If you have hypertension, you may need to reduce your sodium intake to 1,500 mg a day.  Limit alcohol intake to no more than 1 drink a day for nonpregnant women and 2 drinks a day for men. One drink equals 12 oz of beer, 5 oz of wine, or 1 oz of hard liquor.  Work with your health care provider to maintain a healthy body weight or to lose weight. Ask what an ideal weight is for you.  Get at least 30 minutes of exercise that causes your heart to beat faster (aerobic exercise) most days of the week. Activities may include walking, swimming, or biking.  Work with your health care provider or diet and nutrition specialist (dietitian) to adjust your eating plan to your individual calorie needs. Reading food labels   Check food labels for the amount of sodium per serving. Choose foods with less than 5 percent of the Daily Value of sodium. Generally, foods with less than 300 mg of sodium per serving fit into this eating plan.  To find whole grains, look for the word "whole" as the first word in the ingredient list. Shopping  Buy products labeled as "low-sodium" or "no salt added."  Buy fresh foods. Avoid canned foods and premade or frozen meals. Cooking  Avoid adding salt when cooking. Use salt-free seasonings or herbs instead of table salt or sea salt. Check with your health care provider or pharmacist before using salt substitutes.  Do not fry foods. Cook foods using healthy methods such as baking, boiling, grilling, and broiling instead.  Cook with  heart-healthy oils, such as olive, canola, soybean, or sunflower oil. Meal planning  Eat a balanced diet that includes: ? 5 or more servings of fruits and vegetables each day. At each meal, try to fill half of your plate with fruits and vegetables. ? Up to 6-8 servings of whole grains each day. ? Less than 6 oz of lean meat, poultry, or fish each day. A 3-oz serving of meat is about the same size as a deck of cards. One egg equals 1 oz. ? 2 servings of low-fat dairy each day. ? A serving of nuts, seeds, or beans 5 times each week. ? Heart-healthy fats. Healthy fats called Omega-3 fatty acids are found in foods such as flaxseeds and coldwater fish, like sardines, salmon, and mackerel.  Limit how much you eat of the following: ? Canned or prepackaged foods. ? Food that is high in trans fat, such as fried foods. ? Food that is high in saturated fat, such as fatty meat. ? Sweets, desserts, sugary drinks, and other foods with added sugar. ? Full-fat dairy products.  Do not salt foods before eating.  Try to eat at least 2 vegetarian meals each week.  Eat more home-cooked food and less restaurant, buffet, and fast food.  When eating at a restaurant, ask that your food be prepared with less salt or no salt, if possible. What foods are recommended? The items listed may not be a complete list. Talk with your dietitian about   what dietary choices are best for you. Grains Whole-grain or whole-wheat bread. Whole-grain or whole-wheat pasta. Brown rice. Oatmeal. Quinoa. Bulgur. Whole-grain and low-sodium cereals. Pita bread. Low-fat, low-sodium crackers. Whole-wheat flour tortillas. Vegetables Fresh or frozen vegetables (raw, steamed, roasted, or grilled). Low-sodium or reduced-sodium tomato and vegetable juice. Low-sodium or reduced-sodium tomato sauce and tomato paste. Low-sodium or reduced-sodium canned vegetables. Fruits All fresh, dried, or frozen fruit. Canned fruit in natural juice (without  added sugar). Meat and other protein foods Skinless chicken or turkey. Ground chicken or turkey. Pork with fat trimmed off. Fish and seafood. Egg whites. Dried beans, peas, or lentils. Unsalted nuts, nut butters, and seeds. Unsalted canned beans. Lean cuts of beef with fat trimmed off. Low-sodium, lean deli meat. Dairy Low-fat (1%) or fat-free (skim) milk. Fat-free, low-fat, or reduced-fat cheeses. Nonfat, low-sodium ricotta or cottage cheese. Low-fat or nonfat yogurt. Low-fat, low-sodium cheese. Fats and oils Soft margarine without trans fats. Vegetable oil. Low-fat, reduced-fat, or light mayonnaise and salad dressings (reduced-sodium). Canola, safflower, olive, soybean, and sunflower oils. Avocado. Seasoning and other foods Herbs. Spices. Seasoning mixes without salt. Unsalted popcorn and pretzels. Fat-free sweets. What foods are not recommended? The items listed may not be a complete list. Talk with your dietitian about what dietary choices are best for you. Grains Baked goods made with fat, such as croissants, muffins, or some breads. Dry pasta or rice meal packs. Vegetables Creamed or fried vegetables. Vegetables in a cheese sauce. Regular canned vegetables (not low-sodium or reduced-sodium). Regular canned tomato sauce and paste (not low-sodium or reduced-sodium). Regular tomato and vegetable juice (not low-sodium or reduced-sodium). Pickles. Olives. Fruits Canned fruit in a light or heavy syrup. Fried fruit. Fruit in cream or butter sauce. Meat and other protein foods Fatty cuts of meat. Ribs. Fried meat. Bacon. Sausage. Bologna and other processed lunch meats. Salami. Fatback. Hotdogs. Bratwurst. Salted nuts and seeds. Canned beans with added salt. Canned or smoked fish. Whole eggs or egg yolks. Chicken or turkey with skin. Dairy Whole or 2% milk, cream, and half-and-half. Whole or full-fat cream cheese. Whole-fat or sweetened yogurt. Full-fat cheese. Nondairy creamers. Whipped toppings.  Processed cheese and cheese spreads. Fats and oils Butter. Stick margarine. Lard. Shortening. Ghee. Bacon fat. Tropical oils, such as coconut, palm kernel, or palm oil. Seasoning and other foods Salted popcorn and pretzels. Onion salt, garlic salt, seasoned salt, table salt, and sea salt. Worcestershire sauce. Tartar sauce. Barbecue sauce. Teriyaki sauce. Soy sauce, including reduced-sodium. Steak sauce. Canned and packaged gravies. Fish sauce. Oyster sauce. Cocktail sauce. Horseradish that you find on the shelf. Ketchup. Mustard. Meat flavorings and tenderizers. Bouillon cubes. Hot sauce and Tabasco sauce. Premade or packaged marinades. Premade or packaged taco seasonings. Relishes. Regular salad dressings. Where to find more information:  National Heart, Lung, and Blood Institute: www.nhlbi.nih.gov  American Heart Association: www.heart.org Summary  The DASH eating plan is a healthy eating plan that has been shown to reduce high blood pressure (hypertension). It may also reduce your risk for type 2 diabetes, heart disease, and stroke.  With the DASH eating plan, you should limit salt (sodium) intake to 2,300 mg a day. If you have hypertension, you may need to reduce your sodium intake to 1,500 mg a day.  When on the DASH eating plan, aim to eat more fresh fruits and vegetables, whole grains, lean proteins, low-fat dairy, and heart-healthy fats.  Work with your health care provider or diet and nutrition specialist (dietitian) to adjust your eating plan to your   individual calorie needs. This information is not intended to replace advice given to you by your health care provider. Make sure you discuss any questions you have with your health care provider. Document Revised: 09/25/2017 Document Reviewed: 10/06/2016 Elsevier Patient Education  2020 Elsevier Inc.  

## 2020-06-04 NOTE — Assessment & Plan Note (Signed)
Well controlled, no changes to meds. Encouraged heart healthy diet such as the DASH diet and exercise as tolerated.  °

## 2020-06-04 NOTE — Assessment & Plan Note (Signed)
Encouraged heart healthy diet, increase exercise, avoid trans fats, consider a krill oil cap daily 

## 2020-06-05 LAB — COMPREHENSIVE METABOLIC PANEL
ALT: 26 U/L (ref 0–35)
AST: 16 U/L (ref 0–37)
Albumin: 4.1 g/dL (ref 3.5–5.2)
Alkaline Phosphatase: 54 U/L (ref 39–117)
BUN: 13 mg/dL (ref 6–23)
CO2: 28 mEq/L (ref 19–32)
Calcium: 9.9 mg/dL (ref 8.4–10.5)
Chloride: 104 mEq/L (ref 96–112)
Creatinine, Ser: 0.49 mg/dL (ref 0.40–1.20)
GFR: 157.12 mL/min (ref >60.00)
Glucose, Bld: 107 mg/dL — ABNORMAL HIGH (ref 70–99)
Potassium: 3.8 mEq/L (ref 3.5–5.1)
Sodium: 140 mEq/L (ref 135–145)
Total Bilirubin: 0.3 mg/dL (ref 0.2–1.2)
Total Protein: 6.8 g/dL (ref 6.0–8.3)

## 2020-06-05 LAB — LIPID PANEL
Cholesterol: 126 mg/dL (ref 0–200)
HDL: 55.8 mg/dL (ref >39.00)
LDL Cholesterol: 58 mg/dL (ref 0–99)
NonHDL: 69.86
Total CHOL/HDL Ratio: 2
Triglycerides: 61 mg/dL (ref 0.0–149.0)
VLDL: 12.2 mg/dL (ref 0.0–40.0)

## 2020-06-19 ENCOUNTER — Telehealth: Payer: Self-pay | Admitting: Gastroenterology

## 2020-06-19 ENCOUNTER — Encounter: Payer: 59 | Admitting: Gastroenterology

## 2020-06-19 NOTE — Telephone Encounter (Signed)
Hey Dr Lyndel Safe, this pt cancelled her procedure with you today @11am  due to her forgetting procedure, she rescheduled for Oct\

## 2020-06-19 NOTE — Telephone Encounter (Signed)
Thanks for letting me know RG 

## 2020-07-31 ENCOUNTER — Telehealth: Payer: Self-pay

## 2020-07-31 NOTE — Telephone Encounter (Signed)
PA has been submitted on cover my meds for Ozempic on 07/31/2020 at 2:52pm. Will wait for a response.

## 2020-08-23 ENCOUNTER — Encounter: Payer: 59 | Admitting: Gastroenterology

## 2020-09-05 ENCOUNTER — Telehealth: Payer: Self-pay | Admitting: Internal Medicine

## 2020-09-05 ENCOUNTER — Other Ambulatory Visit: Payer: Self-pay

## 2020-09-05 DIAGNOSIS — E0822 Diabetes mellitus due to underlying condition with diabetic chronic kidney disease: Secondary | ICD-10-CM

## 2020-09-05 DIAGNOSIS — N182 Chronic kidney disease, stage 2 (mild): Secondary | ICD-10-CM

## 2020-09-05 MED ORDER — METFORMIN HCL ER 500 MG PO TB24: ORAL_TABLET | ORAL | 1 refills | Status: DC

## 2020-09-05 NOTE — Telephone Encounter (Signed)
RX sent to pharmacy  

## 2020-09-05 NOTE — Telephone Encounter (Signed)
Patient called to request the following new RX's: metFORMIN (GLUCOPHAGE-XR) 500 MG 24 hr tablet AND GLIMEPIRIDE be sent:   [facility] [place], [place] Phone:  [phone]  Fax:  [phone]

## 2020-09-07 ENCOUNTER — Other Ambulatory Visit: Payer: Self-pay | Admitting: Family Medicine

## 2020-09-07 DIAGNOSIS — I10 Essential (primary) hypertension: Secondary | ICD-10-CM

## 2020-09-10 ENCOUNTER — Ambulatory Visit: Payer: 59 | Admitting: Internal Medicine

## 2020-09-11 ENCOUNTER — Other Ambulatory Visit: Payer: Self-pay | Admitting: Family Medicine

## 2020-09-12 ENCOUNTER — Other Ambulatory Visit: Payer: Self-pay | Admitting: Family Medicine

## 2020-09-12 DIAGNOSIS — E1169 Type 2 diabetes mellitus with other specified complication: Secondary | ICD-10-CM

## 2020-09-13 ENCOUNTER — Telehealth: Payer: Self-pay | Admitting: Family Medicine

## 2020-09-13 DIAGNOSIS — E785 Hyperlipidemia, unspecified: Secondary | ICD-10-CM

## 2020-09-13 DIAGNOSIS — E1169 Type 2 diabetes mellitus with other specified complication: Secondary | ICD-10-CM

## 2020-09-13 MED ORDER — ROSUVASTATIN CALCIUM 20 MG PO TABS: 20.0000 mg | ORAL_TABLET | Freq: Every day | ORAL | 1 refills | Status: DC

## 2020-09-13 NOTE — Telephone Encounter (Signed)
Medication: rosuvastatin (CRESTOR) 20 MG tablet    Has the patient contacted their pharmacy? No. (If no, request that the patient contact the pharmacy for the refill.) (If yes, when and what did the pharmacy advise?)  Preferred Pharmacy (with phone number or street name):  [facility] [place], [address]  [place], [place] [address]  Phone:  [phone] Fax:  [phone]  DEA #:  [id number] Agent: Please be advised that RX refills may take up to 3 business days. We ask that you follow-up with your pharmacy.

## 2020-09-13 NOTE — Telephone Encounter (Signed)
Refill sent.

## 2020-09-25 ENCOUNTER — Ambulatory Visit: Payer: 59 | Admitting: Internal Medicine

## 2020-09-25 ENCOUNTER — Encounter: Payer: Self-pay | Admitting: Internal Medicine

## 2020-09-25 ENCOUNTER — Other Ambulatory Visit: Payer: Self-pay

## 2020-09-25 VITALS — BP 128/88 | HR 81 | Ht 64.0 in | Wt 218.8 lb

## 2020-09-25 DIAGNOSIS — E1165 Type 2 diabetes mellitus with hyperglycemia: Secondary | ICD-10-CM | POA: Diagnosis not present

## 2020-09-25 DIAGNOSIS — N182 Chronic kidney disease, stage 2 (mild): Secondary | ICD-10-CM

## 2020-09-25 DIAGNOSIS — E669 Obesity, unspecified: Secondary | ICD-10-CM | POA: Diagnosis not present

## 2020-09-25 DIAGNOSIS — E785 Hyperlipidemia, unspecified: Secondary | ICD-10-CM | POA: Diagnosis not present

## 2020-09-25 DIAGNOSIS — E0822 Diabetes mellitus due to underlying condition with diabetic chronic kidney disease: Secondary | ICD-10-CM | POA: Diagnosis not present

## 2020-09-25 LAB — POCT GLYCOSYLATED HEMOGLOBIN (HGB A1C): Hemoglobin A1C: 7 % — AB (ref 4.0–5.6)

## 2020-09-25 MED ORDER — METFORMIN HCL ER 500 MG PO TB24: ORAL_TABLET | ORAL | 1 refills | Status: DC

## 2020-09-25 MED ORDER — OZEMPIC (0.25 OR 0.5 MG/DOSE) 2 MG/1.5ML ~~LOC~~ SOPN: 0.5000 mg | PEN_INJECTOR | SUBCUTANEOUS | 3 refills | Status: DC

## 2020-09-25 NOTE — Patient Instructions (Addendum)
Please continue: - Metformin XR 500 mg in am and 1000 mg with dinner  Restart: - Ozempic 0.25 mg weekly x 1-2 doses, then increase to 0.5 mg weeks  Stop: - Glimepiride for now  Please return in 4 months with your sugar log.

## 2020-09-25 NOTE — Progress Notes (Signed)
Patient ID: Angela Barr, female   DOB: [birth year], 59 y.o.   MRN: [id number]  This visit occurred during the SARS-CoV-2 public health emergency.  Safety protocols were in place, including screening questions prior to the visit, additional usage of staff PPE, and extensive cleaning of exam room while observing appropriate contact time as indicated for disinfecting solutions.   HPI: Angela Barr is a 59 y.o.-year-old female, returning for f/u for DM2, dx in 2007, non-insulin-dependent, uncontrolled, without long term complications, but with hyperglycemia. Last visit 4 months ago.  Before last visit, she started [place] diet >> lost 15 pounds and her sugars improved. She stopped Optivia 1 mo ago and stopped exercise >> sugars higher >> will restart [place] tomorrow.  Reviewed HbA1c levels: Lab Results  Component Value Date   HGBA1C 6.1 (A) 05/15/2020   HGBA1C 7.1 (A) 01/10/2020   HGBA1C 6.4 (A) 09/12/2019   HGBA1C 6.7 (A) 05/12/2019   HGBA1C 7.2 (A) 01/19/2019   HGBA1C 6.6 (A) 09/17/2018   HGBA1C 6.4 (A) 05/14/2018   HGBA1C 7.3 02/08/2018   HGBA1C 6.6 11/11/2017   HGBA1C 6.6 08/11/2017   HGBA1C 6.6 04/01/2017   HGBA1C 6.3 11/14/2016   HGBA1C 6.6 08/15/2016   HGBA1C 6.4 05/15/2016   HGBA1C 7.5 (H) 02/05/2016   HGBA1C 7.1 09/25/2015   HGBA1C 7.7 06/25/2015   HGBA1C 7.6 (H) 03/23/2015   HGBA1C 11.1 (H) 12/25/2014   HGBA1C 8.2 (H) 06/13/2014  09/25/2015: HbA1c 7.1%  She is now on: - Metformin XR 500 mg with lunch and 1000 mg with dinner >> 1500 mg with dinner >> 500 mg in am and 1000 mg with dinner - Invokana 300 mg before breakfast - h/o yeast inf >> Jardiance 25 mg in a.m. >> stopped due to price - Glimepiride 2 to 4 mg 2x a day before meals >> Glipizide 5 mg twice a day before breakfast and dinner >> stopped due to good control >> restarted Glimepiride 2 mg before dinner 2 week ago  >> Trulicity 1.5 mg weekly  - nausea >> Ozempic 0.5 mg weekly >> stopped due to price Stopped  Trulicity 1.5 mg >> stopped b/c Nausea and HAs, and also expensive - now tolerating it well, with mild nausea  She was Bydureon >> lumps at inj. Site.  She was on Victoza, now too expensive.  She is checking sugars 0 to once a day: - am: 113-121 >> 125-156 >> n/c >> 139-166, 183 - 2h after b'fast:  n/c >> 140, 151, 185 >> 191 >> n/c - before lunch:141-173 >> 120s, 190 >> n/c >> 127-148 - 2h after lunch: 144-220, 270 >> n/c >> 101-116 >> n/c - before dinner:  116 >> n/c >> 85, 98 >> n/c - 2h after dinner:  178 >> n/c >> 89 >> n/c - bedtime:  n/c >> 156 >> 102 >> 149 >> n/c - nighttime: n/c >> 70s occas. >> 129 >> n/c Lowest sugar was 89 >> 113 >> 125 >> 127; she has hypoglycemia awareness in the 90s. Highest sugar was 185 >> 191 >> 183.  Glucometer: One Touch Verio Flex   Eats sunflower seeds and nuts.  She stopped sodas but changed to sweet tea in the past.  I advised her to also stop this.  No CKD; Last BUN/creatinine:  Lab Results  Component Value Date   BUN 13 06/04/2020   CREATININE 0.49 06/04/2020   Normal ACR levels: Lab Results  Component Value Date   MICRALBCREAT 0.6 05/14/2018   MICRALBCREAT 0.7  02/05/2016   MICRALBCREAT 0.7 12/25/2014   MICRALBCREAT 2.0 06/13/2014   MICRALBCREAT 0.7 04/04/2013   MICRALBCREAT 0.5 01/30/2012   MICRALBCREAT 0.3 06/17/2011  We stopped lisinopril as she had low blood pressure and dizziness in the past. On losartan.  + HL; last set of lipids: Lab Results  Component Value Date   CHOL 126 06/04/2020   HDL 55.80 06/04/2020   LDLCALC 58 06/04/2020   TRIG 61.0 06/04/2020   CHOLHDL 2 06/04/2020  On Crestor 20. - last eye exam was in Summer 2021: No DR reportedly -She denies numbness and tingling in her feet.   ROS: Constitutional: no weight gain/no weight loss, no fatigue, no subjective hyperthermia, no subjective hypothermia Eyes: no blurry vision, no xerophthalmia ENT: no sore throat, no nodules palpated in neck, no dysphagia, no  odynophagia, no hoarseness Cardiovascular: no CP/no SOB/no palpitations/no leg swelling Respiratory: no cough/no SOB/no wheezing Gastrointestinal: no N/no V/no D/no C/no acid reflux Musculoskeletal: no muscle aches/no joint aches Skin: no rashes, no hair loss Neurological: no tremors/no numbness/no tingling/no dizziness  I reviewed pt's medications, allergies, PMH, social hx, family hx, and changes were documented in the history of present illness. Otherwise, unchanged from my initial visit note.  Past Medical History:  Diagnosis Date  . Arthritis    Hips   . Colon polyps 2008   [name]  . Diabetes mellitus 2006  . Hyperlipemia 2012  . Hypertension 2012   off meds at 01-08-18 [facility]   . Obesity   . Palpitations    Past Surgical History:  Procedure Laterality Date  . BREAST BIOPSY  2003  . COLONOSCOPY  07/07/2011  . POLYPECTOMY    . TUBAL LIGATION  1990  . VAGINAL HYSTERECTOMY  2006  . WISDOM TOOTH EXTRACTION     History   Social History  . Marital Status: Married    Spouse Name: N/A  . Number of Children: 3   Occupational History  . RN    Social History Main Topics  . Smoking status: Never Smoker   . Smokeless tobacco: Never Used  . Alcohol Use: No  . Drug Use: No   Social History Narrative   3 caffeine drinks daily    Current Outpatient Medications on File Prior to Visit  Medication Sig Dispense Refill  . glipiZIDE (GLUCOTROL) 5 MG tablet Take 5 mg by mouth 2 (two) times daily before a meal.    . losartan (COZAAR) 100 MG tablet TAKE 1 TABLET(100 MG) BY MOUTH DAILY 90 tablet 0  . metFORMIN (GLUCOPHAGE-XR) 500 MG 24 hr tablet Take 3 tablets (1500 mg) by mouth at dinner 270 tablet 1  . Multiple Vitamin (MULTIVITAMIN ADULT PO) Take by mouth.    [name] VERIO test strip CHECK YOUR BLOOD SUGAR TWICE DAILY 100 strip 11  . rosuvastatin (CRESTOR) 20 MG tablet Take 1 tablet (20 mg total) by mouth at bedtime. 90 tablet 1  . Sodium Sulfate-Mag Sulfate-KCl  (SUTAB) 225 [id number] MG TABS Take 1 kit by mouth as directed. BIN: [id number] PCN: CN GROUP: [id number] MEMBER ID: [id number];BBC AS CASH (Patient not taking: Reported on 06/04/2020) 24 tablet 0   No current facility-administered medications on file prior to visit.   Allergies  Allergen Reactions  . Bactrim Nausea Only  . Sulfa Antibiotics Nausea And Vomiting  . Sulfamethoxazole-Trimethoprim    Family History  Problem Relation Age of Onset  . Heart failure Father   . Hypertension Father   . Colon cancer Father 36  . Diabetes  Father   . Heart disease Father   . Sudden death Father   . Obesity Father   . Hypertension Mother   . High Cholesterol Mother   . Stroke Mother   . Breast cancer Maternal Grandmother   . Diabetes Paternal Grandmother   . Colon polyps Neg Hx   . Esophageal cancer Neg Hx   . Rectal cancer Neg Hx   . Stomach cancer Neg Hx    PE: BP 128/88   Pulse 81   Ht 5' 4" (1.626 m)   Wt 218 lb 12.8 oz (99.2 kg)   SpO2 98%   BMI 37.56 kg/m  Body mass index is 37.56 kg/m. Wt Readings from Last 3 Encounters:  09/25/20 218 lb 12.8 oz (99.2 kg)  06/04/20 214 lb 9.6 oz (97.3 kg)  05/15/20 213 lb (96.6 kg)   Constitutional: overweight, in NAD Eyes: PERRLA, EOMI, no exophthalmos ENT: moist mucous membranes, no thyromegaly, no cervical lymphadenopathy Cardiovascular: RRR, No MRG Respiratory: CTA B Gastrointestinal: abdomen soft, NT, ND, BS+ Musculoskeletal: no deformities, strength intact in all 4 Skin: moist, warm, no rashes Neurological: no tremor with outstretched hands, DTR normal in all 4  ASSESSMENT: 1. DM2, non-insulin-dependent, now better controlled, without long term complications, but with hyperglycemia  2. Obesity class 2 BMI Classification:  < 18.5 underweight   18.5-24.9 normal weight   25.0-29.9 overweight   30.0-34.9 class I obesity   35.0-39.9 class II obesity  ? 40.0 class III obesity   3. HL  PLAN:  1. Patient with longstanding,  uncontrolled diabetes, with improvement in her blood sugars after addition of SGLT2 inhibitor and GLP-1 receptor agonist, however, she elected to come off of these due to price before last visit.  Of note, a PA for Ozempic was finally approved in 07/2019 and again before last visit.  At last visit, since sugars were excellent and HbA1c was 6.1%.  She was having some mild lower blood sugars (the 80s) later in the day, so we stopped glipizide.  At that time, she was on [place], started 1.5 months prior to the visit.  - she is now off Optivia x 1 mo and she stopped exercising >> sugars are  higher, almost all above goal -she only checks in the first half of the day.  She restarted sulfonylurea a week ago.  She was given glimepiride instead of glipizide from the pharmacy.  She uses 2 mg before dinner. -However, she plans to restart Optivia tomorrow -At this visit, I suggested to switch from glimepiride to Ozempic, initially at low dose but may need to increase if needed.  She has been on this in the past and she agrees to restart it.   - I recommended:  Patient Instructions  Please continue: - Metformin XR 500 mg in am and 1000 mg with dinner  Restart: - Ozempic 0.25 mg weekly x 1-2 doses, then increase to 0.5 mg weeks  Stop: - Glimepiride for now  Please return in 4 months with your sugar log.   - we checked her HbA1c: 7.0% (higher) - advised to check sugars at different times of the day - 1x a day, rotating check times - advised for yearly eye exams >> she is UTD - return to clinic in 4 months   2. Obesity class 2 -At last visit, she was SGLT2 inhibitor and GLP-1 receptor agonist due to price, however, she did very well and did not gain weight after stopping these, on [place] -At  last visit we discussed about improving diet and cut out sweet tea and she was planning to reestablish care with the [facility] but she did not feel this was a good fit for her. -She lost  15 pounds before last visit, and gained 5 pounds since last visit after coming off [place] -However, she plans to restart the diet and will also restart Ozempic which should help with weight loss.  3. HL -Reviewed latest lipid panel from 05/2020: All fractions at goal: Lab Results  Component Value Date   CHOL 126 06/04/2020   HDL 55.80 06/04/2020   LDLCALC 58 06/04/2020   TRIG 61.0 06/04/2020   CHOLHDL 2 06/04/2020  -Continues Crestor 20 without side effects  Philemon Kingdom, MD PhD [facility] Endocrinology

## 2020-11-01 ENCOUNTER — Other Ambulatory Visit: Payer: Self-pay | Admitting: Internal Medicine

## 2020-12-17 DIAGNOSIS — Z1231 Encounter for screening mammogram for malignant neoplasm of breast: Secondary | ICD-10-CM

## 2020-12-18 ENCOUNTER — Ambulatory Visit (INDEPENDENT_AMBULATORY_CARE_PROVIDER_SITE_OTHER): Payer: 59 | Admitting: Obstetrics and Gynecology

## 2020-12-18 ENCOUNTER — Other Ambulatory Visit: Payer: Self-pay

## 2020-12-18 ENCOUNTER — Encounter: Payer: Self-pay | Admitting: Obstetrics and Gynecology

## 2020-12-18 VITALS — BP 142/92 | HR 89 | Ht 64.0 in | Wt 212.0 lb

## 2020-12-18 DIAGNOSIS — Z01419 Encounter for gynecological examination (general) (routine) without abnormal findings: Secondary | ICD-10-CM

## 2020-12-18 NOTE — Progress Notes (Signed)
NGYN patient presents for Annual Exam today.  Last pap:> 3 Pt has had HYST  Mammogram: In chart 12/2018 per pt had last yr.  Family Hx of Breast Cancer: YES MGM  STD Screening: None    CC: None

## 2020-12-18 NOTE — Progress Notes (Signed)
Subjective:     Angela Barr is a 60 y.o. female P3 s/p hysterectomy in 2006 for adenomyosis who is here for a comprehensive physical exam. The patient reports no problems. She is sexually active without complaints. She denies pelvic pain or abnormal discharge. She denies urinary incontinence. She is due for a colonoscopy and mammogram. Patient denies a history of abnormal pap smear.  Past Medical History:  Diagnosis Date  . Arthritis    Hips   . Colon polyps 2008   [name]  . Diabetes mellitus 2006  . Hyperlipemia 2012  . Hypertension 2012   off meds at 01-08-18 [facility]   . Obesity   . Palpitations    Past Surgical History:  Procedure Laterality Date  . BREAST BIOPSY  2003  . COLONOSCOPY  07/07/2011  . POLYPECTOMY    . TUBAL LIGATION  1990  . VAGINAL HYSTERECTOMY  2006  . WISDOM TOOTH EXTRACTION     Family History  Problem Relation Age of Onset  . Heart failure Father   . Hypertension Father   . Colon cancer Father 29  . Diabetes Father   . Heart disease Father   . Sudden death Father   . Obesity Father   . Hypertension Mother   . High Cholesterol Mother   . Stroke Mother   . Breast cancer Maternal Grandmother   . Diabetes Paternal Grandmother   . Colon polyps Neg Hx   . Esophageal cancer Neg Hx   . Rectal cancer Neg Hx   . Stomach cancer Neg Hx      Social History   Socioeconomic History  . Marital status: Married    Spouse name: Jenny Reichmann  . Number of children: 3  . Years of education: Not on file  . Highest education level: Not on file  Occupational History  . Occupation: Therapist, sports  Tobacco Use  . Smoking status: Never Smoker  . Smokeless tobacco: Never Used  Vaping Use  . Vaping Use: Never used  Substance and Sexual Activity  . Alcohol use: No  . Drug use: No  . Sexual activity: Yes    Partners: Male    Birth control/protection: Surgical  Other Topics Concern  . Not on file  Social History Narrative   3 caffeine drinks daily.   Exercise-no    Lives at home with husband    Social Determinants of Radio broadcast assistant Strain: Not on file  Food Insecurity: Not on file  Transportation Needs: Not on file  Physical Activity: Not on file  Stress: Not on file  Social Connections: Not on file  Intimate Partner Violence: Not on file   Health Maintenance  Topic Date Due  . COLONOSCOPY (Pts 45-11yrs Insurance coverage will need to be confirmed)  09/22/1980  . OPHTHALMOLOGY EXAM  01/22/2017  . MAMMOGRAM  12/28/2019  . INFLUENZA VACCINE  05/27/2020  . COVID-19 Vaccine (3 - Booster for Moderna series) 11/01/2020  . FOOT EXAM  11/14/2020  . HEMOGLOBIN A1C  03/25/2021  . TETANUS/TDAP  11/14/2029  . PNEUMOCOCCAL POLYSACCHARIDE VACCINE AGE 57-64 HIGH RISK  Completed  . Hepatitis C Screening  Completed  . HIV Screening  Completed       Review of Systems Pertinent items noted in HPI and remainder of comprehensive ROS otherwise negative.   Objective:  Blood pressure (!) 142/92, pulse 89, height 5\' 4"  (1.626 m), weight 212 lb (96.2 kg).     GENERAL: Well-developed, well-nourished female in no acute distress.  HEENT:  Normocephalic, atraumatic. Sclerae anicteric.  NECK: Supple. Normal thyroid.  LUNGS: Clear to auscultation bilaterally.  HEART: Regular rate and rhythm. BREASTS: Symmetric in size. No palpable masses or lymphadenopathy, skin changes, or nipple drainage. ABDOMEN: Soft, nontender, nondistended. No organomegaly. PELVIC: Normal external female genitalia. Vagina is pale and slightly atrophic.  Normal discharge. No adnexal mass or tenderness. EXTREMITIES: No cyanosis, clubbing, or edema, 2+ distal pulses.    Assessment:    Healthy female exam.      Plan:    Pap smear not indicated Screening mammogram ordered Colonoscopy referral placed Patient to follow up with PCP as scheduled  See After Visit Summary for Counseling Recommendations

## 2020-12-24 ENCOUNTER — Other Ambulatory Visit: Payer: Self-pay | Admitting: Family Medicine

## 2020-12-24 DIAGNOSIS — I10 Essential (primary) hypertension: Secondary | ICD-10-CM

## 2021-01-02 ENCOUNTER — Encounter: Payer: Self-pay | Admitting: Gastroenterology

## 2021-01-14 ENCOUNTER — Telehealth: Payer: Self-pay | Admitting: Internal Medicine

## 2021-01-14 DIAGNOSIS — E0822 Diabetes mellitus due to underlying condition with diabetic chronic kidney disease: Secondary | ICD-10-CM

## 2021-01-14 DIAGNOSIS — N182 Chronic kidney disease, stage 2 (mild): Secondary | ICD-10-CM

## 2021-01-14 NOTE — Telephone Encounter (Signed)
MEDICATION: ozempic and metformin  PHARMACY:   [facility] [place], [address] AT [address] Phone:  [phone]  Fax:  [phone]      HAS THE PATIENT CONTACTED THEIR PHARMACY?  no  IS THIS A 90 DAY SUPPLY : yes  IS PATIENT OUT OF MEDICATION: yes  IF NOT; HOW MUCH IS LEFT:   LAST APPOINTMENT DATE: @1 /03/2021  NEXT APPOINTMENT DATE:@4 /10/2020  DO WE HAVE YOUR PERMISSION TO LEAVE A DETAILED MESSAGE?:  OTHER COMMENTS:    **Let patient know to contact pharmacy at the end of the day to make sure medication is ready. **  ** Please notify patient to allow 48-72 hours to process**  **Encourage patient to contact the pharmacy for refills or they can request refills through [facility]**

## 2021-01-17 MED ORDER — METFORMIN HCL ER 500 MG PO TB24: 1500.0000 mg | ORAL_TABLET | Freq: Every day | ORAL | 0 refills | Status: DC

## 2021-01-17 NOTE — Telephone Encounter (Signed)
Rx for Metformin sent to preferred pharmacy. Pt advised via MyChart message Ozempic has refills.

## 2021-01-24 ENCOUNTER — Other Ambulatory Visit: Payer: Self-pay | Admitting: Family Medicine

## 2021-01-24 DIAGNOSIS — I10 Essential (primary) hypertension: Secondary | ICD-10-CM

## 2021-01-25 ENCOUNTER — Ambulatory Visit: Payer: 59 | Admitting: Internal Medicine

## 2021-01-25 NOTE — Progress Notes (Deleted)
Patient ID: Angela Barr, female   DOB: 12/10/1960, 60 y.o.   MRN: [id number]  This visit occurred during the SARS-CoV-2 public health emergency.  Safety protocols were in place, including screening questions prior to the visit, additional usage of staff PPE, and extensive cleaning of exam room while observing appropriate contact time as indicated for disinfecting solutions.   HPI: Angela Barr is a 60 y.o.-year-old female, returning for f/u for DM2, dx in 2007, non-insulin-dependent, uncontrolled, without long term complications, but with hyperglycemia. Last visit 4 months ago.  Interim history: Since last visit, she restarted [place]. She was able to restart Ozempic.  Reviewed HbA1c levels: Lab Results  Component Value Date   HGBA1C 7.0 (A) 09/25/2020   HGBA1C 6.1 (A) 05/15/2020   HGBA1C 7.1 (A) 01/10/2020   HGBA1C 6.4 (A) 09/12/2019   HGBA1C 6.7 (A) 05/12/2019   HGBA1C 7.2 (A) 01/19/2019   HGBA1C 6.6 (A) 09/17/2018   HGBA1C 6.4 (A) 05/14/2018   HGBA1C 7.3 02/08/2018   HGBA1C 6.6 11/11/2017   HGBA1C 6.6 08/11/2017   HGBA1C 6.6 04/01/2017   HGBA1C 6.3 11/14/2016   HGBA1C 6.6 08/15/2016   HGBA1C 6.4 05/15/2016   HGBA1C 7.5 (H) 02/05/2016   HGBA1C 7.1 09/25/2015   HGBA1C 7.7 06/25/2015   HGBA1C 7.6 (H) 03/23/2015   HGBA1C 11.1 (H) 12/25/2014  09/25/2015: HbA1c 7.1%  She is now on: - Metformin XR 500 mg with lunch and 1000 mg with dinner >> 1500 mg with dinner >> 500 mg in am and 1000 mg with dinner - Ozempic 0.5 mg weekly >> restarted 08/2020 Stop glimepiride 2 mg before dinner 08/2020 Stopped Jardiance due to price. Invokana caused yeast infections. Stopped Trulicity 1.5 mg >> stopped b/c Nausea and HAs, and also expensive - now tolerating it well, with mild nausea. She was Bydureon >> lumps at inj. Site.  She was on Victoza, now too expensive.  She is checking sugars 0 to once a day: - am: 113-121 >> 125-156 >> n/c >> 139-166, 183 - 2h after b'fast:  n/c >>  140, 151, 185 >> 191 >> n/c - before lunch:141-173 >> 120s, 190 >> n/c >> 127-148 - 2h after lunch: 144-220, 270 >> n/c >> 101-116 >> n/c - before dinner:  116 >> n/c >> 85, 98 >> n/c - 2h after dinner:  178 >> n/c >> 89 >> n/c - bedtime:  n/c >> 156 >> 102 >> 149 >> n/c - nighttime: n/c >> 70s occas. >> 129 >> n/c Lowest sugar was 89 >> 113 >> 125 >> 127; she has hypoglycemia awareness in the 90s. Highest sugar was 185 >> 191 >> 183.  Glucometer: One Touch Verio Flex   Eats sunflower seeds and nuts.  She stopped sodas but changed to sweet tea in the past.  I advised her to also stop this.  No CKD; Last BUN/creatinine:  Lab Results  Component Value Date   BUN 13 06/04/2020   CREATININE 0.49 06/04/2020   Normal ACR levels: Lab Results  Component Value Date   MICRALBCREAT 0.6 05/14/2018   MICRALBCREAT 0.7 02/05/2016   MICRALBCREAT 0.7 12/25/2014   MICRALBCREAT 2.0 06/13/2014   MICRALBCREAT 0.7 04/04/2013   MICRALBCREAT 0.5 01/30/2012   MICRALBCREAT 0.3 06/17/2011  We stopped lisinopril as she had low blood pressure and dizziness in the past. On losartan.  + HL; last set of lipids: Lab Results  Component Value Date   CHOL 126 06/04/2020   HDL 55.80 06/04/2020   LDLCALC 58 06/04/2020  TRIG 61.0 06/04/2020   CHOLHDL 2 06/04/2020  On Crestor 20. - last eye exam was in Summer 2021: No DR reportedly -She denies numbness and tingling in her feet.   ROS: Constitutional: no weight gain/+ weight loss, no fatigue, no subjective hyperthermia, no subjective hypothermia Eyes: no blurry vision, no xerophthalmia ENT: no sore throat, no nodules palpated in neck, no dysphagia, no odynophagia, no hoarseness Cardiovascular: no CP/no SOB/no palpitations/no leg swelling Respiratory: no cough/no SOB/no wheezing Gastrointestinal: no N/no V/no D/no C/no acid reflux Musculoskeletal: no muscle aches/no joint aches Skin: no rashes, no hair loss Neurological: no tremors/no numbness/no  tingling/no dizziness  I reviewed pt's medications, allergies, PMH, social hx, family hx, and changes were documented in the history of present illness. Otherwise, unchanged from my initial visit note.  Past Medical History:  Diagnosis Date  . Arthritis    Hips   . Colon polyps 2008   [name]  . Diabetes mellitus 2006  . Hyperlipemia 2012  . Hypertension 2012   off meds at 01-08-18 [facility]   . Obesity   . Palpitations    Past Surgical History:  Procedure Laterality Date  . BREAST BIOPSY  2003  . COLONOSCOPY  07/07/2011  . POLYPECTOMY    . TUBAL LIGATION  1990  . VAGINAL HYSTERECTOMY  2006  . WISDOM TOOTH EXTRACTION     History   Social History  . Marital Status: Married    Spouse Name: N/A  . Number of Children: 3   Occupational History  . RN    Social History Main Topics  . Smoking status: Never Smoker   . Smokeless tobacco: Never Used  . Alcohol Use: No  . Drug Use: No   Social History Narrative   3 caffeine drinks daily    Current Outpatient Medications on File Prior to Visit  Medication Sig Dispense Refill  . losartan (COZAAR) 100 MG tablet Take 1 tablet (100 mg total) by mouth daily. Pt needs OV for further refills 30 tablet 0  . metFORMIN (GLUCOPHAGE-XR) 500 MG 24 hr tablet Take 3 tablets (1,500 mg total) by mouth daily with breakfast. 90 tablet 0  . Multiple Vitamin (MULTIVITAMIN ADULT PO) Take by mouth.    [name] VERIO test strip CHECK YOUR BLOOD SUGAR TWICE DAILY 100 strip 11  . rosuvastatin (CRESTOR) 20 MG tablet Take 1 tablet (20 mg total) by mouth at bedtime. 90 tablet 1  . Semaglutide,0.25 or 0.5MG/DOS, (OZEMPIC, 0.25 OR 0.5 MG/DOSE,) 2 MG/1.5ML SOPN Inject 0.5 mg into the skin once a week. 4.5 mL 3  . Sodium Sulfate-Mag Sulfate-KCl (SUTAB) [phone] MG TABS Take 1 kit by mouth as directed. BIN: [id number] PCN: CN GROUP: [id number] MEMBER ID: [id number];CBJ AS CASH (Patient not taking: Reported on 06/04/2020) 24 tablet 0   No current  facility-administered medications on file prior to visit.   Allergies  Allergen Reactions  . Bactrim Nausea Only  . Sulfa Antibiotics Nausea And Vomiting  . Sulfamethoxazole-Trimethoprim    Family History  Problem Relation Age of Onset  . Heart failure Father   . Hypertension Father   . Colon cancer Father 43  . Diabetes Father   . Heart disease Father   . Sudden death Father   . Obesity Father   . Hypertension Mother   . High Cholesterol Mother   . Stroke Mother   . Breast cancer Maternal Grandmother   . Diabetes Paternal Grandmother   . Colon polyps Neg Hx   . Esophageal cancer  Neg Hx   . Rectal cancer Neg Hx   . Stomach cancer Neg Hx    PE: There were no vitals taken for this visit. There is no height or weight on file to calculate BMI. Wt Readings from Last 3 Encounters:  12/18/20 212 lb (96.2 kg)  09/25/20 218 lb 12.8 oz (99.2 kg)  06/04/20 214 lb 9.6 oz (97.3 kg)   Constitutional: overweight, in NAD Eyes: PERRLA, EOMI, no exophthalmos ENT: moist mucous membranes, no thyromegaly, no cervical lymphadenopathy Cardiovascular: RRR, No MRG Respiratory: CTA B Gastrointestinal: abdomen soft, NT, ND, BS+ Musculoskeletal: no deformities, strength intact in all 4 Skin: moist, warm, no rashes Neurological: no tremor with outstretched hands, DTR normal in all 4  ASSESSMENT: 1. DM2, non-insulin-dependent, now better controlled, without long term complications, but with hyperglycemia  2. Obesity class 2 BMI Classification:  < 18.5 underweight   18.5-24.9 normal weight   25.0-29.9 overweight   30.0-34.9 class I obesity   35.0-39.9 class II obesity  ? 40.0 class III obesity   3. HL  PLAN:  1. Patient with longstanding, uncontrolled, type 2 diabetes, with improved control on Optavia diet.  In the past she was on an SGLT2 inhibitor and a GLP-1 receptor agonist but she had to come off before last visit due to price.  She also stopped the [place] before last  visit and gained 5 pounds off it.  She was planning to restart the diet and at last visit, I also suggested to restart Ozempic if affordable.  We stopped glimepiride.  At that time, sugars were higher, almost all above goal, but she was only checking the first half of the day.  I advised her to check at other times of the day, also.  HbA1c was 7.0%, higher.  - I recommended:  Patient Instructions  Please continue: - Metformin XR 500 mg in am and 1000 mg with dinner - Ozempic 0.5 mg weekly  Please return in 4 months with your sugar log.   - we checked her HbA1c: 7%  - advised to check sugars at different times of the day - 1x a day, rotating check times - advised for yearly eye exams >> she is UTD - return to clinic in 4 months  2. Obesity class 2 -In the past she was on an SGLT2 inhibitor and GLP-1 receptor agonist but she had to come off due to price.  She did very well after coming off on the [employer].  She came off afterwards and gained 5 pounds.  At last visit, she was planning to restart this diet and I also recommended to try to restart Ozempic if affordable. -She went to the [facility] in the past but she did not feel that this was a good fit for her -We discussed in the past about cutting out sweet tea -At last visit we discussed about improving diet and cut out sweet tea and she was planning to reestablish care with the [facility] but she did not feel this was a good fit for her.  3. HL -Reviewed latest lipid panel from 05/2020: All fractions at goal: Lab Results  Component Value Date   CHOL 126 06/04/2020   HDL 55.80 06/04/2020   LDLCALC 58 06/04/2020   TRIG 61.0 06/04/2020   CHOLHDL 2 06/04/2020  -Continues Crestor 20 mg daily without side effects  Philemon Kingdom, MD PhD [facility] Endocrinology

## 2021-01-29 ENCOUNTER — Ambulatory Visit (INDEPENDENT_AMBULATORY_CARE_PROVIDER_SITE_OTHER): Payer: 59 | Admitting: Internal Medicine

## 2021-01-29 ENCOUNTER — Encounter: Payer: Self-pay | Admitting: Internal Medicine

## 2021-01-29 ENCOUNTER — Other Ambulatory Visit: Payer: Self-pay

## 2021-01-29 VITALS — BP 120/82 | HR 92 | Ht 64.0 in | Wt 209.4 lb

## 2021-01-29 DIAGNOSIS — E785 Hyperlipidemia, unspecified: Secondary | ICD-10-CM | POA: Diagnosis not present

## 2021-01-29 DIAGNOSIS — E1165 Type 2 diabetes mellitus with hyperglycemia: Secondary | ICD-10-CM | POA: Diagnosis not present

## 2021-01-29 DIAGNOSIS — E669 Obesity, unspecified: Secondary | ICD-10-CM

## 2021-01-29 LAB — POCT GLYCOSYLATED HEMOGLOBIN (HGB A1C): Hemoglobin A1C: 6.4 % — AB (ref 4.0–5.6)

## 2021-01-29 NOTE — Patient Instructions (Addendum)
Please continue: - Metformin XR 500 mg in am and 1000 mg with dinner - Ozempic 0.5 mg weekly  Check sugrs 1x a day, rotating check times.  Please return in 4 months with your sugar log.

## 2021-01-29 NOTE — Progress Notes (Signed)
Patient ID: Angela Barr, female   DOB: [birth year], 60 y.o.   MRN: [id number]  This visit occurred during the SARS-CoV-2 public health emergency.  Safety protocols were in place, including screening questions prior to the visit, additional usage of staff PPE, and extensive cleaning of exam room while observing appropriate contact time as indicated for disinfecting solutions.   HPI: Angela Barr is a 60 y.o.-year-old female, returning for f/u for DM2, dx in 2007, non-insulin-dependent, uncontrolled, without long term complications, but with hyperglycemia. Last visit 4 months ago.  Interim history: Since last visit, she restarted Optavia diet - off and on. She was able to restart Ozempic. She tolerates it well, no nausea or other GI sxs. She tries to walk 2 mi a day. She has occasional vertigo, ear popping.  Reviewed HbA1c levels: Lab Results  Component Value Date   HGBA1C 7.0 (A) 09/25/2020   HGBA1C 6.1 (A) 05/15/2020   HGBA1C 7.1 (A) 01/10/2020   HGBA1C 6.4 (A) 09/12/2019   HGBA1C 6.7 (A) 05/12/2019   HGBA1C 7.2 (A) 01/19/2019   HGBA1C 6.6 (A) 09/17/2018   HGBA1C 6.4 (A) 05/14/2018   HGBA1C 7.3 02/08/2018   HGBA1C 6.6 11/11/2017   HGBA1C 6.6 08/11/2017   HGBA1C 6.6 04/01/2017   HGBA1C 6.3 11/14/2016   HGBA1C 6.6 08/15/2016   HGBA1C 6.4 05/15/2016   HGBA1C 7.5 (H) 02/05/2016   HGBA1C 7.1 09/25/2015   HGBA1C 7.7 06/25/2015   HGBA1C 7.6 (H) 03/23/2015   HGBA1C 11.1 (H) 12/25/2014  09/25/2015: HbA1c 7.1%  She is now on: - Metformin XR 500 mg with lunch and 1000 mg with dinner >> 1500 mg with dinner >> 500 mg in am and 1000 mg with dinner - Ozempic 0.5 mg weekly >> restarted 08/2020 Stop glimepiride 2 mg before dinner 08/2020 Stopped Jardiance due to price. Invokana caused yeast infections. Stopped Trulicity 1.5 mg >> stopped b/c Nausea and HAs, and also expensive - now tolerating it well, with mild nausea. She was Bydureon >> lumps at inj. Site.  She was on Victoza, now  too expensive.  She is checking sugars 0 to once a day per meter download: - am: 125-156 >> n/c >> 139-166, 183 >> 119, 149, 166 - 2h after b'fast: 140, 151, 185 >> 191 >> n/c >> 189 - before lunch: 120s, 190 >> n/c >> 127-148 >> 102 - 2h after lunch: 144-220, 270 >> n/c >> 101-116 >> n/c - before dinner:  116 >> n/c >> 85, 98 >> n/c - 2h after dinner:  178 >> n/c >> 89 >> n/c - bedtime:  n/c >> 156 >> 102 >> 149 >> n/c - nighttime: n/c >> 70s occas. >> 129 >> n/c Lowest sugar was 89 >> 113 >> 125 >> 127 >> 119; she has hypoglycemia awareness in the 90s. Highest sugar was 185 >> 191 >> 183 >> 189.  Glucometer: One Touch Verio Flex   Eats sunflower seeds and nuts.  She stopped sodas but changed to sweet tea in the past.  I advised her to also stop this.  No CKD; Last BUN/creatinine:  Lab Results  Component Value Date   BUN 13 06/04/2020   CREATININE 0.49 06/04/2020   Normal ACR levels: Lab Results  Component Value Date   MICRALBCREAT 0.6 05/14/2018   MICRALBCREAT 0.7 02/05/2016   MICRALBCREAT 0.7 12/25/2014   MICRALBCREAT 2.0 06/13/2014   MICRALBCREAT 0.7 04/04/2013   MICRALBCREAT 0.5 01/30/2012   MICRALBCREAT 0.3 06/17/2011  We stopped lisinopril as she had low blood  pressure and dizziness in the past. On losartan.  + HL; last set of lipids: Lab Results  Component Value Date   CHOL 126 06/04/2020   HDL 55.80 06/04/2020   LDLCALC 58 06/04/2020   TRIG 61.0 06/04/2020   CHOLHDL 2 06/04/2020  On Crestor 20 - inconsistent dosing - may forget. - last eye exam was in Summer 2021: No DR reportedly -She denies numbness and tingling in her feet.   ROS: Constitutional: no weight gain/+ weight loss, no fatigue, no subjective hyperthermia, no subjective hypothermia Eyes: no blurry vision, no xerophthalmia ENT: no sore throat, no nodules palpated in neck, no dysphagia, no odynophagia, no hoarseness Cardiovascular: no CP/no SOB/no palpitations/no leg swelling Respiratory: no  cough/no SOB/no wheezing Gastrointestinal: no N/no V/no D/no C/no acid reflux Musculoskeletal: no muscle aches/no joint aches Skin: no rashes, no hair loss Neurological: no tremors/no numbness/no tingling/no dizziness  I reviewed pt's medications, allergies, PMH, social hx, family hx, and changes were documented in the history of present illness. Otherwise, unchanged from my initial visit note.  Past Medical History:  Diagnosis Date  . Arthritis    Hips   . Colon polyps 2008   [name]  . Diabetes mellitus 2006  . Hyperlipemia 2012  . Hypertension 2012   off meds at 01-08-18 [facility]   . Obesity   . Palpitations    Past Surgical History:  Procedure Laterality Date  . BREAST BIOPSY  2003  . COLONOSCOPY  07/07/2011  . POLYPECTOMY    . TUBAL LIGATION  1990  . VAGINAL HYSTERECTOMY  2006  . WISDOM TOOTH EXTRACTION     History   Social History  . Marital Status: Married    Spouse Name: N/A  . Number of Children: 3   Occupational History  . RN    Social History Main Topics  . Smoking status: Never Smoker   . Smokeless tobacco: Never Used  . Alcohol Use: No  . Drug Use: No   Social History Narrative   3 caffeine drinks daily    Current Outpatient Medications on File Prior to Visit  Medication Sig Dispense Refill  . losartan (COZAAR) 100 MG tablet Take 1 tablet (100 mg total) by mouth daily. Pt needs OV for further refills 30 tablet 0  . metFORMIN (GLUCOPHAGE-XR) 500 MG 24 hr tablet Take 3 tablets (1,500 mg total) by mouth daily with breakfast. 90 tablet 0  . Multiple Vitamin (MULTIVITAMIN ADULT PO) Take by mouth.    [name] VERIO test strip CHECK YOUR BLOOD SUGAR TWICE DAILY 100 strip 11  . rosuvastatin (CRESTOR) 20 MG tablet Take 1 tablet (20 mg total) by mouth at bedtime. 90 tablet 1  . Semaglutide,0.25 or 0.5MG/DOS, (OZEMPIC, 0.25 OR 0.5 MG/DOSE,) 2 MG/1.5ML SOPN Inject 0.5 mg into the skin once a week. 4.5 mL 3  . Sodium Sulfate-Mag Sulfate-KCl (SUTAB)  [phone] MG TABS Take 1 kit by mouth as directed. BIN: [id number] PCN: CN GROUP: [id number] MEMBER ID: [id number];WNI AS CASH (Patient not taking: Reported on 06/04/2020) 24 tablet 0   No current facility-administered medications on file prior to visit.   Allergies  Allergen Reactions  . Bactrim Nausea Only  . Sulfa Antibiotics Nausea And Vomiting  . Sulfamethoxazole-Trimethoprim    Family History  Problem Relation Age of Onset  . Heart failure Father   . Hypertension Father   . Colon cancer Father 34  . Diabetes Father   . Heart disease Father   . Sudden death Father   .  Obesity Father   . Hypertension Mother   . High Cholesterol Mother   . Stroke Mother   . Breast cancer Maternal Grandmother   . Diabetes Paternal Grandmother   . Colon polyps Neg Hx   . Esophageal cancer Neg Hx   . Rectal cancer Neg Hx   . Stomach cancer Neg Hx    PE: BP 120/82 (BP Location: Right Arm, Patient Position: Sitting, Cuff Size: Normal)   Pulse 92   Ht 5' 4" (1.626 m)   Wt 209 lb 6.4 oz (95 kg)   SpO2 97%   BMI 35.94 kg/m  Body mass index is 35.94 kg/m. Wt Readings from Last 3 Encounters:  01/29/21 209 lb 6.4 oz (95 kg)  12/18/20 212 lb (96.2 kg)  09/25/20 218 lb 12.8 oz (99.2 kg)   Constitutional: overweight, in NAD Eyes: PERRLA, EOMI, no exophthalmos ENT: moist mucous membranes, no thyromegaly, no cervical lymphadenopathy Cardiovascular: RRR, No MRG Respiratory: CTA B Gastrointestinal: abdomen soft, NT, ND, BS+ Musculoskeletal: no deformities, strength intact in all 4 Skin: moist, warm, no rashes Neurological: no tremor with outstretched hands, DTR normal in all 4  ASSESSMENT: 1. DM2, non-insulin-dependent, now better controlled, without long term complications, but with hyperglycemia  2. Obesity class 2 BMI Classification:  < 18.5 underweight   18.5-24.9 normal weight   25.0-29.9 overweight   30.0-34.9 class I obesity   35.0-39.9 class II obesity  ? 40.0 class III  obesity   3. HL  PLAN:  1. Patient with longstanding, uncontrolled, type 2 diabetes, with improved control on Optavia your diet.  In the past, she was on SGLT2 inhibitor and a GLP-1 receptor agonist but she had to come off before last visit due to price.  She also stopped the diet before last visit and gained 5 pounds off it and she was planning to restart at last visit.  I also suggested to restart Ozempic at that time, if affordable.  We stopped glimepiride.  Sugars are higher, almost all above goal but she was only checking the first half of the day and I advised her to also check at different times of the day. HbA1c was 7.0%, higher -At this visit, sugars have improved slightly, however, she is not checking the blood sugars in the 2 detections.  She had higher blood sugars in the morning actually as she is eating popcorn at night.  She is not checking later in the day and I strongly advised her to start. - we checked her HbA1c: 6.4% (lower) -Therefore, for now, we will continue the same regimen.  She is interested in reducing the Metformin dose and, due to the higher blood sugars in the morning, we discussed about possibly stopping the morning Metformin if she absolutely wants to do so, but I would probably wait until next visit to make sure that the blood sugar stabilized in a lower range before doing so. - I recommended:  Patient Instructions  Please continue: - Metformin XR 500 mg in am and 1000 mg with dinner - Ozempic 0.5 mg weekly  Please return in 4 months with your sugar log.   - advised to check sugars at different times of the day - 1x a day, rotating check times - advised for yearly eye exams >> she is UTD - return to clinic in 3-4 months  2. Obesity class 2 -In the past, she was on initial VGo30 GLP-1 receptor agonist but she had to come off due to price.  She did  very well after coming off these meds, on the [place].  However, afterwards she came off and gained 5 pounds.   She was planning to restart this at last visit and we also added Ozempic, which should also help -Of note, she was seen in the [facility] in the past but she did not feel that this was a good fit for her -We discussed in the past about cutting out sweet tea -She lost 3 pounds since last visit  3. HL -Reviewed latest lipid panel from 05/2020: All fractions at goal Lab Results  Component Value Date   CHOL 126 06/04/2020   HDL 55.80 06/04/2020   LDLCALC 58 06/04/2020   TRIG 61.0 06/04/2020   CHOLHDL 2 06/04/2020  -Continues Crestor 20 without side effects  Philemon Kingdom, MD PhD [facility] Endocrinology

## 2021-02-27 ENCOUNTER — Other Ambulatory Visit: Payer: Self-pay | Admitting: Family Medicine

## 2021-02-27 DIAGNOSIS — I10 Essential (primary) hypertension: Secondary | ICD-10-CM

## 2021-03-15 ENCOUNTER — Other Ambulatory Visit: Payer: Self-pay

## 2021-03-15 ENCOUNTER — Ambulatory Visit (AMBULATORY_SURGERY_CENTER): Payer: 59

## 2021-03-15 VITALS — Ht 64.5 in | Wt 199.0 lb

## 2021-03-15 DIAGNOSIS — Z86010 Personal history of colonic polyps: Secondary | ICD-10-CM

## 2021-03-15 DIAGNOSIS — Z800 Family history of malignant neoplasm of digestive organs: Secondary | ICD-10-CM

## 2021-03-15 NOTE — Progress Notes (Signed)
Pre visit completed via phone call; Patient verified name, DOB, and address; No egg or soy allergy known to patient  No issues with past sedation with any surgeries or procedures Patient denies ever being told they had issues or difficulty with intubation  No FH of Malignant Hyperthermia No diet pills per patient No home 02 use per patient  No blood thinners per patient  Pt denies issues with constipation  No A fib or A flutter  EMMI video via MyChart  COVID 19 guidelines implemented in [place] today with Pt and RN  Pt is fully vaccinated for Covid x 2; NO PA's for preps discussed with pt in [facility] today  Discussed with pt there will be an out-of-pocket cost for prep and that varies from $0 to 70 dollars  Due to the COVID-19 pandemic we are asking patients to follow certain guidelines.  Pt aware of COVID protocols and [facility] guidelines

## 2021-03-19 ENCOUNTER — Telehealth: Payer: Self-pay | Admitting: Internal Medicine

## 2021-03-19 DIAGNOSIS — E0822 Diabetes mellitus due to underlying condition with diabetic chronic kidney disease: Secondary | ICD-10-CM

## 2021-03-19 MED ORDER — METFORMIN HCL ER 500 MG PO TB24: 1500.0000 mg | ORAL_TABLET | Freq: Every day | ORAL | 2 refills | Status: DC

## 2021-03-19 NOTE — Telephone Encounter (Signed)
Rx sent to preferred pharmacy.

## 2021-03-19 NOTE — Telephone Encounter (Signed)
MEDICATION: metFORMIN (GLUCOPHAGE-XR) 500 MG 24 hr tablet  PHARMACY:   [id number] - [place], [place] - [address] AT [place] Phone:  [phone]  Fax:  [phone]       HAS THE PATIENT CONTACTED [place]? Yes-requires new 90 day RX   IS THIS A 90 DAY SUPPLY : Yes-required  IS PATIENT OUT OF MEDICATION: Yes  IF NOT; HOW MUCH IS LEFT: 0  LAST APPOINTMENT DATE: @4 /02/2021  NEXT APPOINTMENT DATE:@8 /19/2022  DO WE HAVE YOUR PERMISSION TO LEAVE A DETAILED MESSAGE?: Yes  OTHER COMMENTS:    **Let patient know to contact pharmacy at the end of the day to make sure medication is ready. **  ** Please notify patient to allow 48-72 hours to process**  **Encourage patient to contact the pharmacy for refills or they can request refills through [facility]**

## 2021-03-22 ENCOUNTER — Encounter: Payer: Self-pay | Admitting: Gastroenterology

## 2021-03-26 ENCOUNTER — Ambulatory Visit (INDEPENDENT_AMBULATORY_CARE_PROVIDER_SITE_OTHER): Payer: 59 | Admitting: Family Medicine

## 2021-03-26 ENCOUNTER — Encounter: Payer: Self-pay | Admitting: Family Medicine

## 2021-03-26 ENCOUNTER — Other Ambulatory Visit: Payer: Self-pay

## 2021-03-26 VITALS — BP 110/68 | HR 88 | Temp 98.4°F | Resp 18 | Ht 64.5 in | Wt 209.6 lb

## 2021-03-26 DIAGNOSIS — I10 Essential (primary) hypertension: Secondary | ICD-10-CM | POA: Diagnosis not present

## 2021-03-26 DIAGNOSIS — E1151 Type 2 diabetes mellitus with diabetic peripheral angiopathy without gangrene: Secondary | ICD-10-CM

## 2021-03-26 DIAGNOSIS — E1165 Type 2 diabetes mellitus with hyperglycemia: Secondary | ICD-10-CM

## 2021-03-26 DIAGNOSIS — Z111 Encounter for screening for respiratory tuberculosis: Secondary | ICD-10-CM | POA: Diagnosis not present

## 2021-03-26 DIAGNOSIS — E1169 Type 2 diabetes mellitus with other specified complication: Secondary | ICD-10-CM

## 2021-03-26 DIAGNOSIS — IMO0002 Reserved for concepts with insufficient information to code with codable children: Secondary | ICD-10-CM

## 2021-03-26 DIAGNOSIS — Z0000 Encounter for general adult medical examination without abnormal findings: Secondary | ICD-10-CM | POA: Insufficient documentation

## 2021-03-26 DIAGNOSIS — E785 Hyperlipidemia, unspecified: Secondary | ICD-10-CM

## 2021-03-26 DIAGNOSIS — Z6838 Body mass index (BMI) 38.0-38.9, adult: Secondary | ICD-10-CM

## 2021-03-26 MED ORDER — LOSARTAN POTASSIUM 100 MG PO TABS: ORAL_TABLET | ORAL | 1 refills | Status: DC

## 2021-03-26 NOTE — Patient Instructions (Signed)
Preventive Care 59-59 Years Old, Female Preventive care refers to lifestyle choices and visits with your health care provider that can promote health and wellness. This includes:  A yearly physical exam. This is also called an annual wellness visit.  Regular dental and eye exams.  Immunizations.  Screening for certain conditions.  Healthy lifestyle choices, such as: ? Eating a healthy diet. ? Getting regular exercise. ? Not using drugs or products that contain nicotine and tobacco. ? Limiting alcohol use. What can I expect for my preventive care visit? Physical exam Your health care provider will check your:  Height and weight. These may be used to calculate your BMI (body mass index). BMI is a measurement that tells if you are at a healthy weight.  Heart rate and blood pressure.  Body temperature.  Skin for abnormal spots. Counseling Your health care provider may ask you questions about your:  Past medical problems.  Family's medical history.  Alcohol, tobacco, and drug use.  Emotional well-being.  Home life and relationship well-being.  Sexual activity.  Diet, exercise, and sleep habits.  Work and work Statistician.  Access to firearms.  Method of birth control.  Menstrual cycle.  Pregnancy history. What immunizations do I need? Vaccines are usually given at various ages, according to a schedule. Your health care provider will recommend vaccines for you based on your age, medical history, and lifestyle or other factors, such as travel or where you work.   What tests do I need? Blood tests  Lipid and cholesterol levels. These may be checked every 5 years, or more often if you are over 59 years old.  Hepatitis C test.  Hepatitis B test. Screening  Lung cancer screening. You may have this screening every year starting at age 59 if you have a 30-pack-year history of smoking and currently smoke or have quit within the past 15 years.  Colorectal cancer  screening. ? All adults should have this screening starting at age 59 and continuing until age 59. ? Your health care provider may recommend screening at age 59 if you are at increased risk. ? You will have tests every 1-10 years, depending on your results and the type of screening test.  Diabetes screening. ? This is done by checking your blood sugar (glucose) after you have not eaten for a while (fasting). ? You may have this done every 1-3 years.  Mammogram. ? This may be done every 1-2 years. ? Talk with your health care provider about when you should start having regular mammograms. This may depend on whether you have a family history of breast cancer.  BRCA-related cancer screening. This may be done if you have a family history of breast, ovarian, tubal, or peritoneal cancers.  Pelvic exam and Pap test. ? This may be done every 3 years starting at age 59. ? Starting at age 41, this may be done every 5 years if you have a Pap test in combination with an HPV test. Other tests  STD (sexually transmitted disease) testing, if you are at risk.  Bone density scan. This is done to screen for osteoporosis. You may have this scan if you are at high risk for osteoporosis. Talk with your health care provider about your test results, treatment options, and if necessary, the need for more tests. Follow these instructions at home: Eating and drinking  Eat a diet that includes fresh fruits and vegetables, whole grains, lean protein, and low-fat dairy products.  Take vitamin and mineral supplements  as recommended by your health care provider.  Do not drink alcohol if: ? Your health care provider tells you not to drink. ? You are pregnant, may be pregnant, or are planning to become pregnant.  If you drink alcohol: ? Limit how much you have to 0-1 drink a day. ? Be aware of how much alcohol is in your drink. In the [place] one drink equals one 12 oz bottle of beer (355 mL), one 5 oz glass of  wine (148 mL), or one 1 oz glass of hard liquor (44 mL).   Lifestyle  Take daily care of your teeth and gums. Brush your teeth every morning and night with fluoride toothpaste. Floss one time each day.  Stay active. Exercise for at least 30 minutes 5 or more days each week.  Do not use any products that contain nicotine or tobacco, such as cigarettes, e-cigarettes, and chewing tobacco. If you need help quitting, ask your health care provider.  Do not use drugs.  If you are sexually active, practice safe sex. Use a condom or other form of protection to prevent STIs (sexually transmitted infections).  If you do not wish to become pregnant, use a form of birth control. If you plan to become pregnant, see your health care provider for a prepregnancy visit.  If told by your health care provider, take low-dose aspirin daily starting at age 50.  Find healthy ways to cope with stress, such as: ? Meditation, yoga, or listening to music. ? Journaling. ? Talking to a trusted person. ? Spending time with friends and family. Safety  Always wear your seat belt while driving or riding in a vehicle.  Do not drive: ? If you have been drinking alcohol. Do not ride with someone who has been drinking. ? When you are tired or distracted. ? While texting.  Wear a helmet and other protective equipment during sports activities.  If you have firearms in your house, make sure you follow all gun safety procedures. What's next?  Visit your health care provider once a year for an annual wellness visit.  Ask your health care provider how often you should have your eyes and teeth checked.  Stay up to date on all vaccines. This information is not intended to replace advice given to you by your health care provider. Make sure you discuss any questions you have with your health care provider. Document Revised: 07/17/2020 Document Reviewed: 06/24/2018 Elsevier Patient Education  2021 Elsevier Inc.  

## 2021-03-26 NOTE — Assessment & Plan Note (Signed)
ghm utd Check labs  Colon to be done soon Mammogram to be done

## 2021-03-26 NOTE — Assessment & Plan Note (Signed)
Well controlled, no changes to meds. Encouraged heart healthy diet such as the DASH diet and exercise as tolerated.  °

## 2021-03-26 NOTE — Assessment & Plan Note (Signed)
Encouraged heart healthy diet, increase exercise, avoid trans fats, consider a krill oil cap daily 

## 2021-03-26 NOTE — Assessment & Plan Note (Signed)
Per endo °

## 2021-03-26 NOTE — Progress Notes (Signed)
Subjective:   By signing my name below, I, Shehryar Baig, attest that this documentation has been prepared under the direction and in the presence of Dr. Roma Schanz, DO. 03/26/2021     Patient ID: Angela Barr, female    DOB: 26-Dec-1960, 60 y.o.   MRN: [id number]  Chief Complaint  Patient presents with  . Annual Exam    Pt states fasting     HPI Patient is in today for a comprehensive physical exam. She is seeing Dr. Cruzita Lederer to manage her diabetes. She continues taking 500 mg metformin daily PO and ozempic injectoins to manage her diabetes at this time.  Her blood pressure is well managed at this time. She continues taking 100 mg losartan daily PO to manage her hypertension. She denies having any abdominal pain, joint pain at this time. She has 2 moderna Covid-19 vaccines at this time and she is not interested in getting the booster vaccines at this time. She is due for a booster pneumonia vaccine. Her workplace is requiring her to get the TB vaccine. She has a an upcoming mammogram appointment. She has a upcoming colonoscopy appointment but has to reschedule it because she did not follow the diet. She had recently gotten her pap smear done by her OBGYN.    Past Medical History:  Diagnosis Date  . Arthritis    bilateral hips  . Colon polyps 2008   [name]  . Diabetes mellitus 2006   on meds  . Hyperlipemia 2012   on meds  . Hypertension 2012   on meds  . Obesity   . Palpitations     Past Surgical History:  Procedure Laterality Date  . BREAST BIOPSY Right 2003  . COLONOSCOPY  07/07/2011   RK-F/V-suprep(exc)-TA x 1  . POLYPECTOMY    . TUBAL LIGATION  1990  . VAGINAL HYSTERECTOMY  2006  . WISDOM TOOTH EXTRACTION      Family History  Problem Relation Age of Onset  . Heart failure Father   . Hypertension Father   . Colon cancer Father 70  . Diabetes Father   . Heart disease Father   . Sudden death Father   . Obesity Father   . Colon polyps  Father 2  . Hypertension Mother   . High Cholesterol Mother   . Stroke Mother   . Breast cancer Maternal Grandmother   . Diabetes Paternal Grandmother   . Esophageal cancer Neg Hx   . Rectal cancer Neg Hx   . Stomach cancer Neg Hx     Social History   Socioeconomic History  . Marital status: Married    Spouse name: Jenny Reichmann  . Number of children: 3  . Years of education: Not on file  . Highest education level: Not on file  Occupational History  . Occupation: Therapist, sports  Tobacco Use  . Smoking status: Never Smoker  . Smokeless tobacco: Never Used  Vaping Use  . Vaping Use: Never used  Substance and Sexual Activity  . Alcohol use: No  . Drug use: No  . Sexual activity: Yes    Partners: Male    Birth control/protection: Surgical  Other Topics Concern  . Not on file  Social History Narrative   3 caffeine drinks daily.   Exercise-no   Lives at home with husband    Social Determinants of Health   Financial Resource Strain: Not on file  Food Insecurity: Not on file  Transportation Needs: Not on file  Physical Activity: Not on  file  Stress: Not on file  Social Connections: Not on file  Intimate Partner Violence: Not on file    Outpatient Medications Prior to Visit  Medication Sig Dispense Refill  . metFORMIN (GLUCOPHAGE-XR) 500 MG 24 hr tablet Take 3 tablets (1,500 mg total) by mouth daily with breakfast. 270 tablet 2  . Multiple Vitamin (MULTIVITAMIN ADULT PO) Take by mouth.    [name] VERIO test strip CHECK YOUR BLOOD SUGAR TWICE DAILY 100 strip 11  . rosuvastatin (CRESTOR) 20 MG tablet Take 1 tablet (20 mg total) by mouth at bedtime. 90 tablet 1  . Semaglutide,0.25 or 0.5MG /DOS, (OZEMPIC, 0.25 OR 0.5 MG/DOSE,) 2 MG/1.5ML SOPN Inject 0.5 mg into the skin once a week. 4.5 mL 3  . VITAMIN D PO Take 1 tablet by mouth daily at 6 (six) AM.    . losartan (COZAAR) 100 MG tablet TAKE 1 TABLET(100 MG) BY MOUTH DAILY 30 tablet 0   No facility-administered medications prior to  visit.    Allergies  Allergen Reactions  . Bactrim Nausea Only  . Sulfa Antibiotics Nausea And Vomiting  . Sulfamethoxazole-Trimethoprim     Review of Systems  Constitutional: Negative for fever and malaise/fatigue.  HENT: Negative for congestion.   Eyes: Negative for blurred vision.  Respiratory: Negative for shortness of breath.   Cardiovascular: Negative for chest pain, palpitations and leg swelling.  Gastrointestinal: Negative for abdominal pain, blood in stool and nausea.  Genitourinary: Negative for dysuria and frequency.  Musculoskeletal: Negative for falls and joint pain.  Skin: Negative for rash.  Neurological: Negative for dizziness, loss of consciousness and headaches.  Endo/Heme/Allergies: Negative for environmental allergies.  Psychiatric/Behavioral: Negative for depression. The patient is not nervous/anxious.        Objective:    Physical Exam Vitals and nursing note reviewed.  Constitutional:      General: She is not in acute distress.    Appearance: Normal appearance. She is not ill-appearing.  HENT:     Head: Normocephalic and atraumatic.     Right Ear: Tympanic membrane and external ear normal.     Left Ear: Tympanic membrane and external ear normal.  Eyes:     Extraocular Movements: Extraocular movements intact.     Pupils: Pupils are equal, round, and reactive to light.  Cardiovascular:     Rate and Rhythm: Normal rate and regular rhythm.     Pulses: Normal pulses.     Heart sounds: Normal heart sounds. No murmur heard. No gallop.   Pulmonary:     Effort: Pulmonary effort is normal. No respiratory distress.     Breath sounds: Normal breath sounds. No wheezing, rhonchi or rales.  Abdominal:     General: Bowel sounds are normal. There is no distension.     Palpations: Abdomen is soft. There is no mass.     Tenderness: There is no abdominal tenderness. There is no guarding or rebound.     Hernia: No hernia is present.  Skin:    General: Skin is  warm and dry.  Neurological:     Mental Status: She is alert and oriented to person, place, and time.  Psychiatric:        Behavior: Behavior normal.     BP 110/68 (BP Location: Right Arm, Patient Position: Sitting, Cuff Size: Large)   Pulse 88   Temp 98.4 F (36.9 C) (Oral)   Resp 18   Ht 5' 4.5" (1.638 m)   Wt 209 lb 9.6 oz (95.1 kg)  SpO2 98%   BMI 35.42 kg/m  Wt Readings from Last 3 Encounters:  03/26/21 209 lb 9.6 oz (95.1 kg)  03/15/21 199 lb (90.3 kg)  01/29/21 209 lb 6.4 oz (95 kg)    Diabetic Foot Exam - Simple   No data filed    Lab Results  Component Value Date   WBC 7.0 03/09/2020   HGB 13.1 03/09/2020   HCT 40.2 03/09/2020   PLT 218.0 03/09/2020   GLUCOSE 107 (H) 06/04/2020   CHOL 126 06/04/2020   TRIG 61.0 06/04/2020   HDL 55.80 06/04/2020   LDLCALC 58 06/04/2020   ALT 26 06/04/2020   AST 16 06/04/2020   NA 140 06/04/2020   K 3.8 06/04/2020   CL 104 06/04/2020   CREATININE 0.49 06/04/2020   BUN 13 06/04/2020   CO2 28 06/04/2020   TSH 0.43 11/15/2019   HGBA1C 6.4 (A) 01/29/2021   MICROALBUR 0.8 05/14/2018    Lab Results  Component Value Date   TSH 0.43 11/15/2019   Lab Results  Component Value Date   WBC 7.0 03/09/2020   HGB 13.1 03/09/2020   HCT 40.2 03/09/2020   MCV 80.5 03/09/2020   PLT 218.0 03/09/2020   Lab Results  Component Value Date   NA 140 06/04/2020   K 3.8 06/04/2020   CO2 28 06/04/2020   GLUCOSE 107 (H) 06/04/2020   BUN 13 06/04/2020   CREATININE 0.49 06/04/2020   BILITOT 0.3 06/04/2020   ALKPHOS 54 06/04/2020   AST 16 06/04/2020   ALT 26 06/04/2020   PROT 6.8 06/04/2020   ALBUMIN 4.1 06/04/2020   CALCIUM 9.9 06/04/2020   ANIONGAP 12 12/26/2015   GFR 157.12 06/04/2020   Lab Results  Component Value Date   CHOL 126 06/04/2020   Lab Results  Component Value Date   HDL 55.80 06/04/2020   Lab Results  Component Value Date   LDLCALC 58 06/04/2020   Lab Results  Component Value Date   TRIG 61.0  06/04/2020   Lab Results  Component Value Date   CHOLHDL 2 06/04/2020   Lab Results  Component Value Date   HGBA1C 6.4 (A) 01/29/2021   Mammogram- Last completed 12/28/2018. Results normal. Repeat in 1 year.  Colonoscopy- Last completed on 2015. Results showed polyps. Repeat in 5 years.  Pap Smear- History of hysterectomy.  Dexa- Not yet completed.    Assessment & Plan:   Problem List Items Addressed This Visit      Unprioritized   Class 2 severe obesity with serious comorbidity and body mass index (BMI) of 38.0 to 38.9 in adult [facility])   DM (diabetes mellitus) type II uncontrolled, periph vascular disorder ([place])    Per endo      Relevant Medications   losartan (COZAAR) 100 MG tablet   Essential hypertension    Well controlled, no changes to meds. Encouraged heart healthy diet such as the DASH diet and exercise as tolerated.       Relevant Medications   losartan (COZAAR) 100 MG tablet   Other Relevant Orders   Comprehensive metabolic panel   Hyperlipidemia    Encouraged heart healthy diet, increase exercise, avoid trans fats, consider a krill oil cap daily       Relevant Medications   losartan (COZAAR) 100 MG tablet   Preventative health care - Primary    ghm utd Check labs  Colon to be done soon Mammogram to be done      Relevant Medications   losartan (  COZAAR) 100 MG tablet   Other Relevant Orders   Lipid panel   CBC with Differential/Platelet   Comprehensive metabolic panel   TSH    Other Visit Diagnoses    Uncontrolled type 2 diabetes mellitus with hyperglycemia (HCC)       Relevant Medications   losartan (COZAAR) 100 MG tablet   Other Relevant Orders   Lipid panel   Comprehensive metabolic panel   Hyperlipidemia associated with type 2 diabetes mellitus (HCC)       Relevant Medications   losartan (COZAAR) 100 MG tablet   Other Relevant Orders   Lipid panel   Comprehensive metabolic panel   Encounter for TB tine test       Relevant Orders    QuantiFERON-TB Gold Plus       Meds ordered this encounter  Medications  . losartan (COZAAR) 100 MG tablet    Sig: TAKE 1 TABLET(100 MG) BY MOUTH DAILY    Dispense:  90 tablet    Refill:  1    I, Dr. Roma Schanz, DO, personally preformed the services described in this documentation.  All medical record entries made by the scribe were at my direction and in my presence.  I have reviewed the chart and discharge instructions (if applicable) and agree that the record reflects my personal performance and is accurate and complete. 03/26/2021   I,Shehryar Baig,acting as a scribe for [name], DO.,have documented all relevant documentation on the behalf of [name], DO,as directed by  [name], DO while in the presence of [name], DO.   [name], DO

## 2021-03-27 LAB — COMPREHENSIVE METABOLIC PANEL
ALT: 16 U/L (ref 0–35)
AST: 12 U/L (ref 0–37)
Albumin: 4 g/dL (ref 3.5–5.2)
Alkaline Phosphatase: 69 U/L (ref 39–117)
BUN: 12 mg/dL (ref 6–23)
CO2: 27 mEq/L (ref 19–32)
Calcium: 9.7 mg/dL (ref 8.4–10.5)
Chloride: 103 mEq/L (ref 96–112)
Creatinine, Ser: 0.54 mg/dL (ref 0.40–1.20)
GFR: 101.42 mL/min (ref >60.00)
Glucose, Bld: 106 mg/dL — ABNORMAL HIGH (ref 70–99)
Potassium: 3.9 mEq/L (ref 3.5–5.1)
Sodium: 138 mEq/L (ref 135–145)
Total Bilirubin: 0.3 mg/dL (ref 0.2–1.2)
Total Protein: 6.1 g/dL (ref 6.0–8.3)

## 2021-03-27 LAB — CBC WITH DIFFERENTIAL/PLATELET
Basophils Absolute: 0.1 10*3/uL (ref 0.0–0.1)
Basophils Relative: 1.3 % (ref 0.0–3.0)
Eosinophils Absolute: 0.1 10*3/uL (ref 0.0–0.7)
Eosinophils Relative: 1.8 % (ref 0.0–5.0)
HCT: 38.1 % (ref 36.0–46.0)
Hemoglobin: 12.3 g/dL (ref 12.0–15.0)
Lymphocytes Relative: 43.5 % (ref 12.0–46.0)
Lymphs Abs: 1.9 10*3/uL (ref 0.7–4.0)
MCHC: 32.3 g/dL (ref 30.0–36.0)
MCV: 81.3 fl (ref 78.0–100.0)
Monocytes Absolute: 0.3 10*3/uL (ref 0.1–1.0)
Monocytes Relative: 6.9 % (ref 3.0–12.0)
Neutro Abs: 2 10*3/uL (ref 1.4–7.7)
Neutrophils Relative %: 46.5 % (ref 43.0–77.0)
Platelets: 207 10*3/uL (ref 150.0–400.0)
RBC: 4.68 Mil/uL (ref 3.87–5.11)
RDW: 14.8 % (ref 11.5–15.5)
WBC: 4.3 10*3/uL (ref 4.0–10.5)

## 2021-03-27 LAB — LIPID PANEL
Cholesterol: 183 mg/dL (ref 0–200)
HDL: 58.4 mg/dL (ref >39.00)
LDL Cholesterol: 113 mg/dL — ABNORMAL HIGH (ref 0–99)
NonHDL: 124.81
Total CHOL/HDL Ratio: 3
Triglycerides: 61 mg/dL (ref 0.0–149.0)
VLDL: 12.2 mg/dL (ref 0.0–40.0)

## 2021-03-27 LAB — TSH: TSH: 0.35 u[IU]/mL (ref 0.35–4.50)

## 2021-03-28 ENCOUNTER — Other Ambulatory Visit: Payer: Self-pay | Admitting: Family Medicine

## 2021-03-28 DIAGNOSIS — E785 Hyperlipidemia, unspecified: Secondary | ICD-10-CM

## 2021-03-28 LAB — QUANTIFERON-TB GOLD PLUS
Mitogen-NIL: >10 IU/mL
NIL: 0.02 IU/mL
QuantiFERON-TB Gold Plus: NEGATIVE
TB1-NIL: 0 IU/mL
TB2-NIL: 0 IU/mL

## 2021-03-29 ENCOUNTER — Encounter: Payer: 59 | Admitting: Gastroenterology

## 2021-04-03 ENCOUNTER — Other Ambulatory Visit: Payer: Self-pay

## 2021-04-03 ENCOUNTER — Ambulatory Visit
Admission: RE | Admit: 2021-04-03 | Discharge: 2021-04-03 | Disposition: A | Discharge status: Home / Self Care | Payer: 59 | Source: Ambulatory Visit | Attending: Obstetrics and Gynecology | Admitting: Obstetrics and Gynecology

## 2021-04-03 DIAGNOSIS — Z01419 Encounter for gynecological examination (general) (routine) without abnormal findings: Secondary | ICD-10-CM

## 2021-04-04 ENCOUNTER — Telehealth: Payer: Self-pay | Admitting: Internal Medicine

## 2021-04-04 DIAGNOSIS — N182 Chronic kidney disease, stage 2 (mild): Secondary | ICD-10-CM

## 2021-04-04 NOTE — Telephone Encounter (Signed)
MEDICATION: Ozempic  PHARMACY:  [facility] on [address]  HAS THE PATIENT CONTACTED THEIR PHARMACY? YES   IS THIS A 90 DAY SUPPLY :  YES   IS PATIENT OUT OF MEDICATION: YES, next injection Sunday 04/07/21  LAST APPOINTMENT DATE: @5 /24/2022  NEXT APPOINTMENT DATE:@8 /19/2022  DO WE HAVE YOUR PERMISSION TO LEAVE A DETAILED MESSAGE?: yes

## 2021-04-05 MED ORDER — OZEMPIC (0.25 OR 0.5 MG/DOSE) 2 MG/1.5ML ~~LOC~~ SOPN: 0.5000 mg | PEN_INJECTOR | SUBCUTANEOUS | 1 refills | Status: DC

## 2021-04-05 NOTE — Telephone Encounter (Signed)
Rx sent to preferred pharmacy.

## 2021-04-12 ENCOUNTER — Encounter: Payer: Self-pay | Admitting: Family Medicine

## 2021-04-12 ENCOUNTER — Ambulatory Visit: Payer: 59 | Admitting: Family Medicine

## 2021-04-12 ENCOUNTER — Other Ambulatory Visit: Payer: Self-pay

## 2021-04-12 VITALS — BP 110/60 | HR 101 | Temp 98.1°F | Resp 18 | Ht 64.0 in | Wt 208.2 lb

## 2021-04-12 DIAGNOSIS — L609 Nail disorder, unspecified: Secondary | ICD-10-CM | POA: Diagnosis not present

## 2021-04-12 NOTE — Progress Notes (Signed)
Patient ID: Angela Barr, female    DOB: September 24, 1961  Age: 60 y.o. MRN: [id number]    Subjective:  Subjective  HPI Angela Barr presents for an office visit today. She complains of black streak under the L great toe x3-4 moths. She notes that she doesn't remember hurting or dropping it. She reports trying to clean out the area and scratches it.  She states that the area worsen over time and it had returned. She denies any chest pain, SOB, fever, abdominal pain, cough, chills, sore throat, dysuria, urinary incontinence, back pain, HA, or N/V/D at this time.   Review of Systems  Constitutional:  Negative for chills, fatigue and fever.  HENT:  Negative for ear pain, rhinorrhea, sinus pressure, sinus pain, sore throat and tinnitus.   Eyes:  Negative for pain.  Respiratory:  Negative for cough, shortness of breath and wheezing.   Cardiovascular:  Negative for chest pain.  Gastrointestinal:  Negative for abdominal pain, anal bleeding, constipation, diarrhea, nausea and vomiting.  Genitourinary:  Negative for flank pain.  Musculoskeletal:  Negative for back pain and neck pain.  Skin:  Negative for rash.       (+) black streak under L great toe   Neurological:  Negative for seizures, weakness, light-headedness, numbness and headaches.   History Past Medical History:  Diagnosis Date   Arthritis    bilateral hips   Colon polyps 2008   [place]--Dr [place]wood   Diabetes mellitus 2006   on meds   Hyperlipemia 2012   on meds   Hypertension 2012   on meds   Obesity    Palpitations     She has a past surgical history that includes Vaginal hysterectomy (2006); Breast biopsy (Right, 2003); Tubal ligation (1990); Polypectomy; Wisdom tooth extraction; and Colonoscopy (07/07/2011).   Her family history includes Breast cancer in her maternal grandmother; Colon cancer (age of onset: 66) in her father; Colon polyps (age of onset: 41) in her father; Diabetes in her father and paternal  grandmother; Heart disease in her father; Heart failure in her father; High Cholesterol in her mother; Hypertension in her father and mother; Obesity in her father; Stroke in her mother; Sudden death in her father.She reports that she has never smoked. She has never used smokeless tobacco. She reports that she does not drink alcohol and does not use drugs.  Current Outpatient Medications on File Prior to Visit  Medication Sig Dispense Refill   losartan (COZAAR) 100 MG tablet TAKE 1 TABLET(100 MG) BY MOUTH DAILY 90 tablet 1   metFORMIN (GLUCOPHAGE-XR) 500 MG 24 hr tablet Take 3 tablets (1,500 mg total) by mouth daily with breakfast. 270 tablet 2   Multiple Vitamin (MULTIVITAMIN ADULT PO) Take by mouth.     ONETOUCH VERIO test strip CHECK YOUR BLOOD SUGAR TWICE DAILY 100 strip 11   rosuvastatin (CRESTOR) 20 MG tablet Take 1 tablet (20 mg total) by mouth at bedtime. 90 tablet 1   Semaglutide,0.25 or 0.5MG /DOS, (OZEMPIC, 0.25 OR 0.5 MG/DOSE,) 2 MG/1.5ML SOPN Inject 0.5 mg into the skin once a week. 4.5 mL 1   VITAMIN D PO Take 1 tablet by mouth daily at 6 (six) AM.     No current facility-administered medications on file prior to visit.     Objective:  Objective  Physical Exam Vitals and nursing note reviewed.  Constitutional:      General: She is not in acute distress.    Appearance: Normal appearance. She is well-developed. She is not ill-appearing.  HENT:     Head: Normocephalic and atraumatic.     Right Ear: External ear normal.     Left Ear: External ear normal.     Nose: Nose normal.  Eyes:     General:        Right eye: No discharge.        Left eye: No discharge.     Extraocular Movements: Extraocular movements intact.     Pupils: Pupils are equal, round, and reactive to light.  Cardiovascular:     Rate and Rhythm: Normal rate and regular rhythm.     Pulses: Normal pulses.     Heart sounds: Normal heart sounds. No murmur heard.   No friction rub. No gallop.  Pulmonary:      Effort: Pulmonary effort is normal. No respiratory distress.     Breath sounds: Normal breath sounds. No stridor. No wheezing, rhonchi or rales.  Chest:     Chest wall: No tenderness.  Abdominal:     General: Bowel sounds are normal. There is no distension.     Palpations: Abdomen is soft. There is no mass.     Tenderness: There is no abdominal tenderness. There is no guarding or rebound.     Hernia: No hernia is present.  Musculoskeletal:        General: Normal range of motion.     Cervical back: Normal range of motion and neck supple.     Right lower leg: No edema.     Left lower leg: No edema.  Feet:     Left foot:     Skin integrity: No erythema.     Comments: There is long black streak present on the L hallux, however there is no erythema or tenderness present.  Skin:    General: Skin is warm and dry.     Comments: L big toenail---  + black streak down middle Non tender  No errythema   Neurological:     Mental Status: She is alert and oriented to person, place, and time.  Psychiatric:        Behavior: Behavior normal.        Thought Content: Thought content normal.   BP 110/60 (BP Location: Left Arm, Patient Position: Sitting, Cuff Size: Large)   Pulse (!) 101   Temp 98.1 F (36.7 C) (Oral)   Resp 18   Ht 5\' 4"  (1.626 m)   Wt 208 lb 3.2 oz (94.4 kg)   SpO2 98%   BMI 35.74 kg/m  Wt Readings from Last 3 Encounters:  04/12/21 208 lb 3.2 oz (94.4 kg)  03/26/21 209 lb 9.6 oz (95.1 kg)  03/15/21 199 lb (90.3 kg)     Lab Results  Component Value Date   WBC 4.3 03/26/2021   HGB 12.3 03/26/2021   HCT 38.1 03/26/2021   PLT 207.0 03/26/2021   GLUCOSE 106 (H) 03/26/2021   CHOL 183 03/26/2021   TRIG 61.0 03/26/2021   HDL 58.40 03/26/2021   LDLCALC 113 (H) 03/26/2021   ALT 16 03/26/2021   AST 12 03/26/2021   NA 138 03/26/2021   K 3.9 03/26/2021   CL 103 03/26/2021   CREATININE 0.54 03/26/2021   BUN 12 03/26/2021   CO2 27 03/26/2021   TSH 0.35 03/26/2021    HGBA1C 6.4 (A) 01/29/2021   MICROALBUR 0.8 05/14/2018    MM 3D SCREEN BREAST BILATERAL  Result Date: 04/04/2021 CLINICAL DATA:  Screening. EXAM: DIGITAL SCREENING BILATERAL MAMMOGRAM WITH TOMOSYNTHESIS AND CAD TECHNIQUE:  Bilateral screening digital craniocaudal and mediolateral oblique mammograms were obtained. Bilateral screening digital breast tomosynthesis was performed. The images were evaluated with computer-aided detection. COMPARISON:  Previous exam(s). ACR Breast Density Category b: There are scattered areas of fibroglandular density. FINDINGS: There are no findings suspicious for malignancy. The images were evaluated with computer-aided detection. IMPRESSION: No mammographic evidence of malignancy. A result letter of this screening mammogram will be mailed directly to the patient. RECOMMENDATION: Screening mammogram in one year. (Code:SM-B-01Y) BI-RADS CATEGORY  1: Negative. Electronically Signed   By: Lillia Mountain M.D.   On: 04/04/2021 14:40     Assessment & Plan:  Plan   No orders of the defined types were placed in this encounter.   Problem List Items Addressed This Visit   None Visit Diagnoses     Nail abnormalities    -  Primary   Relevant Orders   Ambulatory referral to Dermatology       Follow-up: No follow-ups on file.   I,Gordon Zheng,acting as a Education administrator for [employer], DO.,have documented all relevant documentation on the behalf of Angela Held, DO,as directed by  Angela Held, DO while in the presence of [place], DO, have reviewed all documentation for this visit. The documentation on 04/12/21 for the exam, diagnosis, procedures, and orders are all accurate and complete.

## 2021-04-12 NOTE — Patient Instructions (Signed)
Subungual Hematoma A subungual hematoma, sometimes called runner's toe or tennis toe, is a collection of blood under a fingernail or toenail. It can cause pain and a darkblue area under the nail. What are the causes? This condition is caused by an injury to a finger or toe that breaks a blood vessel beneath the nail. It can develop from: A hard, direct hit to a finger or toe (crush injury). Pressure being repeatedly put on a finger or toe, such as pressure on a toe from running or playing tennis. What are the signs or symptoms? Symptoms of this condition include: A blue or dark blue color under the nail. Pain or throbbing in the injured area. How is this diagnosed? This condition is diagnosed with a medical history and a physical exam. X-rays may be done to check for damage to the surrounding bones and tissues. How is this treated? Usually, treatment is not needed for this condition. The pain often goes awayin a few days, and the dark color under the nail will go away as the nail grows. If the injury is more severe, your health care provider may: Perform a painless procedure to drain the blood from beneath the nail. This may be done if the condition is causing a lot of pain or if a lot of blood collects under the fingernail or toenail. Remove the nail. This may be needed if there is a cut under the nail that requires stitches (sutures). Follow these instructions at home: Managing pain, stiffness, and swelling  If directed, apply ice to the injured area: Put ice in a plastic bag. Place a towel between your skin and the bag. Leave the ice on for 20 minutes, 2-3 times a day. Raise (elevate) the injured finger or toe above the level of your heart while you are sitting or lying down. This will help to decrease pain and swelling.  Injury care Follow instructions from your health care provider about how to take care of your injury. Make sure you: Change any bandage (dressing) as told by your  health care provider. Wash your hands with soap and water before you change your dressing. If soap and water are not available, use hand sanitizer. Leave stitches (sutures) in place. You may have these if your health care provider repaired a cut under the nail. The sutures may need to stay in place for 2 weeks or longer. If part of your nail falls off, gently trim the remaining nail. This prevents the remaining nail from catching on something and causing further injury. General instructions Take over-the-counter and prescription medicines only as told by your health care provider. Return to your normal activities as told by your health care provider. Ask your health care provider what activities are safe for you. Keep all follow-up visits as told by your health care provider. This is important. Contact a health care provider if you have: Pain that is not controlled with medicine. A fever. Redness, swelling, or pain around your nail. Get help right away if you have: Fluid, blood, or pus coming from your nail. Summary A subungual hematoma is a collection of blood under a fingernail or toenail. This condition is typically caused by a crush injury or an injury from repeatedly putting stress on a finger or toe. The condition causes pain and a dark blue area under the nail. A subungual hematoma will usually go away on its own. In some cases, a health care provider may drain the blood from underneath the nail. This  information is not intended to replace advice given to you by your health care provider. Make sure you discuss any questions you have with your healthcare provider. Document Revised: 03/17/2018 Document Reviewed: 03/18/2018 Elsevier Patient Education  2022 [employer].

## 2021-05-28 ENCOUNTER — Telehealth: Payer: Self-pay

## 2021-05-28 ENCOUNTER — Other Ambulatory Visit: Payer: Self-pay

## 2021-05-28 ENCOUNTER — Telehealth (INDEPENDENT_AMBULATORY_CARE_PROVIDER_SITE_OTHER): Payer: 59 | Admitting: Family Medicine

## 2021-05-28 ENCOUNTER — Encounter: Payer: Self-pay | Admitting: Family Medicine

## 2021-05-28 DIAGNOSIS — U071 COVID-19: Secondary | ICD-10-CM

## 2021-05-28 MED ORDER — FLUTICASONE PROPIONATE 50 MCG/ACT NA SUSP: 2.0000 | Freq: Every day | NASAL | 6 refills | Status: DC

## 2021-05-28 MED ORDER — MOLNUPIRAVIR EUA 200MG CAPSULE: 4.0000 | ORAL_CAPSULE | Freq: Two times a day (BID) | ORAL | 0 refills | Status: AC

## 2021-05-28 NOTE — Telephone Encounter (Signed)
Tested Positive yesterday 05/27/21 Sxs include cough, fatigue, congestion- no fever Sxs started Sunday Pt has been scheduled   Client [facility] Night - Client Client Site [facility] - Night Physician Etter Sjogren - MD Contact Type Call Who Is Calling Patient / Member / Family / Caregiver Caller Name [place] Phone Number [phone] Patient Name Angela Barr Patient DOB [birth year] Call Type Message Only Information Provided Reason for Call Request to Schedule Office Appointment Initial Comment Caller states she (and her husband) tested positive for covid and would like a virtual appointment Patient request to speak to RN No Additional Comment Provided office hours Disp. Time Disposition Final User 05/27/2021 7:18:53 PM General Information Provided Yes Vinnie Level Call Closed By: Vinnie Level Transaction Date/Time: 05/27/2021 7:16:58 PM (ET)

## 2021-05-28 NOTE — Assessment & Plan Note (Signed)
Antiviral sent to pharmacy flonase / mucinex prn rto prn  Quarantine 5 days and then if symptoms improve she can leave with a mask on for 5 more days

## 2021-05-28 NOTE — Progress Notes (Signed)
MyChart Video Visit    Virtual Visit via Video Note   This visit type was conducted due to national recommendations for restrictions regarding the COVID-19 Pandemic (e.g. social distancing) in an effort to limit this patient's exposure and mitigate transmission in our community. This patient is at least at moderate risk for complications without adequate follow up. This format is felt to be most appropriate for this patient at this time. Physical exam was limited by quality of the video and audio technology used for the visit. Angela Barr was able to get the patient set up on a video visit.  Patient location: Home Patient and provider in visit Provider location: Office  I discussed the limitations of evaluation and management by telemedicine and the availability of in person appointments. The patient expressed understanding and agreed to proceed.  Visit Date: 05/28/2021  Today's healthcare provider: [name], DO     Subjective:    Patient ID: Angela Barr, female    DOB: 1961-09-21, 60 y.o.   MRN: [id number]  Chief Complaint  Patient presents with   Covid Positive    Tested Positive yesterday 05/27/21 Sxs include cough, fatigue, congestion- no fever Sxs started Sunday    HPI Patient is in today for a video visit for + covid--- she tested positive yesterday.  Symptoms started yesterday too.  Pt c/o cough, fatigue, and congestion.  She is taking mucinex sinus and alka seltzer cold ----no wheezing and no fever    Past Medical History:  Diagnosis Date   Arthritis    bilateral hips   Colon polyps 2008   [place]--Dr Norwood   Diabetes mellitus 2006   on meds   Hyperlipemia 2012   on meds   Hypertension 2012   on meds   Obesity    Palpitations     Past Surgical History:  Procedure Laterality Date   BREAST BIOPSY Right 2003   COLONOSCOPY  07/07/2011   RK-F/V-suprep(exc)-TA x 1   POLYPECTOMY     TUBAL LIGATION  1990   VAGINAL HYSTERECTOMY   2006   WISDOM TOOTH EXTRACTION      Family History  Problem Relation Age of Onset   Heart failure Father    Hypertension Father    Colon cancer Father 55   Diabetes Father    Heart disease Father    Sudden death Father    Obesity Father    Colon polyps Father 28   Hypertension Mother    High Cholesterol Mother    Stroke Mother    Breast cancer Maternal Grandmother    Diabetes Paternal Grandmother    Esophageal cancer Neg Hx    Rectal cancer Neg Hx    Stomach cancer Neg Hx     Social History   Socioeconomic History   Marital status: Married    Spouse name: John   Number of children: 3   Years of education: Not on file   Highest education level: Not on file  Occupational History   Occupation: Therapist, sports  Tobacco Use   Smoking status: Never   Smokeless tobacco: Never  Vaping Use   Vaping Use: Never used  Substance and Sexual Activity   Alcohol use: No   Drug use: No   Sexual activity: Yes    Partners: Male    Birth control/protection: Surgical  Other Topics Concern   Not on file  Social History Narrative   3 caffeine drinks daily.   Exercise-no   Lives at home with  husband    Social Determinants of Radio broadcast assistant Strain: Not on file  Food Insecurity: Not on file  Transportation Needs: Not on file  Physical Activity: Not on file  Stress: Not on file  Social Connections: Not on file  Intimate Partner Violence: Not on file    Outpatient Medications Prior to Visit  Medication Sig Dispense Refill   losartan (COZAAR) 100 MG tablet TAKE 1 TABLET(100 MG) BY MOUTH DAILY 90 tablet 1   metFORMIN (GLUCOPHAGE-XR) 500 MG 24 hr tablet Take 3 tablets (1,500 mg total) by mouth daily with breakfast. 270 tablet 2   Multiple Vitamin (MULTIVITAMIN ADULT PO) Take by mouth.     ONETOUCH VERIO test strip CHECK YOUR BLOOD SUGAR TWICE DAILY 100 strip 11   rosuvastatin (CRESTOR) 20 MG tablet Take 1 tablet (20 mg total) by mouth at bedtime. 90 tablet 1   Semaglutide,0.25  or 0.'5MG'$ /DOS, (OZEMPIC, 0.25 OR 0.5 MG/DOSE,) 2 MG/1.5ML SOPN Inject 0.5 mg into the skin once a week. 4.5 mL 1   VITAMIN D PO Take 1 tablet by mouth daily at 6 (six) AM.     No facility-administered medications prior to visit.    Allergies  Allergen Reactions   Bactrim Nausea Only   Sulfa Antibiotics Nausea And Vomiting   Sulfamethoxazole-Trimethoprim     Review of Systems  Constitutional:  Positive for malaise/fatigue. Negative for fever.  HENT:  Positive for congestion.   Eyes:  Negative for blurred vision.  Respiratory:  Positive for cough and sputum production. Negative for shortness of breath and wheezing.   Cardiovascular:  Negative for chest pain, palpitations and leg swelling.  Gastrointestinal:  Negative for abdominal pain, blood in stool and nausea.  Genitourinary:  Negative for dysuria and frequency.  Musculoskeletal:  Negative for falls.  Skin:  Negative for rash.  Neurological:  Negative for dizziness, loss of consciousness and headaches.  Endo/Heme/Allergies:  Negative for environmental allergies.  Psychiatric/Behavioral:  Negative for depression. The patient is not nervous/anxious.       Objective:    Physical Exam Vitals and nursing note reviewed.  Constitutional:      Appearance: Normal appearance. She is well-developed.  Neck:     Vascular: No carotid bruit or JVD.  Pulmonary:     Effort: Pulmonary effort is normal.  Neurological:     Mental Status: She is alert and oriented to person, place, and time.  Psychiatric:        Mood and Affect: Mood normal.        Behavior: Behavior normal.        Thought Content: Thought content normal.        Judgment: Judgment normal.    There were no vitals taken for this visit. Wt Readings from Last 3 Encounters:  04/12/21 208 lb 3.2 oz (94.4 kg)  03/26/21 209 lb 9.6 oz (95.1 kg)  03/15/21 199 lb (90.3 kg)    Diabetic Foot Exam - Simple   No data filed    Lab Results  Component Value Date   WBC 4.3  03/26/2021   HGB 12.3 03/26/2021   HCT 38.1 03/26/2021   PLT 207.0 03/26/2021   GLUCOSE 106 (H) 03/26/2021   CHOL 183 03/26/2021   TRIG 61.0 03/26/2021   HDL 58.40 03/26/2021   LDLCALC 113 (H) 03/26/2021   ALT 16 03/26/2021   AST 12 03/26/2021   NA 138 03/26/2021   K 3.9 03/26/2021   CL 103 03/26/2021   CREATININE 0.54 03/26/2021  BUN 12 03/26/2021   CO2 27 03/26/2021   TSH 0.35 03/26/2021   HGBA1C 6.4 (A) 01/29/2021   MICROALBUR 0.8 05/14/2018    Lab Results  Component Value Date   TSH 0.35 03/26/2021   Lab Results  Component Value Date   WBC 4.3 03/26/2021   HGB 12.3 03/26/2021   HCT 38.1 03/26/2021   MCV 81.3 03/26/2021   PLT 207.0 03/26/2021   Lab Results  Component Value Date   NA 138 03/26/2021   K 3.9 03/26/2021   CO2 27 03/26/2021   GLUCOSE 106 (H) 03/26/2021   BUN 12 03/26/2021   CREATININE 0.54 03/26/2021   BILITOT 0.3 03/26/2021   ALKPHOS 69 03/26/2021   AST 12 03/26/2021   ALT 16 03/26/2021   PROT 6.1 03/26/2021   ALBUMIN 4.0 03/26/2021   CALCIUM 9.7 03/26/2021   ANIONGAP 12 12/26/2015   GFR 101.42 03/26/2021   Lab Results  Component Value Date   CHOL 183 03/26/2021   Lab Results  Component Value Date   HDL 58.40 03/26/2021   Lab Results  Component Value Date   LDLCALC 113 (H) 03/26/2021   Lab Results  Component Value Date   TRIG 61.0 03/26/2021   Lab Results  Component Value Date   CHOLHDL 3 03/26/2021   Lab Results  Component Value Date   HGBA1C 6.4 (A) 01/29/2021       Assessment & Plan:   Problem List Items Addressed This Visit       Unprioritized   COVID-19 - Primary    Antiviral sent to pharmacy flonase / mucinex prn rto prn  Quarantine 5 days and then if symptoms improve she can leave with a mask on for 5 more days       Relevant Medications   molnupiravir EUA 200 mg CAPS   fluticasone (FLONASE) 50 MCG/ACT nasal spray     Meds ordered this encounter  Medications   molnupiravir EUA 200 mg CAPS     Sig: Take 4 capsules (800 mg total) by mouth 2 (two) times daily for 5 days.    Dispense:  40 capsule    Refill:  0   fluticasone (FLONASE) 50 MCG/ACT nasal spray    Sig: Place 2 sprays into both nostrils daily.    Dispense:  16 g    Refill:  6    I discussed the assessment and treatment plan with the patient. The patient was provided an opportunity to ask questions and all were answered. The patient agreed with the plan and demonstrated an understanding of the instructions.   The patient was advised to call back or seek an in-person evaluation if the symptoms worsen or if the condition fails to improve as anticipated.  I provided 20 minutes of face-to-face time during this encounter.   [name], DO [place] [facility] [phone] (phone) [phone] (fax)  [place]

## 2021-06-09 ENCOUNTER — Other Ambulatory Visit: Payer: Self-pay | Admitting: Family Medicine

## 2021-06-09 DIAGNOSIS — E1169 Type 2 diabetes mellitus with other specified complication: Secondary | ICD-10-CM

## 2021-06-09 DIAGNOSIS — E785 Hyperlipidemia, unspecified: Secondary | ICD-10-CM

## 2021-06-14 ENCOUNTER — Telehealth (INDEPENDENT_AMBULATORY_CARE_PROVIDER_SITE_OTHER): Payer: 59 | Admitting: Internal Medicine

## 2021-06-14 ENCOUNTER — Other Ambulatory Visit: Payer: Self-pay

## 2021-06-14 ENCOUNTER — Encounter: Payer: Self-pay | Admitting: Internal Medicine

## 2021-06-14 VITALS — Ht 64.5 in | Wt 208.0 lb

## 2021-06-14 DIAGNOSIS — E785 Hyperlipidemia, unspecified: Secondary | ICD-10-CM | POA: Diagnosis not present

## 2021-06-14 DIAGNOSIS — E0822 Diabetes mellitus due to underlying condition with diabetic chronic kidney disease: Secondary | ICD-10-CM

## 2021-06-14 DIAGNOSIS — E1165 Type 2 diabetes mellitus with hyperglycemia: Secondary | ICD-10-CM | POA: Diagnosis not present

## 2021-06-14 DIAGNOSIS — N182 Chronic kidney disease, stage 2 (mild): Secondary | ICD-10-CM | POA: Diagnosis not present

## 2021-06-14 NOTE — Patient Instructions (Signed)
Please continue: - Metformin XR 500 mg in am and 1000 mg with dinner - Ozempic 0.5 mg weekly  Please return in 4 months with your sugar log.

## 2021-06-14 NOTE — Progress Notes (Signed)
Patient ID: Angela Barr, female   DOB: [birth year], 60 y.o.   MRN: [id number]  Patient location: Home My location: Office Persons participating in the virtual visit: patient, provider  Referring Provider: [name], DO  I connected with the patient on 06/14/21 at  8:40 AM EDT by a video enabled telemedicine application and verified that I am speaking with the correct person.   I discussed the limitations of evaluation and management by telemedicine and the availability of in person appointments. The patient expressed understanding and agreed to proceed.   Details of the encounter are shown below.  HPI: Angela Barr is a 60 y.o.-year-old female, returning for f/u for DM2, dx in 2007, non-insulin-dependent, uncontrolled, without long term complications, but with hyperglycemia. Last visit 4 months ago.  Interim history: No increased urination, blurry vision, nausea, chest pain. She was diagnosed with COVID-19 05/27/2021. She had cough, sinus congestion, HAs, diarrhea. She did not get Paxlovid, but Molnupiravir. Now sxs resolved except for scratchy throat. Sugars remained controlled. Has low appetite.  Reviewed HbA1c levels: Lab Results  Component Value Date   HGBA1C 6.4 (A) 01/29/2021   HGBA1C 7.0 (A) 09/25/2020   HGBA1C 6.1 (A) 05/15/2020   HGBA1C 7.1 (A) 01/10/2020   HGBA1C 6.4 (A) 09/12/2019   HGBA1C 6.7 (A) 05/12/2019   HGBA1C 7.2 (A) 01/19/2019   HGBA1C 6.6 (A) 09/17/2018   HGBA1C 6.4 (A) 05/14/2018   HGBA1C 7.3 02/08/2018   HGBA1C 6.6 11/11/2017   HGBA1C 6.6 08/11/2017   HGBA1C 6.6 04/01/2017   HGBA1C 6.3 11/14/2016   HGBA1C 6.6 08/15/2016   HGBA1C 6.4 05/15/2016   HGBA1C 7.5 (H) 02/05/2016   HGBA1C 7.1 09/25/2015   HGBA1C 7.7 06/25/2015   HGBA1C 7.6 (H) 03/23/2015  09/25/2015: HbA1c 7.1%  She is now on: - Metformin XR 500 mg with lunch and 1000 mg with dinner >> 1500 mg with dinner >> 500 mg in am and 1000 mg with dinner - Ozempic 0.5 mg weekly >>  restarted 08/2020 Stop glimepiride 2 mg before dinner 08/2020 Stopped Jardiance due to price. Invokana caused yeast infections. Stopped Trulicity 1.5 mg >> stopped b/c Nausea and HAs, and also expensive - now tolerating it well, with mild nausea. She was Bydureon >> lumps at inj. Site.  She was on Victoza, now too expensive.  She is checking sugars 0-1x a day: - am:  139-166, 183 >> 119, 149, 166 >> 118-140 - 2h after b'fast: 140, 151, 185 >> 191 >> n/c >> 189 >> n/c - before lunch: 120s, 190 >> n/c >> 127-148 >> 102 >> 120-130 - 2h after lunch: 144-220, 270 >> n/c >> 101-116 >> n/c - before dinner:  116 >> n/c >> 85, 98 >> n/c - 2h after dinner:  178 >> n/c >> 89 >> n/c - bedtime:  n/c >> 156 >> 102 >> 149 >> n/c - nighttime: n/c >> 70s occas. >> 129 >> n/c Lowest sugar was 127 >> 119 >> 118; she has hypoglycemia awareness in the 90s. Highest sugar was 189 >> 140s.  Glucometer: One Touch Verio Flex   Eats sunflower seeds and nuts.  She stopped sodas but changed to sweet tea in the past.  I advised her to also stop this.  No CKD; Last BUN/creatinine:  Lab Results  Component Value Date   BUN 12 03/26/2021   CREATININE 0.54 03/26/2021   Normal ACR levels: Lab Results  Component Value Date   MICRALBCREAT 0.6 05/14/2018   MICRALBCREAT 0.7 02/05/2016  MICRALBCREAT 0.7 12/25/2014   MICRALBCREAT 2.0 06/13/2014   MICRALBCREAT 0.7 04/04/2013   MICRALBCREAT 0.5 01/30/2012   MICRALBCREAT 0.3 06/17/2011  We stopped lisinopril as she had low blood pressure and dizziness in the past. On losartan.  + HL; last set of lipids: Lab Results  Component Value Date   CHOL 183 03/26/2021   HDL 58.40 03/26/2021   LDLCALC 113 (H) 03/26/2021   TRIG 61.0 03/26/2021   CHOLHDL 3 03/26/2021  On Crestor 20 - inconsistent dosing.  - last eye exam was in Summer 2021: No DR reportedly  -She denies numbness and tingling in her feet.   ROS: + see HPI  Past Medical History:  Diagnosis Date    Arthritis    bilateral hips   Colon polyps 2008   [place]--Dr Norwood   Diabetes mellitus 2006   on meds   Hyperlipemia 2012   on meds   Hypertension 2012   on meds   Obesity    Palpitations    Past Surgical History:  Procedure Laterality Date   BREAST BIOPSY Right 2003   COLONOSCOPY  07/07/2011   RK-F/V-suprep(exc)-TA x 1   POLYPECTOMY     TUBAL LIGATION  1990   VAGINAL HYSTERECTOMY  2006   WISDOM TOOTH EXTRACTION     History   Social History   Marital Status: Married    Spouse Name: N/A   Number of Children: 3   Occupational History   RN    Social History Main Topics   Smoking status: Never Smoker    Smokeless tobacco: Never Used   Alcohol Use: No   Drug Use: No   Social History Narrative   3 caffeine drinks daily    Current Outpatient Medications on File Prior to Visit  Medication Sig Dispense Refill   rosuvastatin (CRESTOR) 20 MG tablet TAKE 1 TABLET AT BEDTIME 30 tablet 0   fluticasone (FLONASE) 50 MCG/ACT nasal spray Place 2 sprays into both nostrils daily. 16 g 6   losartan (COZAAR) 100 MG tablet TAKE 1 TABLET(100 MG) BY MOUTH DAILY 90 tablet 1   metFORMIN (GLUCOPHAGE-XR) 500 MG 24 hr tablet Take 3 tablets (1,500 mg total) by mouth daily with breakfast. 270 tablet 2   Multiple Vitamin (MULTIVITAMIN ADULT PO) Take by mouth.     ONETOUCH VERIO test strip CHECK YOUR BLOOD SUGAR TWICE DAILY 100 strip 11   Semaglutide,0.25 or 0.'5MG'$ /DOS, (OZEMPIC, 0.25 OR 0.5 MG/DOSE,) 2 MG/1.5ML SOPN Inject 0.5 mg into the skin once a week. 4.5 mL 1   VITAMIN D PO Take 1 tablet by mouth daily at 6 (six) AM.     No current facility-administered medications on file prior to visit.   Allergies  Allergen Reactions   Bactrim Nausea Only   Sulfa Antibiotics Nausea And Vomiting   Sulfamethoxazole-Trimethoprim    Family History  Problem Relation Age of Onset   Heart failure Father    Hypertension Father    Colon cancer Father 59   Diabetes Father    Heart disease Father     Sudden death Father    Obesity Father    Colon polyps Father 47   Hypertension Mother    High Cholesterol Mother    Stroke Mother    Breast cancer Maternal Grandmother    Diabetes Paternal Grandmother    Esophageal cancer Neg Hx    Rectal cancer Neg Hx    Stomach cancer Neg Hx    PE: There were no vitals taken for this visit.  There is no height or weight on file to calculate BMI. Wt Readings from Last 3 Encounters:  04/12/21 208 lb 3.2 oz (94.4 kg)  03/26/21 209 lb 9.6 oz (95.1 kg)  03/15/21 199 lb (90.3 kg)   Constitutional:  in NAD  The physical exam was not performed (virtual visit).  ASSESSMENT: 1. DM2, non-insulin-dependent, now better controlled, without long term complications, but with hyperglycemia  2. Obesity class 2 BMI Classification: < 18.5 underweight  18.5-24.9 normal weight  25.0-29.9 overweight  30.0-34.9 class I obesity  35.0-39.9 class II obesity  ? 40.0 class III obesity   3. HL  PLAN:  1. Patient with longstanding, uncontrolled, type 2 diabetes, with improved control previously on [place], now off.  She continues on metformin and weekly GLP-1 receptor agonist.  She previously had to come off this due to price but she was able to restart it before last visit.  At that time, HbA1c was lower, at 6.4%.  She was interested in reducing the metformin dose at that time but as sugars were higher in the morning, we discussed about continuing the same dose of her, if she absolutely wanted to, to stop the morning dose of metformin. -She currently has COVID-19 infection, but sugars have not increased during this period.  Her highest blood sugar has been in the 140 as she mentions that she does not have much appetite. -For now, we can continue the current regimen.  She will need refills of her prescriptions at next visit. - I recommended:  Patient Instructions  Please continue: - Metformin XR 500 mg in am and 1000 mg with dinner - Ozempic 0.5 mg  weekly  Please return in 4 months with your sugar log.   - we we will recheck her HbA1c in September, when she has an appointment with PCP for labs - advised to check sugars at different times of the day - 1x a day, rotating check times - advised for yearly eye exams >> she is UTD - return to clinic in 4 months  2. Obesity class 2 -Continue Ozempic which should also help with weight loss -She was previously on the [employer] -Of note, she was seen in the [facility] in the past but she did not feel that this was a good fit for her -At last visit we discussed about cutting out sweet tea -She lost 3 pounds before last visit, and weight is stable at this time per her report: 208 lbs  3. HL -Reviewed latest lipid panel from 02/2021: LDL higher than before, now above target, the rest of fractions at goal: Lab Results  Component Value Date   CHOL 183 03/26/2021   HDL 58.40 03/26/2021   LDLCALC 113 (H) 03/26/2021   TRIG 61.0 03/26/2021   CHOLHDL 3 03/26/2021  -We will continue Crestor 20 mg daily.  She has no side effects from this.  At last visit, she was missing doses and I advised her to try to take it every night.  Philemon Kingdom, MD PhD [facility]

## 2021-06-28 ENCOUNTER — Other Ambulatory Visit (INDEPENDENT_AMBULATORY_CARE_PROVIDER_SITE_OTHER): Payer: 59

## 2021-06-28 ENCOUNTER — Other Ambulatory Visit: Payer: Self-pay

## 2021-06-28 DIAGNOSIS — E785 Hyperlipidemia, unspecified: Secondary | ICD-10-CM | POA: Diagnosis not present

## 2021-06-28 LAB — LIPID PANEL
Cholesterol: 122 mg/dL (ref 0–200)
HDL: 54.8 mg/dL (ref >39.00)
LDL Cholesterol: 58 mg/dL (ref 0–99)
NonHDL: 67
Total CHOL/HDL Ratio: 2
Triglycerides: 46 mg/dL (ref 0.0–149.0)
VLDL: 9.2 mg/dL (ref 0.0–40.0)

## 2021-06-28 LAB — COMPREHENSIVE METABOLIC PANEL
ALT: 17 U/L (ref 0–35)
AST: 15 U/L (ref 0–37)
Albumin: 4.1 g/dL (ref 3.5–5.2)
Alkaline Phosphatase: 70 U/L (ref 39–117)
BUN: 11 mg/dL (ref 6–23)
CO2: 28 mEq/L (ref 19–32)
Calcium: 9.3 mg/dL (ref 8.4–10.5)
Chloride: 104 mEq/L (ref 96–112)
Creatinine, Ser: 0.5 mg/dL (ref 0.40–1.20)
GFR: 103.14 mL/min (ref >60.00)
Glucose, Bld: 120 mg/dL — ABNORMAL HIGH (ref 70–99)
Potassium: 4 mEq/L (ref 3.5–5.1)
Sodium: 139 mEq/L (ref 135–145)
Total Bilirubin: 0.3 mg/dL (ref 0.2–1.2)
Total Protein: 6.5 g/dL (ref 6.0–8.3)

## 2021-07-09 ENCOUNTER — Other Ambulatory Visit: Payer: Self-pay | Admitting: Family Medicine

## 2021-07-09 DIAGNOSIS — E1169 Type 2 diabetes mellitus with other specified complication: Secondary | ICD-10-CM

## 2021-07-24 ENCOUNTER — Other Ambulatory Visit: Payer: Self-pay | Admitting: Family Medicine

## 2021-07-24 DIAGNOSIS — E1169 Type 2 diabetes mellitus with other specified complication: Secondary | ICD-10-CM

## 2021-08-05 ENCOUNTER — Other Ambulatory Visit: Payer: Self-pay | Admitting: Internal Medicine

## 2021-08-05 DIAGNOSIS — E0822 Diabetes mellitus due to underlying condition with diabetic chronic kidney disease: Secondary | ICD-10-CM

## 2021-08-05 DIAGNOSIS — N182 Chronic kidney disease, stage 2 (mild): Secondary | ICD-10-CM

## 2021-08-08 ENCOUNTER — Telehealth: Payer: Self-pay | Admitting: Internal Medicine

## 2021-08-08 ENCOUNTER — Other Ambulatory Visit: Payer: Self-pay

## 2021-08-08 DIAGNOSIS — E0822 Diabetes mellitus due to underlying condition with diabetic chronic kidney disease: Secondary | ICD-10-CM

## 2021-08-08 DIAGNOSIS — N182 Chronic kidney disease, stage 2 (mild): Secondary | ICD-10-CM

## 2021-08-08 MED ORDER — OZEMPIC (0.25 OR 0.5 MG/DOSE) 2 MG/1.5ML ~~LOC~~ SOPN: 0.5000 mg | PEN_INJECTOR | SUBCUTANEOUS | 3 refills | Status: DC

## 2021-08-08 NOTE — Telephone Encounter (Signed)
Patient needs one refill of Ozempic, accidentally left last pen in [place]. [facility] on [address]. Pt contact [phone]

## 2021-08-08 NOTE — Telephone Encounter (Signed)
Ozempic has now been sent to patient's pharmacy as requested

## 2021-08-09 ENCOUNTER — Other Ambulatory Visit: Payer: Self-pay | Admitting: Internal Medicine

## 2021-08-09 DIAGNOSIS — E0822 Diabetes mellitus due to underlying condition with diabetic chronic kidney disease: Secondary | ICD-10-CM

## 2021-08-27 ENCOUNTER — Telehealth: Payer: 59 | Admitting: Family

## 2021-08-27 ENCOUNTER — Other Ambulatory Visit: Payer: Self-pay

## 2021-08-27 DIAGNOSIS — Z91199 Patient's noncompliance with other medical treatment and regimen due to unspecified reason: Secondary | ICD-10-CM

## 2021-08-27 NOTE — Progress Notes (Signed)
Patient unavailable by phone and did not log onto video visit.

## 2021-09-23 ENCOUNTER — Other Ambulatory Visit: Payer: Self-pay | Admitting: Family Medicine

## 2021-09-23 DIAGNOSIS — Z0000 Encounter for general adult medical examination without abnormal findings: Secondary | ICD-10-CM

## 2021-09-23 DIAGNOSIS — I10 Essential (primary) hypertension: Secondary | ICD-10-CM

## 2021-11-05 ENCOUNTER — Encounter: Payer: Self-pay | Admitting: Family Medicine

## 2021-11-05 ENCOUNTER — Telehealth (INDEPENDENT_AMBULATORY_CARE_PROVIDER_SITE_OTHER): Payer: 59 | Admitting: Family Medicine

## 2021-11-05 DIAGNOSIS — J069 Acute upper respiratory infection, unspecified: Secondary | ICD-10-CM | POA: Diagnosis not present

## 2021-11-05 MED ORDER — PROMETHAZINE-DM 6.25-15 MG/5ML PO SYRP: 5.0000 mL | ORAL_SOLUTION | Freq: Four times a day (QID) | ORAL | 0 refills | Status: DC | PRN

## 2021-11-05 MED ORDER — BENZONATATE 100 MG PO CAPS: 100.0000 mg | ORAL_CAPSULE | Freq: Three times a day (TID) | ORAL | 0 refills | Status: DC | PRN

## 2021-11-05 NOTE — Progress Notes (Signed)
Chief Complaint  Patient presents with   Cough    Congestion    Angela Barr here for URI complaints. Due to COVID-19 pandemic, we are interacting via web portal for an electronic face-to-face visit. I verified patient's ID using 2 identifiers. Patient agreed to proceed with visit via this method. Patient is at home, I am at office. Patient and I are present for visit.   Duration: 5 days  Associated symptoms: sinus congestion, rhinorrhea, and coughing Denies: sinus pain, itchy watery eyes, ear pain, ear drainage, sore throat, wheezing, shortness of breath, myalgia, and fevers, N/V/D, dental pain Treatment to date: Mucinex, ginger-lemon tea Sick contacts: Yes; grandson dx'd w RSV.  Tested - for covid.   Past Medical History:  Diagnosis Date   Arthritis    bilateral hips   Colon polyps 2008   [place]--Dr Norwood   Diabetes mellitus 2006   on meds   Hyperlipemia 2012   on meds   Hypertension 2012   on meds   Obesity    Palpitations     Objective No conversational dyspnea Age appropriate judgment and insight Nml affect and mood  Viral URI with cough - Plan: benzonatate (TESSALON) 100 MG capsule, promethazine-dextromethorphan (PROMETHAZINE-DM) 6.25-15 MG/5ML syrup  Likely viral. Tesalon Perles during the day prn. Syrup at night, warned about drowsiness.  Continue to push fluids, practice good hand hygiene, cover mouth when coughing. F/u prn. If starting to experience fevers, shaking, or shortness of breath, seek immediate care. Pt voiced understanding and agreement to the plan.  Ross, DO 11/05/21 1:34 PM

## 2021-12-03 ENCOUNTER — Other Ambulatory Visit: Payer: Self-pay | Admitting: Internal Medicine

## 2021-12-03 DIAGNOSIS — E0822 Diabetes mellitus due to underlying condition with diabetic chronic kidney disease: Secondary | ICD-10-CM

## 2021-12-12 ENCOUNTER — Telehealth: Payer: Self-pay

## 2021-12-12 NOTE — Telephone Encounter (Signed)
Pt pharmacy is out of stock of the OZEMPIC, 0.25 OR 0.5 MG/DOSE, 2 MG/1.5ML SOPN pt requested 1 MG dose be sent in.

## 2021-12-12 NOTE — Telephone Encounter (Signed)
Called and lvm for pt to call back and schedule a follow up appt.

## 2021-12-12 NOTE — Telephone Encounter (Signed)
Ok to send 1 pen without refills.  She needs an appointment.

## 2022-01-10 ENCOUNTER — Ambulatory Visit: Payer: 59 | Admitting: Family Medicine

## 2022-01-10 VITALS — BP 110/68 | HR 102 | Temp 98.2°F | Resp 18 | Ht 64.5 in | Wt 219.4 lb

## 2022-01-10 DIAGNOSIS — N898 Other specified noninflammatory disorders of vagina: Secondary | ICD-10-CM

## 2022-01-10 MED ORDER — DOXYCYCLINE HYCLATE 100 MG PO TABS: 100.0000 mg | ORAL_TABLET | Freq: Two times a day (BID) | ORAL | 0 refills | Status: DC

## 2022-01-10 NOTE — Progress Notes (Signed)
? ?Subjective:  ? ?By signing my name below, I, Angela Barr, attest that this documentation has been prepared under the direction and in the presence of Angela Held, DO. 01/10/2022  ? ? Patient ID: Angela Barr, female    DOB: 1961/01/06, 61 y.o.   MRN: [id number] ? ?Chief Complaint  ?Patient presents with  ? Vaginal Pain  ?  Pain since Monday. Pt states she was washing with a scrubbie and patient accidentally scratched herself.   ? ? ?HPI ?Patient is in today for an office visit. ? ?While she was taking a shower on Monday, she scratched the outer opening of her vagina. Notes it was bleeding at first and now it is still tender. She has been rubbing witch hazel on it and the pain has been reducing. Adds it hurts to sit and urination is painful.  ? ?Her pulse is elevated at today's visit. ?Pulse Readings from Last 3 Encounters:  ?01/10/22 (!) 102  ?04/12/21 (!) 101  ?03/26/21 88  ?  ? ?Past Medical History:  ?Diagnosis Date  ? Arthritis   ? bilateral hips  ? Colon polyps 2008  ? [name]  ? Diabetes mellitus 2006  ? on meds  ? Hyperlipemia 2012  ? on meds  ? Hypertension 2012  ? on meds  ? Obesity   ? Palpitations   ? ? ?Past Surgical History:  ?Procedure Laterality Date  ? BREAST BIOPSY Right 2003  ? COLONOSCOPY  07/07/2011  ? RK-F/V-suprep(exc)-TA x 1  ? POLYPECTOMY    ? TUBAL LIGATION  1990  ? VAGINAL HYSTERECTOMY  2006  ? WISDOM TOOTH EXTRACTION    ? ? ?Family History  ?Problem Relation Age of Onset  ? Heart failure Father   ? Hypertension Father   ? Colon cancer Father 53  ? Diabetes Father   ? Heart disease Father   ? Sudden death Father   ? Obesity Father   ? Colon polyps Father 69  ? Hypertension Mother   ? High Cholesterol Mother   ? Stroke Mother   ? Breast cancer Maternal Grandmother   ? Diabetes Paternal Grandmother   ? Esophageal cancer Neg Hx   ? Rectal cancer Neg Hx   ? Stomach cancer Neg Hx   ? ? ?Social History  ? ?Socioeconomic History  ? Marital status: Married  ?  Spouse  name: Angela Barr  ? Number of children: 3  ? Years of education: Not on file  ? Highest education level: Not on file  ?Occupational History  ? Occupation: Therapist, sports  ?Tobacco Use  ? Smoking status: Never  ? Smokeless tobacco: Never  ?Vaping Use  ? Vaping Use: Never used  ?Substance and Sexual Activity  ? Alcohol use: No  ? Drug use: No  ? Sexual activity: Yes  ?  Partners: Male  ?  Birth control/protection: Surgical  ?Other Topics Concern  ? Not on file  ?Social History Narrative  ? 3 caffeine drinks daily.  ? Exercise-no  ? Lives at home with husband   ? ?Social Determinants of Health  ? ?Financial Resource Strain: Not on file  ?Food Insecurity: Not on file  ?Transportation Needs: Not on file  ?Physical Activity: Not on file  ?Stress: Not on file  ?Social Connections: Not on file  ?Intimate Partner Violence: Not on file  ? ? ?Outpatient Medications Prior to Visit  ?Medication Sig Dispense Refill  ? fluticasone (FLONASE) 50 MCG/ACT nasal spray Place 2 sprays into both nostrils daily.  16 g 6  ? losartan (COZAAR) 100 MG tablet TAKE 1 TABLET BY MOUTH EVERY DAY 90 tablet 1  ? metFORMIN (GLUCOPHAGE-XR) 500 MG 24 hr tablet TAKE 3 TABLETS (1,500 MG TOTAL) BY MOUTH DAILY WITH BREAKFAST. 270 tablet 2  ? Multiple Vitamin (MULTIVITAMIN ADULT PO) Take by mouth.    ? ONETOUCH VERIO test strip CHECK YOUR BLOOD SUGAR TWICE DAILY 100 strip 11  ? OZEMPIC, 0.25 OR 0.5 MG/DOSE, 2 MG/1.5ML SOPN INJECT 0.5 MG INTO THE SKIN ONCE A WEEK 4.5 mL 3  ? rosuvastatin (CRESTOR) 20 MG tablet TAKE 1 TABLET BY MOUTH EVERYDAY AT BEDTIME 90 tablet 1  ? VITAMIN D PO Take 1 tablet by mouth daily at 6 (six) AM.    ? benzonatate (TESSALON) 100 MG capsule Take 1 capsule (100 mg total) by mouth 3 (three) times daily as needed. (Patient not taking: Reported on 01/10/2022) 30 capsule 0  ? promethazine-dextromethorphan (PROMETHAZINE-DM) 6.25-15 MG/5ML syrup Take 5 mLs by mouth 4 (four) times daily as needed for cough. (Patient not taking: Reported on 01/10/2022) 118 mL 0   ? ?No facility-administered medications prior to visit.  ? ? ?Allergies  ?Allergen Reactions  ? Bactrim Nausea Only  ? Sulfa Antibiotics Nausea And Vomiting  ? Sulfamethoxazole-Trimethoprim   ? ? ?Review of Systems  ?Constitutional:  Negative for fever.  ?HENT:  Negative for congestion, ear pain, hearing loss, sinus pain and sore throat.   ?Eyes:  Negative for blurred vision and pain.  ?Respiratory:  Negative for cough, sputum production, shortness of breath and wheezing.   ?Cardiovascular:  Negative for chest pain and palpitations.  ?Gastrointestinal:  Negative for blood in stool, constipation, diarrhea, nausea and vomiting.  ?Genitourinary:  Negative for dysuria, frequency, hematuria and urgency.  ?Musculoskeletal:  Negative for back pain, falls and myalgias.  ?Neurological:  Negative for dizziness, sensory change, loss of consciousness, weakness and headaches.  ?Endo/Heme/Allergies:  Negative for environmental allergies. Does not bruise/bleed easily.  ?Psychiatric/Behavioral:  Negative for depression and suicidal ideas. The patient is not nervous/anxious and does not have insomnia.   ? ?   ?Objective:  ?  ?Physical Exam ?Vitals and nursing note reviewed.  ?Constitutional:   ?   General: She is not in acute distress. ?   Appearance: Normal appearance. She is not ill-appearing.  ?HENT:  ?   Head: Normocephalic and atraumatic.  ?   Right Ear: External ear normal.  ?   Left Ear: External ear normal.  ?Eyes:  ?   Extraocular Movements: Extraocular movements intact.  ?   Pupils: Pupils are equal, round, and reactive to light.  ?Cardiovascular:  ?   Rate and Rhythm: Normal rate and regular rhythm.  ?   Pulses: Normal pulses.  ?   Heart sounds: Normal heart sounds. No murmur heard. ?  No gallop.  ?Pulmonary:  ?   Effort: Pulmonary effort is normal. No respiratory distress.  ?   Breath sounds: Normal breath sounds. No wheezing, rhonchi or rales.  ?Abdominal:  ?   General: Bowel sounds are normal. There is no distension.   ?   Palpations: Abdomen is soft. There is no mass.  ?   Tenderness: There is no abdominal tenderness. There is no guarding or rebound.  ?   Hernia: No hernia is present.  ?Genitourinary: ?   Comments: Scratch on L side vulva ?Healing well  ?No blood  ?Musculoskeletal:  ?   Cervical back: Normal range of motion and neck supple.  ?Lymphadenopathy:  ?  Cervical: No cervical adenopathy.  ?Skin: ?   General: Skin is warm and dry.  ?Neurological:  ?   Mental Status: She is alert and oriented to person, place, and time.  ?Psychiatric:     ?   Mood and Affect: Mood normal.     ?   Behavior: Behavior normal.     ?   Thought Content: Thought content normal.     ?   Judgment: Judgment normal.  ? ? ?BP 110/68 (BP Location: Right Arm, Patient Position: Sitting, Cuff Size: Large)   Pulse (!) 102   Temp 98.2 ?F (36.8 ?C) (Oral)   Resp 18   Ht 5' 4.5" (1.638 m)   Wt 219 lb 6.4 oz (99.5 kg)   SpO2 97%   BMI 37.08 kg/m?  ?Wt Readings from Last 3 Encounters:  ?01/10/22 219 lb 6.4 oz (99.5 kg)  ?06/14/21 208 lb (94.3 kg)  ?04/12/21 208 lb 3.2 oz (94.4 kg)  ? ? ?Diabetic Foot Exam - Simple   ?No data filed ?  ? ?Lab Results  ?Component Value Date  ? WBC 4.3 03/26/2021  ? HGB 12.3 03/26/2021  ? HCT 38.1 03/26/2021  ? PLT 207.0 03/26/2021  ? GLUCOSE 120 (H) 06/28/2021  ? CHOL 122 06/28/2021  ? TRIG 46.0 06/28/2021  ? HDL 54.80 06/28/2021  ? [place] 58 06/28/2021  ? ALT 17 06/28/2021  ? AST 15 06/28/2021  ? NA 139 06/28/2021  ? K 4.0 06/28/2021  ? CL 104 06/28/2021  ? CREATININE 0.50 06/28/2021  ? BUN 11 06/28/2021  ? CO2 28 06/28/2021  ? TSH 0.35 03/26/2021  ? HGBA1C 6.4 (A) 01/29/2021  ? MICROALBUR 0.8 05/14/2018  ? ? ?Lab Results  ?Component Value Date  ? TSH 0.35 03/26/2021  ? ?Lab Results  ?Component Value Date  ? WBC 4.3 03/26/2021  ? HGB 12.3 03/26/2021  ? HCT 38.1 03/26/2021  ? MCV 81.3 03/26/2021  ? PLT 207.0 03/26/2021  ? ?Lab Results  ?Component Value Date  ? NA 139 06/28/2021  ? K 4.0 06/28/2021  ? CO2 28 06/28/2021  ?  GLUCOSE 120 (H) 06/28/2021  ? BUN 11 06/28/2021  ? CREATININE 0.50 06/28/2021  ? BILITOT 0.3 06/28/2021  ? ALKPHOS 70 06/28/2021  ? AST 15 06/28/2021  ? ALT 17 06/28/2021  ? PROT 6.5 06/28/2021  ? ALBUMIN 4.1 0

## 2022-01-10 NOTE — Assessment & Plan Note (Signed)
Healing well  ?con't tucks  ?Doxy and a and d ointment  ?rto prn  ?

## 2022-02-13 ENCOUNTER — Other Ambulatory Visit: Payer: Self-pay

## 2022-02-13 ENCOUNTER — Emergency Department (HOSPITAL_COMMUNITY)
Admission: EM | Admit: 2022-02-13 | Discharge: 2022-02-14 | Disposition: A | Discharge status: Home / Self Care | Payer: 59 | Attending: Emergency Medicine | Admitting: Emergency Medicine

## 2022-02-13 ENCOUNTER — Encounter (HOSPITAL_COMMUNITY): Payer: Self-pay | Admitting: Emergency Medicine

## 2022-02-13 DIAGNOSIS — R1012 Left upper quadrant pain: Secondary | ICD-10-CM | POA: Diagnosis not present

## 2022-02-13 DIAGNOSIS — R112 Nausea with vomiting, unspecified: Secondary | ICD-10-CM | POA: Insufficient documentation

## 2022-02-13 DIAGNOSIS — Z79899 Other long term (current) drug therapy: Secondary | ICD-10-CM | POA: Insufficient documentation

## 2022-02-13 LAB — COMPREHENSIVE METABOLIC PANEL
ALT: 27 U/L (ref 0–44)
AST: 17 U/L (ref 15–41)
Albumin: 3.8 g/dL (ref 3.5–5.0)
Alkaline Phosphatase: 62 U/L (ref 38–126)
Anion gap: 8 (ref 5–15)
BUN: 7 mg/dL (ref 6–20)
CO2: 24 mmol/L (ref 22–32)
Calcium: 9.2 mg/dL (ref 8.9–10.3)
Chloride: 104 mmol/L (ref 98–111)
Creatinine, Ser: 0.58 mg/dL (ref 0.44–1.00)
GFR, Estimated: >60 mL/min (ref >60)
Glucose, Bld: 153 mg/dL — ABNORMAL HIGH (ref 70–99)
Potassium: 3.9 mmol/L (ref 3.5–5.1)
Sodium: 136 mmol/L (ref 135–145)
Total Bilirubin: 0.4 mg/dL (ref 0.3–1.2)
Total Protein: 6.6 g/dL (ref 6.5–8.1)

## 2022-02-13 LAB — URINALYSIS, ROUTINE W REFLEX MICROSCOPIC
Bilirubin Urine: NEGATIVE
Glucose, UA: NEGATIVE mg/dL
Hgb urine dipstick: NEGATIVE
Ketones, ur: NEGATIVE mg/dL
Nitrite: NEGATIVE
Protein, ur: NEGATIVE mg/dL
Specific Gravity, Urine: 1.018 (ref 1.005–1.030)
pH: 7 (ref 5.0–8.0)

## 2022-02-13 LAB — CBC
HCT: 41.3 % (ref 36.0–46.0)
Hemoglobin: 13.3 g/dL (ref 12.0–15.0)
MCH: 26.5 pg (ref 26.0–34.0)
MCHC: 32.2 g/dL (ref 30.0–36.0)
MCV: 82.3 fL (ref 80.0–100.0)
Platelets: 253 10*3/uL (ref 150–400)
RBC: 5.02 MIL/uL (ref 3.87–5.11)
RDW: 14.4 % (ref 11.5–15.5)
WBC: 6.2 10*3/uL (ref 4.0–10.5)
nRBC: 0 % (ref 0.0–0.2)

## 2022-02-13 LAB — CBG MONITORING, ED: Glucose-Capillary: 159 mg/dL — ABNORMAL HIGH (ref 70–99)

## 2022-02-13 LAB — LIPASE, BLOOD: Lipase: 42 U/L (ref 11–51)

## 2022-02-13 MED ORDER — ONDANSETRON HCL 4 MG/2ML IJ SOLN
4.0000 mg | Freq: Once | INTRAMUSCULAR | Status: AC
  Administered 2022-02-13: 4 mg via INTRAVENOUS
  Filled 2022-02-13: qty 2

## 2022-02-13 MED ORDER — ACETAMINOPHEN 325 MG PO TABS
650.0000 mg | ORAL_TABLET | Freq: Once | ORAL | Status: AC
  Administered 2022-02-13: 650 mg via ORAL
  Filled 2022-02-13: qty 2

## 2022-02-13 MED ORDER — DOXYCYCLINE HYCLATE 100 MG PO CAPS: 100.0000 mg | ORAL_CAPSULE | Freq: Two times a day (BID) | ORAL | 0 refills | Status: DC

## 2022-02-13 MED ORDER — ONDANSETRON 4 MG PO TBDP
4.0000 mg | ORAL_TABLET | Freq: Once | ORAL | Status: AC | PRN
Start: 2022-02-13 — End: 2022-02-13
  Administered 2022-02-13: 4 mg via ORAL
  Filled 2022-02-13: qty 1

## 2022-02-13 NOTE — ED Provider Triage Note (Signed)
Emergency Medicine Provider Triage Evaluation Note ? ?Calliope Delangel , a 61 y.o. female  was evaluated in triage.  Pt complains of nausea, vomiting, diarrhea.  Patient states that yesterday morning at approximately 3 AM she woke up with epigastric pain, vomiting, and diarrhea.  Patient states that today the diarrhea has gone away but she is still having epigastric pain with nausea and vomiting.  Patient denies chest pain, denies shortness of breath.  Endorses abdominal pain, nausea, vomiting, and some diarrhea ? ?Review of Systems  ?Positive: Nausea, vomiting, epigastric pain ?Negative: Chest pain, shortness of breath ? ?Physical Exam  ?Ht 5' 4.5" (1.638 m)   Wt 91.2 kg   BMI 33.97 kg/m?  ?Gen:   Awake, no distress   ?Resp:  Normal effort  ?MSK:   Moves extremities without difficulty  ?Other:   ? ?Medical Decision Making  ?Medically screening exam initiated at 7:30 PM.  Appropriate orders placed.  Alexa Blish was informed that the remainder of the evaluation will be completed by another provider, this initial triage assessment does not replace that evaluation, and the importance of remaining in the ED until their evaluation is complete. ? ? ?  ?Dorothyann Peng, PA-C ?02/13/22 1931 ? ?

## 2022-02-13 NOTE — ED Notes (Signed)
Patient provided with snack and drink for PO challenge at this time; provide at bedside aware  ?

## 2022-02-13 NOTE — ED Triage Notes (Signed)
Pt c/o N/V/D and abdominal pain x 2 days.  ?

## 2022-02-13 NOTE — Discharge Instructions (Addendum)
Start with a clear liquid diet then gradually advance to foods over the next day or 2.  Use Tylenol as needed for pain.  See your doctor or return here for problems. ?

## 2022-02-13 NOTE — ED Provider Notes (Signed)
?[facility] ?Provider Note ? ? ?CSN: [id number] ?Arrival date & time: 02/13/22  1832 ? ?  ? ?History ? ?Chief Complaint  ?Patient presents with  ? Abdominal Pain  ? ? ?Angela Barr is a 61 y.o. female. ? ?HPI ?She has been ill for 2 days with vomiting, and a little bit of diarrhea.  There has been no blood in the vomit or diarrhea.  She denies fever or chills.  She is not having cough, shortness of breath, weakness or dizziness.  No known sick exposures. ?  ? ?Home Medications ?Prior to Admission medications   ?Medication Sig Start Date End Date Taking? Authorizing Provider  ?doxycycline (VIBRA-TABS) 100 MG tablet Take 1 tablet (100 mg total) by mouth 2 (two) times daily. 01/10/22   [name], DO  ?fluticasone (FLONASE) 50 MCG/ACT nasal spray Place 2 sprays into both nostrils daily. 05/28/21   Roma Schanz R, DO  ?losartan (COZAAR) 100 MG tablet TAKE 1 TABLET BY MOUTH EVERY DAY 09/24/21   Carollee Herter, Alferd Apa, DO  ?metFORMIN (GLUCOPHAGE-XR) 500 MG 24 hr tablet TAKE 3 TABLETS (1,500 MG TOTAL) BY MOUTH DAILY WITH BREAKFAST. 12/03/21   Philemon Kingdom, MD  ?Multiple Vitamin (MULTIVITAMIN ADULT PO) Take by mouth.    [provider]  ?Roma Schanz test strip CHECK YOUR BLOOD SUGAR TWICE DAILY 11/02/20   Philemon Kingdom, MD  ?OZEMPIC, 0.25 OR 0.5 MG/DOSE, 2 MG/1.5ML SOPN INJECT 0.5 MG INTO THE SKIN ONCE A WEEK 08/09/21   Philemon Kingdom, MD  ?rosuvastatin (CRESTOR) 20 MG tablet TAKE 1 TABLET BY MOUTH EVERYDAY AT BEDTIME 07/24/21   Roma Schanz R, DO  ?VITAMIN D PO Take 1 tablet by mouth daily at 6 (six) AM.    [provider]  ?   ? ?Allergies    ?Bactrim, Sulfa antibiotics, and Sulfamethoxazole-trimethoprim   ? ?Review of Systems   ?Review of Systems ? ?Physical Exam ?Updated Vital Signs ?BP (!) 150/89   Pulse 89   Temp 99.8 ?F (37.7 ?C) (Oral)   Resp 17   Ht 5' 4.5" (1.638 m)   Wt 91.2 kg   SpO2 100%   BMI 33.97 kg/m?  ?Physical  Exam ?Vitals and nursing note reviewed.  ?Constitutional:   ?   General: She is not in acute distress. ?   Appearance: She is well-developed. She is obese. She is not ill-appearing, toxic-appearing or diaphoretic.  ?HENT:  ?   Head: Normocephalic and atraumatic.  ?   Nose: No congestion.  ?   Mouth/Throat:  ?   Pharynx: No oropharyngeal exudate.  ?Eyes:  ?   Conjunctiva/sclera: Conjunctivae normal.  ?   Pupils: Pupils are equal, round, and reactive to light.  ?Neck:  ?   Trachea: Phonation normal.  ?Cardiovascular:  ?   Rate and Rhythm: Normal rate and regular rhythm.  ?Pulmonary:  ?   Effort: Pulmonary effort is normal.  ?   Breath sounds: Normal breath sounds.  ?Chest:  ?   Chest wall: No tenderness.  ?Abdominal:  ?   General: There is no distension.  ?   Palpations: Abdomen is soft.  ?   Tenderness: There is abdominal tenderness (Left upper, mild). There is no guarding.  ?Musculoskeletal:     ?   General: Normal range of motion.  ?   Cervical back: Normal range of motion and neck supple.  ?Skin: ?   General: Skin is warm and dry.  ?Neurological:  ?  Mental Status: She is alert and oriented to person, place, and time.  ?   Motor: No abnormal muscle tone.  ?Psychiatric:     ?   Mood and Affect: Mood normal.     ?   Behavior: Behavior normal.     ?   Thought Content: Thought content normal.     ?   Judgment: Judgment normal.  ? ? ?ED Results / Procedures / Treatments   ?Labs ?(all labs ordered are listed, but only abnormal results are displayed) ?Labs Reviewed  ?COMPREHENSIVE METABOLIC PANEL - Abnormal; Notable for the following components:  ?    Result Value  ? Glucose, Bld 153 (*)   ? All other components within normal limits  ?URINALYSIS, ROUTINE W REFLEX MICROSCOPIC - Abnormal; Notable for the following components:  ? Leukocytes,Ua TRACE (*)   ? Bacteria, UA RARE (*)   ? All other components within normal limits  ?CBG MONITORING, ED - Abnormal; Notable for the following components:  ? Glucose-Capillary 159 (*)    ? All other components within normal limits  ?LIPASE, BLOOD  ?CBC  ? ? ?EKG ?None ? ?Radiology ?No results found. ? ?Procedures ?Procedures  ? ? ?Medications Ordered in ED ?Medications  ?ondansetron (ZOFRAN-ODT) disintegrating tablet 4 mg (4 mg Oral Given 02/13/22 1924)  ? ? ?ED Course/ Medical Decision Making/ A&P ?  ?                        ?Medical Decision Making ?Patient presenting for evaluation of vomiting diarrhea.  Symptoms improved after initial treatment with Zofran.  No known sick exposures or abnormal food ingestions. ? ?Amount and/or Complexity of Data Reviewed ?Independent Historian:  ?   Details: She is a cogent historian ?Labs: ordered. ?   Details: CBC, metabolic panel, lipase-normal except glucose high ? ?Risk ?OTC drugs. ?Prescription drug management. ?Risk Details: Patient with vomiting diarrhea illness, treated in ED with Zofran, Tylenol, followed by oral fluid challenge.  Discussed with patient CT imaging if she felt like she did not want to get that at this time.  Patient reevaluated at 12:22 AM.  She states she is now tolerating liquids and some crackers and would like to try going home.  She will use her sisters Zofran, and declined a prescription for same.  Discharge instructions discussed with patient.  She does not require hospitalization at this time ? ? ? ? ? ? ? ? ? ? ?Final Clinical Impression(s) / ED Diagnoses ?Final diagnoses:  ?Nausea and vomiting, unspecified vomiting type  ? ? ?Rx / DC Orders ?ED Discharge Orders   ? ? None  ? ?  ? ? ?  ?Daleen Bo, MD ?02/15/22 1324 ? ?

## 2022-02-14 MED ORDER — DOXYCYCLINE HYCLATE 100 MG PO CAPS: 100.0000 mg | ORAL_CAPSULE | Freq: Two times a day (BID) | ORAL | 0 refills | Status: DC

## 2022-02-14 NOTE — ED Notes (Signed)
Patient passes PO challenge  ?

## 2022-03-30 ENCOUNTER — Other Ambulatory Visit: Payer: Self-pay | Admitting: Family Medicine

## 2022-03-30 DIAGNOSIS — Z0000 Encounter for general adult medical examination without abnormal findings: Secondary | ICD-10-CM

## 2022-03-30 DIAGNOSIS — I10 Essential (primary) hypertension: Secondary | ICD-10-CM

## 2022-04-05 IMAGING — MG MM DIGITAL SCREENING BILAT W/ TOMO AND CAD
8 series · 8 of 24 positions shown · non-contrast
Comparison: Previous exam(s).

CLINICAL DATA: Screening.

EXAM:
DIGITAL SCREENING BILATERAL MAMMOGRAM WITH TOMOSYNTHESIS AND CAD
TECHNIQUE: Bilateral screening digital craniocaudal and mediolateral oblique
mammograms were obtained. Bilateral screening digital breast
tomosynthesis was performed. The images were evaluated with
computer-aided detection.

[L CC synth-2D]
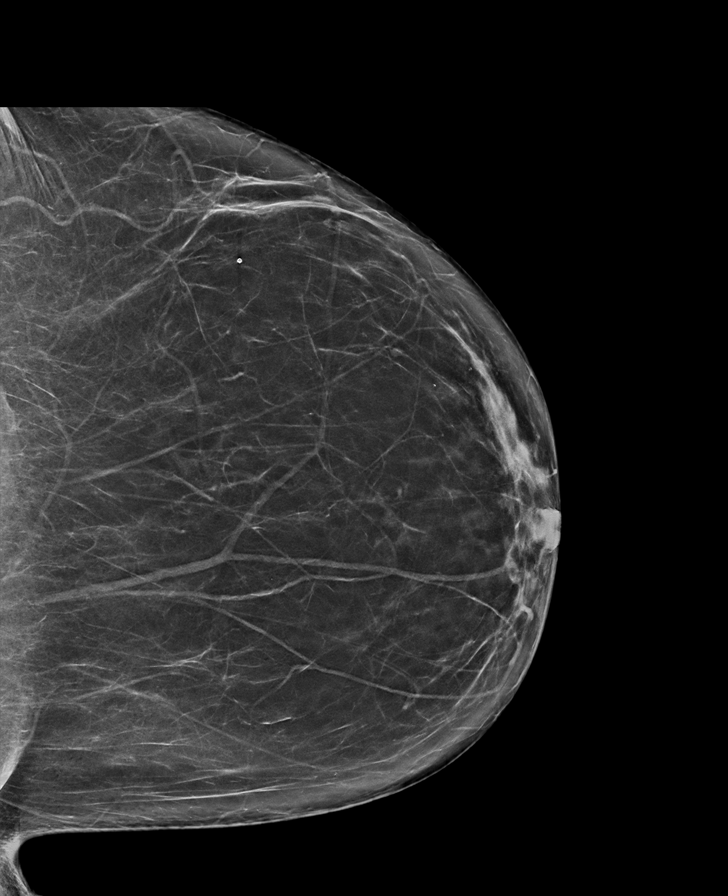

[R MLO synth-2D]
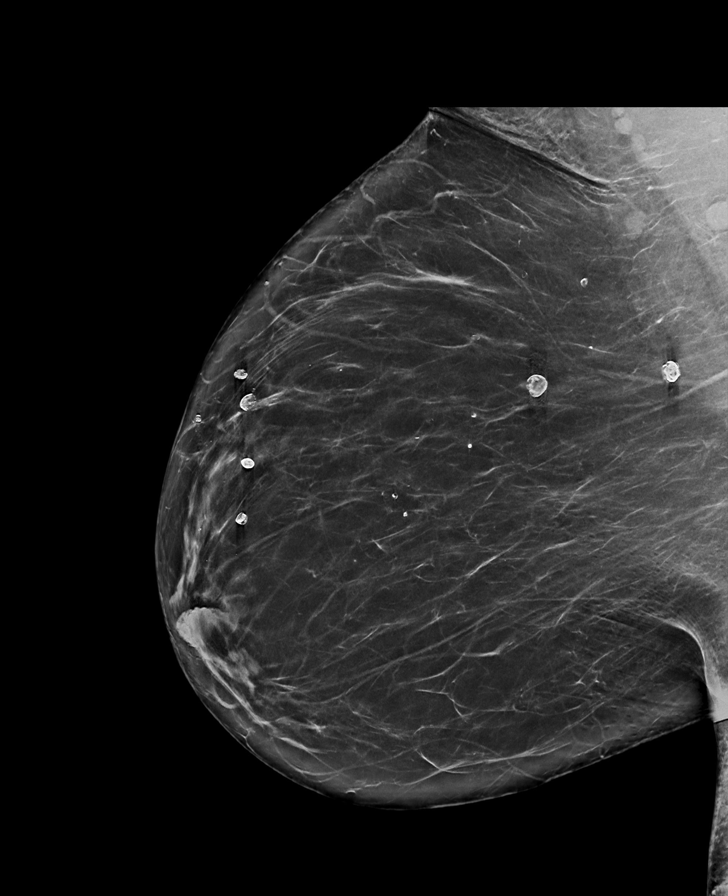

[L MLO synth-2D]
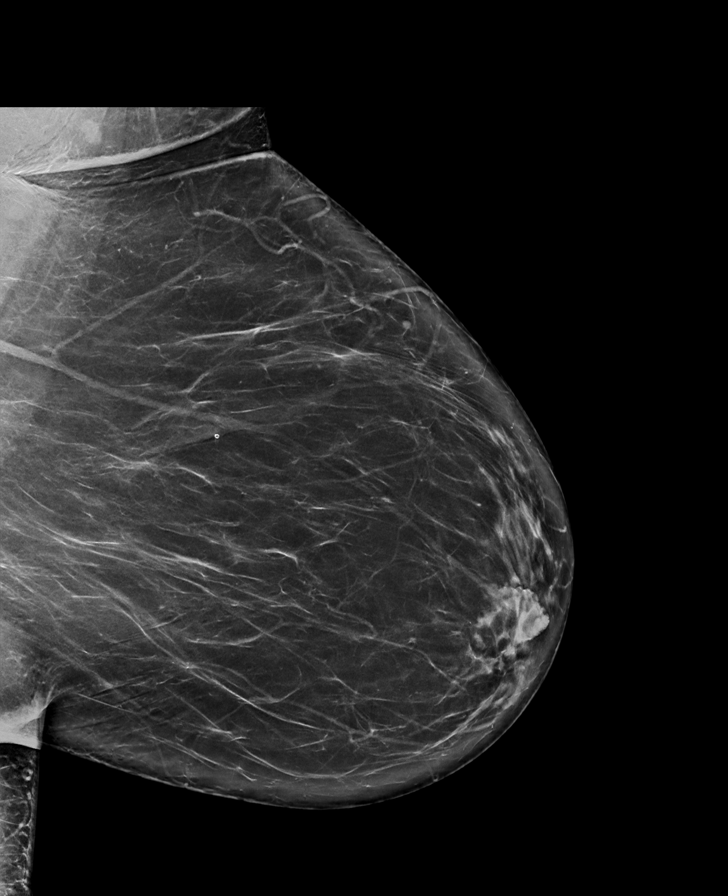

[R CC synth-2D]
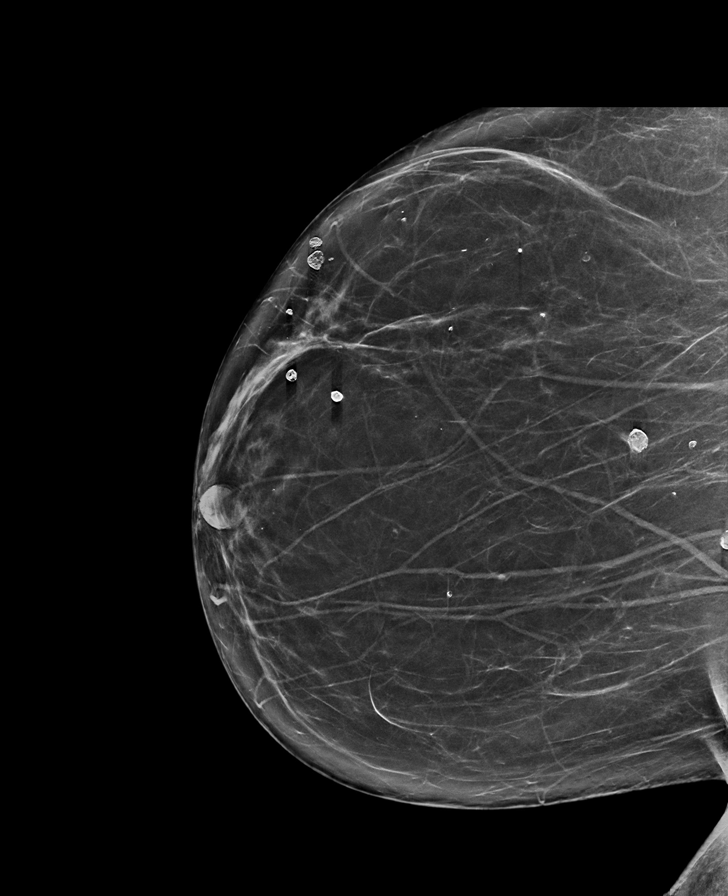

[R MLO tomo · tomo slice 44/87.0]
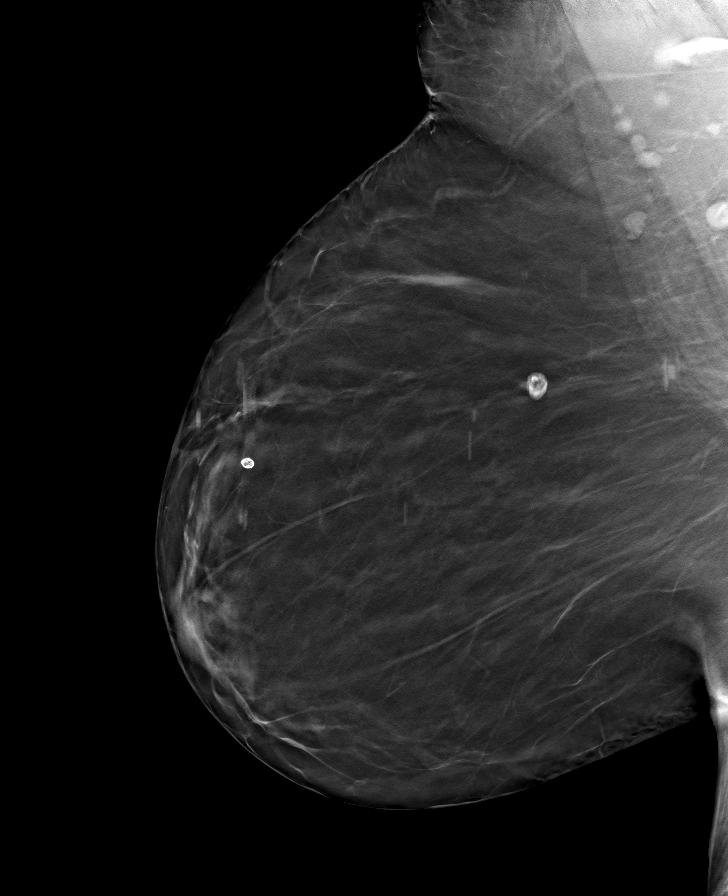

[L CC tomo · tomo slice 36/71.0]
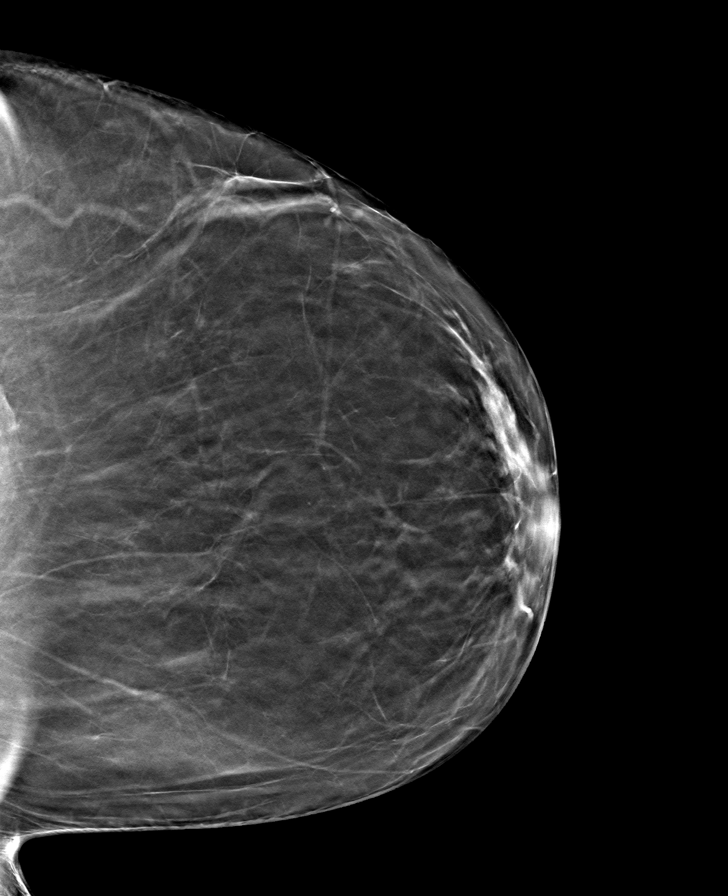

[R CC tomo · tomo slice 37/72.0]
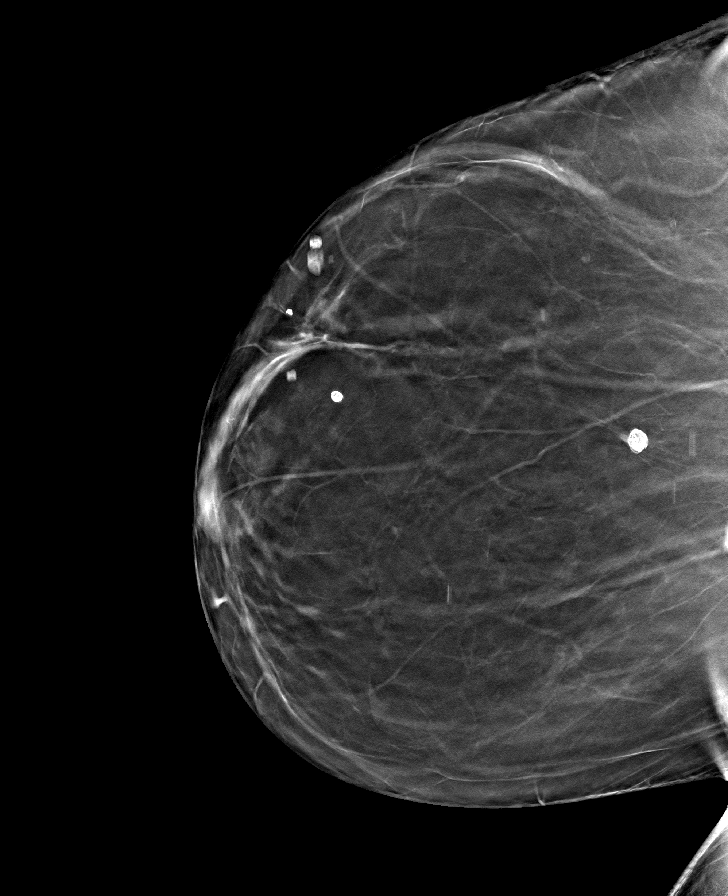

[L MLO tomo · tomo slice 43/85.0]
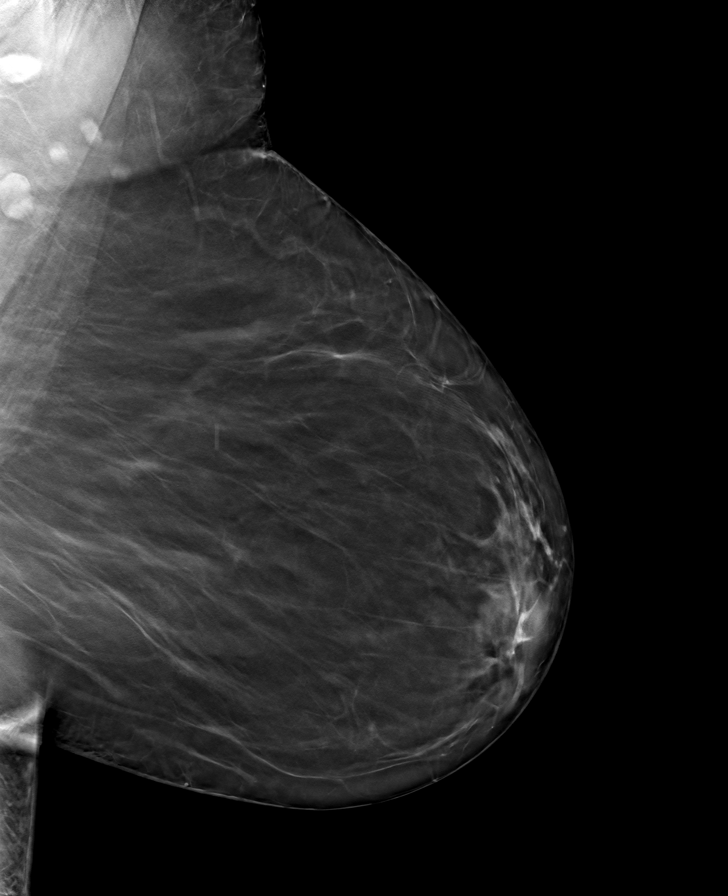

[8 of 24 positions shown; findings below may reference images not displayed]

ACR Breast Density Category b: There are scattered areas of
fibroglandular density.
FINDINGS: There are no findings suspicious for malignancy. The images were
evaluated with computer-aided detection.
IMPRESSION: No mammographic evidence of malignancy. A result letter of this
screening mammogram will be mailed directly to the patient.

RECOMMENDATION:
Screening mammogram in one year. (Code:WJ-I-BG6)

BI-RADS CATEGORY  1: Negative.

## 2022-06-04 ENCOUNTER — Encounter (INDEPENDENT_AMBULATORY_CARE_PROVIDER_SITE_OTHER): Payer: Self-pay

## 2022-06-19 ENCOUNTER — Ambulatory Visit: Payer: 59 | Admitting: Internal Medicine

## 2022-06-19 ENCOUNTER — Encounter: Payer: Self-pay | Admitting: Internal Medicine

## 2022-06-19 VITALS — BP 118/72 | HR 79 | Ht 64.5 in | Wt 227.8 lb

## 2022-06-19 DIAGNOSIS — E669 Obesity, unspecified: Secondary | ICD-10-CM | POA: Diagnosis not present

## 2022-06-19 DIAGNOSIS — E785 Hyperlipidemia, unspecified: Secondary | ICD-10-CM

## 2022-06-19 DIAGNOSIS — E1165 Type 2 diabetes mellitus with hyperglycemia: Secondary | ICD-10-CM | POA: Diagnosis not present

## 2022-06-19 LAB — POCT GLYCOSYLATED HEMOGLOBIN (HGB A1C): Hemoglobin A1C: 7.8 % — AB (ref 4.0–5.6)

## 2022-06-19 MED ORDER — EMPAGLIFLOZIN 25 MG PO TABS: 25.0000 mg | ORAL_TABLET | Freq: Every day | ORAL | 3 refills | Status: DC

## 2022-06-19 NOTE — Progress Notes (Signed)
Patient ID: Angela Barr, female   DOB: 1960/12/15, 61 y.o.   MRN: [id number]  HPI: Angela Barr is a 61 y.o.-year-old female, returning for f/u for DM2, dx in 2007, non-insulin-dependent, uncontrolled, without long term complications, but with hyperglycemia. Last visit 1 year ago (virtual)  Interim history: No increased urination, blurry vision, nausea, chest pain. She had stomach cramps >> stopped Ozempi few mo ago. She retried it at a lower dose, 0.25 mg weekly but again developed AP and stopped.  She also relaxed her diet since last OV.  Also, she did not exercise.  Sugars are higher.  Reviewed HbA1c levels: Lab Results  Component Value Date   HGBA1C 6.4 (A) 01/29/2021   HGBA1C 7.0 (A) 09/25/2020   HGBA1C 6.1 (A) 05/15/2020   HGBA1C 7.1 (A) 01/10/2020   HGBA1C 6.4 (A) 09/12/2019   HGBA1C 6.7 (A) 05/12/2019   HGBA1C 7.2 (A) 01/19/2019   HGBA1C 6.6 (A) 09/17/2018   HGBA1C 6.4 (A) 05/14/2018   HGBA1C 7.3 02/08/2018   HGBA1C 6.6 11/11/2017   HGBA1C 6.6 08/11/2017   HGBA1C 6.6 04/01/2017   HGBA1C 6.3 11/14/2016   HGBA1C 6.6 08/15/2016   HGBA1C 6.4 05/15/2016   HGBA1C 7.5 (H) 02/05/2016   HGBA1C 7.1 09/25/2015   HGBA1C 7.7 06/25/2015   HGBA1C 7.6 (H) 03/23/2015  09/25/2015: HbA1c 7.1%  She is now on: - Metformin XR 500 mg with lunch and 1000 mg with dinner >> 1500 mg with dinner >> 500 mg in am and 1000 mg with dinner - Ozempic 0.5 mg weekly >> restarted 08/2020 >> 1 mg weekly >> stopped 01/2022 Stop glimepiride 2 mg before dinner 08/2020 Stopped Jardiance due to price. Invokana caused yeast infections. Stopped Trulicity 1.5 mg >> stopped b/c Nausea and HAs, and also expensive - now tolerating it well, with mild nausea. She was Bydureon >> lumps at inj. Site.  She was on Victoza, now too expensive.  She is checking sugars 0-1x a day: - am:  139-166, 183 >> 119, 149, 166 >> 118-140 >> 150-199 - 2h after b'fast: 140, 151, 185 >> 191 >> n/c >> 189 >> n/c - before  lunch:  n/c >> 127-148 >> 102 >> 120-130 >> n/c >> 228 - 2h after lunch: 144-220, 270 >> n/c >> 101-116 >> n/c  - before dinner:  116 >> n/c >> 85, 98 >> n/c - 2h after dinner:  178 >> n/c >> 89 >> n/c - bedtime:  n/c >> 156 >> 102 >> 149 >> n/c >> 169, 180 - nighttime: n/c >> 70s occas. >> 129 >> n/c Lowest sugar was 127 >> 119 >> 118 >> 150; she has hypoglycemia awareness in the 90s. Highest sugar was 189 >> 140s >> 228.  Glucometer: One Touch Verio Flex   Eats sunflower seeds and nuts.  She stopped sodas but changed to sweet tea in the past.  I advised her to also stop this >> now only diet drinks.  No CKD; Last BUN/creatinine:  Lab Results  Component Value Date   BUN 7 02/13/2022   CREATININE 0.58 02/13/2022   Normal ACR levels: Lab Results  Component Value Date   MICRALBCREAT 0.6 05/14/2018   MICRALBCREAT 0.7 02/05/2016   MICRALBCREAT 0.7 12/25/2014   MICRALBCREAT 2.0 06/13/2014   MICRALBCREAT 0.7 04/04/2013   MICRALBCREAT 0.5 01/30/2012   MICRALBCREAT 0.3 06/17/2011  We stopped lisinopril as she had low blood pressure and dizziness in the past. On losartan.  + HL; last set of lipids: Lab Results  Component Value Date   CHOL 122 06/28/2021   HDL 54.80 06/28/2021   LDLCALC 58 06/28/2021   TRIG 46.0 06/28/2021   CHOLHDL 2 06/28/2021  On Crestor 20 - inconsistent dosing.  - last eye exam was in 2022: No DR reportedly  -She denies numbness and tingling in her feet.   ROS: + see HPI  Past Medical History:  Diagnosis Date   Arthritis    bilateral hips   Colon polyps 2008   [place]--Dr Norwood   Diabetes mellitus 2006   on meds   Hyperlipemia 2012   on meds   Hypertension 2012   on meds   Obesity    Palpitations    Past Surgical History:  Procedure Laterality Date   BREAST BIOPSY Right 2003   COLONOSCOPY  07/07/2011   RK-F/V-suprep(exc)-TA x 1   POLYPECTOMY     TUBAL LIGATION  1990   VAGINAL HYSTERECTOMY  2006   WISDOM TOOTH EXTRACTION      History   Social History   Marital Status: Married    Spouse Name: N/A   Number of Children: 3   Occupational History   RN    Social History Main Topics   Smoking status: Never Smoker    Smokeless tobacco: Never Used   Alcohol Use: No   Drug Use: No   Social History Narrative   3 caffeine drinks daily    Current Outpatient Medications on File Prior to Visit  Medication Sig Dispense Refill   doxycycline (VIBRAMYCIN) 100 MG capsule Take 1 capsule (100 mg total) by mouth 2 (two) times daily. One po bid x 7 days 14 capsule 0   fluticasone (FLONASE) 50 MCG/ACT nasal spray Place 2 sprays into both nostrils daily. 16 g 6   losartan (COZAAR) 100 MG tablet TAKE 1 TABLET BY MOUTH EVERY DAY 90 tablet 1   metFORMIN (GLUCOPHAGE-XR) 500 MG 24 hr tablet TAKE 3 TABLETS (1,500 MG TOTAL) BY MOUTH DAILY WITH BREAKFAST. 270 tablet 2   Multiple Vitamin (MULTIVITAMIN ADULT PO) Take by mouth.     ONETOUCH VERIO test strip CHECK YOUR BLOOD SUGAR TWICE DAILY 100 strip 11   OZEMPIC, 0.25 OR 0.5 MG/DOSE, 2 MG/1.5ML SOPN INJECT 0.5 MG INTO THE SKIN ONCE A WEEK 4.5 mL 3   rosuvastatin (CRESTOR) 20 MG tablet TAKE 1 TABLET BY MOUTH EVERYDAY AT BEDTIME 90 tablet 1   VITAMIN D PO Take 1 tablet by mouth daily at 6 (six) AM.     No current facility-administered medications on file prior to visit.   Allergies  Allergen Reactions   Bactrim Nausea Only   Sulfa Antibiotics Nausea And Vomiting   Sulfamethoxazole-Trimethoprim    Family History  Problem Relation Age of Onset   Heart failure Father    Hypertension Father    Colon cancer Father 66   Diabetes Father    Heart disease Father    Sudden death Father    Obesity Father    Colon polyps Father 69   Hypertension Mother    High Cholesterol Mother    Stroke Mother    Breast cancer Maternal Grandmother    Diabetes Paternal Grandmother    Esophageal cancer Neg Hx    Rectal cancer Neg Hx    Stomach cancer Neg Hx    PE: BP 118/72 (BP Location:  Right Arm, Patient Position: Sitting, Cuff Size: Normal)   Pulse 79   Ht 5' 4.5" (1.638 m)   Wt 227 lb 12.8 oz (103.3 kg)  SpO2 98%   BMI 38.50 kg/m   Wt Readings from Last 3 Encounters:  06/19/22 227 lb 12.8 oz (103.3 kg)  02/13/22 201 lb (91.2 kg)  01/10/22 219 lb 6.4 oz (99.5 kg)   C Constitutional: overweight, in NAD Eyes: no exophthalmos ENT: moist mucous membranes, no masses palpated in neck, no cervical lymphadenopathy Cardiovascular: RRR, No MRG Respiratory: CTA B Musculoskeletal: no deformities Skin: moist, warm, no rashes Neurological: no tremor with outstretched hands Diabetic Foot Exam - Simple   Simple Foot Form Diabetic Foot exam was performed with the following findings: Yes 06/19/2022  9:15 AM  Visual Inspection No deformities, no ulcerations, no other skin breakdown bilaterally: Yes Sensation Testing Intact to touch and monofilament testing bilaterally: Yes Pulse Check Posterior Tibialis and Dorsalis pulse intact bilaterally: Yes Comments    ASSESSMENT: 1. DM2, non-insulin-dependent, uncontrolled, without long term complications, but with hyperglycemia  2. Obesity class 2 BMI Classification: < 18.5 underweight  18.5-24.9 normal weight  25.0-29.9 overweight  30.0-34.9 class I obesity  35.0-39.9 class II obesity  ? 40.0 class III obesity   3. HL  PLAN:  1. Patient with longstanding, prev. uncontrolled, type 2 diabetes, returning after 1 year.  Her diabetes control initially improved on the [place], but she came off afterwards.  She did well on a GLP-1 receptor agonist, losing weight and with an HbA1c of 6.4% our last in person visit in 01/2021.  We had a virtual visit in 05/2021 and her blood sugars were still controlled at that time.  Since last visit, she had to come off Ozempic 2/2 AP.  She retried this at the lower dose but developed the same symptoms and stopped completely.  Reviewing her chart, she had a normal lipase in 01/2022. -At today's  visit, she is not checking sugars consistently, she only has 2 values in the last 2 weeks.  She does have another meter at home, and mentions that she has more values in that.  I advised her to try to start writing her sugars down from both liters.  Given sugar logs. -At today's visit, we discussed about continuing metformin and I also suggested to retry Jardiance.  This was expensive for her in the past, but she feels that this may be covered now by her insurance.  Discussed about staying hydrated while on this medication. -She knows that she also needs to start improving her diet and restart exercise. - I recommended:  Patient Instructions  Please continue: - Metformin XR 500 mg in am and 1000 mg with dinner  Please add: - Jardiance 25 mg before b'fast  Please return in 3-4 months with your sugar log.   - we checked her HbA1c: 7.8% (higher) - advised to check sugars at different times of the day - 1x a day, rotating check times - advised for yearly eye exams >> she is UTD - return to clinic in 3-4 months  2. Obesity class 2 -Deviously on Ozempic which helped with weight loss, but had to come off due to abdominal pain -She was previously on the [employer] -Of note, she was seen in the [facility] in the past but she did not feel that this was a good fit for her -Since last visit, she gained 26 pounds.  We discussed that adding an SGLT2 inhibitor should help.  However she definitely needs to work on her diet and start consistent exercise.  3. HL -Reviewed latest lipid panel from 06/2021: Fractions at goal:  Lab Results  Component Value Date   CHOL 122 06/28/2021   HDL 54.80 06/28/2021   LDLCALC 58 06/28/2021   TRIG 46.0 06/28/2021   CHOLHDL 2 06/28/2021  -will continue Crestor 20 mg daily.  No side effects.  She was taking this inconsistently in the past and I advised her to take it every day.  She is due for another lipid panel.  She plans to schedule an  appointment with PCP next month.  Philemon Kingdom, MD PhD [facility]

## 2022-06-19 NOTE — Patient Instructions (Addendum)
Please continue: - Metformin XR 500 mg in am and 1000 mg with dinner  Please add: - Jardiance 25 mg before b'fast  Please return in 3-4 months with your sugar log.

## 2022-07-08 ENCOUNTER — Telehealth: Payer: 59 | Admitting: Family Medicine

## 2022-07-08 ENCOUNTER — Encounter: Payer: Self-pay | Admitting: Family Medicine

## 2022-07-08 VITALS — BP 130/74

## 2022-07-08 DIAGNOSIS — J40 Bronchitis, not specified as acute or chronic: Secondary | ICD-10-CM | POA: Diagnosis not present

## 2022-07-08 DIAGNOSIS — J324 Chronic pansinusitis: Secondary | ICD-10-CM | POA: Diagnosis not present

## 2022-07-08 DIAGNOSIS — U071 COVID-19: Secondary | ICD-10-CM

## 2022-07-08 MED ORDER — NIRMATRELVIR/RITONAVIR (PAXLOVID)TABLET: 3.0000 | ORAL_TABLET | Freq: Two times a day (BID) | ORAL | 0 refills | Status: AC

## 2022-07-08 MED ORDER — AMOXICILLIN-POT CLAVULANATE 875-125 MG PO TABS: 1.0000 | ORAL_TABLET | Freq: Two times a day (BID) | ORAL | 0 refills | Status: DC

## 2022-07-08 NOTE — Progress Notes (Signed)
MyChart Video Visit    Virtual Visit via Video Note   This visit type was conducted due to national recommendations for restrictions regarding the COVID-19 Pandemic (e.g. social distancing) in an effort to limit this patient's exposure and mitigate transmission in our community. This patient is at least at moderate risk for complications without adequate follow up. This format is felt to be most appropriate for this patient at this time. Physical exam was limited by quality of the video and audio technology used for the visit. [name] was able to get the patient set up on a video visit.  Patient location: Home Patient and provider in visit Provider location: Office  I discussed the limitations of evaluation and management by telemedicine and the availability of in person appointments. The patient expressed understanding and agreed to proceed.  Visit Date: 07/08/2022  Today's healthcare provider: [name], DO     Subjective:    Patient ID: Angela Barr, female    DOB: 1960-12-16, 61 y.o.   MRN: [id number]  Chief Complaint  Patient presents with   Covid Positive    Pt states having sore/scratchy throat, headache, and fever.     HPI Patient is in today for a video visit.   She complains of sore throat, headache, and mild cough since Sunday evening, 06/05/2022. She has mucous with her cough. She is sleeping ok at this time. She feels mild aches after sleeping for long periods so she is trying to sleep less and move more frequently. She had a fever of 101 degrees F on Sunday which resolved. She has not had a fever since. Her blood sugars are running around 171 today. She denies any wheezing. She reports taking dose of her prior Augmentin for another issue and reports mild improvements. She is also taking OTC mucinex regularly to manage her symptoms and reports mild relief. She is taking tylenol in between her mucinex doses to manage her headache.    Past Medical  History:  Diagnosis Date   Arthritis    bilateral hips   Colon polyps 2008   [place]--Dr Norwood   Diabetes mellitus 2006   on meds   Hyperlipemia 2012   on meds   Hypertension 2012   on meds   Obesity    Palpitations     Past Surgical History:  Procedure Laterality Date   BREAST BIOPSY Right 2003   COLONOSCOPY  07/07/2011   RK-F/V-suprep(exc)-TA x 1   POLYPECTOMY     TUBAL LIGATION  1990   VAGINAL HYSTERECTOMY  2006   WISDOM TOOTH EXTRACTION      Family History  Problem Relation Age of Onset   Heart failure Father    Hypertension Father    Colon cancer Father 6   Diabetes Father    Heart disease Father    Sudden death Father    Obesity Father    Colon polyps Father 57   Hypertension Mother    High Cholesterol Mother    Stroke Mother    Breast cancer Maternal Grandmother    Diabetes Paternal Grandmother    Esophageal cancer Neg Hx    Rectal cancer Neg Hx    Stomach cancer Neg Hx     Social History   Socioeconomic History   Marital status: Married    Spouse name: John   Number of children: 3   Years of education: Not on file   Highest education level: Not on file  Occupational History   Occupation:  RN  Tobacco Use   Smoking status: Never   Smokeless tobacco: Never  Vaping Use   Vaping Use: Never used  Substance and Sexual Activity   Alcohol use: No   Drug use: No   Sexual activity: Yes    Partners: Male    Birth control/protection: Surgical  Other Topics Concern   Not on file  Social History Narrative   3 caffeine drinks daily.   Exercise-no   Lives at home with husband    Social Determinants of Radio broadcast assistant Strain: Not on file  Food Insecurity: Not on file  Transportation Needs: Not on file  Physical Activity: Not on file  Stress: Not on file  Social Connections: Not on file  Intimate Partner Violence: Not on file    Outpatient Medications Prior to Visit  Medication Sig Dispense Refill   empagliflozin  (JARDIANCE) 25 MG TABS tablet Take 1 tablet (25 mg total) by mouth daily before breakfast. 90 tablet 3   fluticasone (FLONASE) 50 MCG/ACT nasal spray Place 2 sprays into both nostrils daily. 16 g 6   losartan (COZAAR) 100 MG tablet TAKE 1 TABLET BY MOUTH EVERY DAY 90 tablet 1   metFORMIN (GLUCOPHAGE-XR) 500 MG 24 hr tablet TAKE 3 TABLETS (1,500 MG TOTAL) BY MOUTH DAILY WITH BREAKFAST. 270 tablet 2   Multiple Vitamin (MULTIVITAMIN ADULT PO) Take by mouth.     ONETOUCH VERIO test strip CHECK YOUR BLOOD SUGAR TWICE DAILY 100 strip 11   OZEMPIC, 0.25 OR 0.5 MG/DOSE, 2 MG/1.5ML SOPN INJECT 0.5 MG INTO THE SKIN ONCE A WEEK 4.5 mL 3   rosuvastatin (CRESTOR) 20 MG tablet TAKE 1 TABLET BY MOUTH EVERYDAY AT BEDTIME 90 tablet 1   VITAMIN D PO Take 1 tablet by mouth daily at 6 (six) AM.     doxycycline (VIBRAMYCIN) 100 MG capsule Take 1 capsule (100 mg total) by mouth 2 (two) times daily. One po bid x 7 days (Patient not taking: Reported on 07/08/2022) 14 capsule 0   No facility-administered medications prior to visit.    Allergies  Allergen Reactions   Bactrim Nausea Only   Sulfa Antibiotics Nausea And Vomiting   Sulfamethoxazole-Trimethoprim     Review of Systems  Constitutional:  Negative for fever and malaise/fatigue.  HENT:  Positive for sore throat. Negative for congestion.   Eyes:  Negative for blurred vision.  Respiratory:  Positive for cough and sputum production. Negative for shortness of breath.   Cardiovascular:  Negative for chest pain, palpitations and leg swelling.  Gastrointestinal:  Negative for abdominal pain, blood in stool and nausea.  Genitourinary:  Negative for dysuria and frequency.  Musculoskeletal:  Negative for falls.  Skin:  Negative for rash.  Neurological:  Positive for headaches. Negative for dizziness and loss of consciousness.  Endo/Heme/Allergies:  Negative for environmental allergies.  Psychiatric/Behavioral:  Negative for depression. The patient is not  nervous/anxious.        Objective:    Physical Exam Vitals and nursing note reviewed.  Constitutional:      Appearance: Normal appearance.  Musculoskeletal:     Cervical back: Normal range of motion.  Neurological:     Mental Status: She is alert.  Psychiatric:        Mood and Affect: Mood normal.     BP 130/74  Wt Readings from Last 3 Encounters:  06/19/22 227 lb 12.8 oz (103.3 kg)  02/13/22 201 lb (91.2 kg)  01/10/22 219 lb 6.4 oz (99.5 kg)  Diabetic Foot Exam - Simple   No data filed    Lab Results  Component Value Date   WBC 6.2 02/13/2022   HGB 13.3 02/13/2022   HCT 41.3 02/13/2022   PLT 253 02/13/2022   GLUCOSE 153 (H) 02/13/2022   CHOL 122 06/28/2021   TRIG 46.0 06/28/2021   HDL 54.80 06/28/2021   LDLCALC 58 06/28/2021   ALT 27 02/13/2022   AST 17 02/13/2022   NA 136 02/13/2022   K 3.9 02/13/2022   CL 104 02/13/2022   CREATININE 0.58 02/13/2022   BUN 7 02/13/2022   CO2 24 02/13/2022   TSH 0.35 03/26/2021   HGBA1C 7.8 (A) 06/19/2022   MICROALBUR 0.8 05/14/2018    Lab Results  Component Value Date   TSH 0.35 03/26/2021   Lab Results  Component Value Date   WBC 6.2 02/13/2022   HGB 13.3 02/13/2022   HCT 41.3 02/13/2022   MCV 82.3 02/13/2022   PLT 253 02/13/2022   Lab Results  Component Value Date   NA 136 02/13/2022   K 3.9 02/13/2022   CO2 24 02/13/2022   GLUCOSE 153 (H) 02/13/2022   BUN 7 02/13/2022   CREATININE 0.58 02/13/2022   BILITOT 0.4 02/13/2022   ALKPHOS 62 02/13/2022   AST 17 02/13/2022   ALT 27 02/13/2022   PROT 6.6 02/13/2022   ALBUMIN 3.8 02/13/2022   CALCIUM 9.2 02/13/2022   ANIONGAP 8 02/13/2022   GFR 103.14 06/28/2021   Lab Results  Component Value Date   CHOL 122 06/28/2021   Lab Results  Component Value Date   HDL 54.80 06/28/2021   Lab Results  Component Value Date   LDLCALC 58 06/28/2021   Lab Results  Component Value Date   TRIG 46.0 06/28/2021   Lab Results  Component Value Date    CHOLHDL 2 06/28/2021   Lab Results  Component Value Date   HGBA1C 7.8 (A) 06/19/2022       Assessment & Plan:   Problem List Items Addressed This Visit       Unprioritized   COVID-19    paxlovid sent to pharmacy augmentin sent in  [facility] to use mucinex        Relevant Medications   nirmatrelvir/ritonavir EUA (PAXLOVID) 20 x 150 MG & 10 x '100MG'$  TABS   Other Visit Diagnoses     Bronchitis due to COVID-19 virus    -  Primary   Relevant Medications   nirmatrelvir/ritonavir EUA (PAXLOVID) 20 x 150 MG & 10 x '100MG'$  TABS   amoxicillin-clavulanate (AUGMENTIN) 875-125 MG tablet   Pansinusitis, unspecified chronicity       Relevant Medications   nirmatrelvir/ritonavir EUA (PAXLOVID) 20 x 150 MG & 10 x '100MG'$  TABS   amoxicillin-clavulanate (AUGMENTIN) 875-125 MG tablet        Meds ordered this encounter  Medications   nirmatrelvir/ritonavir EUA (PAXLOVID) 20 x 150 MG & 10 x '100MG'$  TABS    Sig: Take 3 tablets by mouth 2 (two) times daily for 5 days. (Take nirmatrelvir 150 mg two tablets twice daily for 5 days and ritonavir 100 mg one tablet twice daily for 5 days) Patient GFR is 60    Dispense:  30 tablet    Refill:  0   amoxicillin-clavulanate (AUGMENTIN) 875-125 MG tablet    Sig: Take 1 tablet by mouth 2 (two) times daily.    Dispense:  20 tablet    Refill:  0    I discussed the assessment and treatment  plan with the patient. The patient was provided an opportunity to ask questions and all were answered. The patient agreed with the plan and demonstrated an understanding of the instructions.   The patient was advised to call back or seek an in-person evaluation if the symptoms worsen or if the condition fails to improve as anticipated.  I provided 20 minutes of face-to-face time during this encounter.   [name], DO [place] [facility] [phone] (phone) [phone] (fax)  Maish Vaya    I,Shehryar  Baig,acting as a scribe for [name], DO.,have documented all relevant documentation on the behalf of [name], DO,as directed by  [name], DO while in the presence of [name], DO.

## 2022-07-08 NOTE — Assessment & Plan Note (Signed)
paxlovid sent to pharmacy augmentin sent in  [facility] to use mucinex

## 2022-09-02 ENCOUNTER — Telehealth: Payer: Self-pay

## 2022-09-02 ENCOUNTER — Other Ambulatory Visit: Payer: Self-pay | Admitting: Internal Medicine

## 2022-09-02 MED ORDER — FLUCONAZOLE 150 MG PO TABS: 150.0000 mg | ORAL_TABLET | Freq: Once | ORAL | 1 refills | Status: AC

## 2022-09-02 NOTE — Telephone Encounter (Signed)
Sent. C

## 2022-09-02 NOTE — Telephone Encounter (Signed)
Pt called requesting Diflucan rx be sent to [facility]. Pt has a yeast infection from the [place] she is currently taking.

## 2022-09-13 ENCOUNTER — Other Ambulatory Visit: Payer: Self-pay | Admitting: Internal Medicine

## 2022-09-13 DIAGNOSIS — N182 Chronic kidney disease, stage 2 (mild): Secondary | ICD-10-CM

## 2022-09-15 ENCOUNTER — Telehealth: Payer: Self-pay

## 2022-09-15 ENCOUNTER — Encounter: Payer: Self-pay | Admitting: Internal Medicine

## 2022-09-15 NOTE — Telephone Encounter (Signed)
Appt scheduled tomorrow

## 2022-09-15 NOTE — Telephone Encounter (Signed)
Caller Name [place] Phone Number [phone] Patient Name Angela Barr Patient DOB [birth year] Call Type Message Only Information Provided Reason for Call Request to Schedule Office Appointment Initial Comment Caller states she has two boils on her buttocks and needs a appointment. Patient request to speak to RN No Disp. Time Disposition Final User 09/15/2022 7:38:57 AM General Information Provided Yes Laurelyn Sickle Call Closed By: Laurelyn Sickle Transaction Date/Time: 09/15/2022 7:36:52 AM (ET)

## 2022-09-16 ENCOUNTER — Encounter: Payer: Self-pay | Admitting: Family Medicine

## 2022-09-16 ENCOUNTER — Ambulatory Visit: Payer: 59 | Admitting: Family Medicine

## 2022-09-16 VITALS — BP 136/90 | HR 99 | Temp 97.8°F | Resp 18 | Ht 64.5 in | Wt 224.0 lb

## 2022-09-16 DIAGNOSIS — L0291 Cutaneous abscess, unspecified: Secondary | ICD-10-CM | POA: Diagnosis not present

## 2022-09-16 MED ORDER — FLUCONAZOLE 150 MG PO TABS: 150.0000 mg | ORAL_TABLET | Freq: Once | ORAL | 0 refills | Status: AC

## 2022-09-16 MED ORDER — DOXYCYCLINE HYCLATE 100 MG PO TABS: 100.0000 mg | ORAL_TABLET | Freq: Two times a day (BID) | ORAL | 0 refills | Status: AC

## 2022-09-16 NOTE — Patient Instructions (Addendum)
Warm compresses  Start antibiotics - take with food and probiotics  Keep area clean and dry Monitor for any new or worsening symptoms Follow-up with Dr. Etter Sjogren in 1 week

## 2022-09-16 NOTE — Progress Notes (Signed)
Acute Office Visit  Subjective:     Patient ID: Angela Barr, female    DOB: 1961/05/14, 61 y.o.   MRN: [id number]  Chief Complaint  Patient presents with   Abcess    Pt states noticing pain first about 11 days ago. Pt states sxs started after having yeast infection. No discharge     HPI Patient is in today for possible abscess.  Patient reports that recently had a bad yeast infection to her groin region (on Jardiance) and reports she was feeling raw from the irritation. Towards the end of last week she started noticing some soreness in her perineal/buttocks region when sitting. States it was sore as if she has been riding on a horse. This weekend she decided to try to feel that area and noticed 2 abscess like areas that are painful to touch. She has not noticed any open lesions, drainage, spreading edema, or warmth; no fevers, chills, malaise, body aches.     ROS All review of systems negative except what is listed in the HPI      Objective:    BP (!) 136/90 (BP Location: Left Arm, Patient Position: Sitting, Cuff Size: Normal)   Pulse 99   Temp 97.8 F (36.6 C) (Oral)   Resp 18   Ht 5' 4.5" (1.638 m)   Wt 224 lb (101.6 kg)   SpO2 99%   BMI 37.86 kg/m    Physical Exam Vitals reviewed.  Constitutional:      Appearance: Normal appearance.  Genitourinary:   Skin:    General: Skin is warm and dry.     Findings: No erythema.  Neurological:     Mental Status: She is alert and oriented to person, place, and time.  Psychiatric:        Mood and Affect: Mood normal.        Behavior: Behavior normal.        Thought Content: Thought content normal.        Judgment: Judgment normal.         No results found for any visits on 09/16/22.      Assessment & Plan:   Problem List Items Addressed This Visit   None Visit Diagnoses     Abscess    -  Primary Warm compresses  Start antibiotics - take with food and probiotics  Keep area clean and dry Monitor  for any new or worsening symptoms Adding diflucan in case ABX trigger yeast Follow-up with Dr. Etter Sjogren in 1 week     Relevant Medications   fluconazole (DIFLUCAN) 150 MG tablet   doxycycline (VIBRA-TABS) 100 MG tablet       Meds ordered this encounter  Medications   fluconazole (DIFLUCAN) 150 MG tablet    Sig: Take 1 tablet (150 mg total) by mouth once for 1 dose. Repeat dose in 72 hours if not improved.    Dispense:  2 tablet    Refill:  0    Order Specific Question:   Supervising Provider    Answer:   Penni Homans A [[id number]]   doxycycline (VIBRA-TABS) 100 MG tablet    Sig: Take 1 tablet (100 mg total) by mouth 2 (two) times daily for 7 days.    Dispense:  14 tablet    Refill:  0    Order Specific Question:   Supervising Provider    Answer:   Penni Homans A [[id number]]    Return in about 1 week (around 09/23/2022), or if symptoms worsen  or fail to improve.  Terrilyn Saver, NP

## 2022-09-30 ENCOUNTER — Other Ambulatory Visit: Payer: Self-pay | Admitting: Family Medicine

## 2022-09-30 DIAGNOSIS — I10 Essential (primary) hypertension: Secondary | ICD-10-CM

## 2022-09-30 DIAGNOSIS — Z0000 Encounter for general adult medical examination without abnormal findings: Secondary | ICD-10-CM

## 2022-10-06 ENCOUNTER — Other Ambulatory Visit: Payer: Self-pay | Admitting: Internal Medicine

## 2022-10-06 MED ORDER — VICTOZA 18 MG/3ML ~~LOC~~ SOPN: 1.8000 mg | PEN_INJECTOR | Freq: Every day | SUBCUTANEOUS | 3 refills | Status: DC

## 2022-10-06 MED ORDER — INSULIN PEN NEEDLE 32G X 4 MM MISC: 3 refills | Status: DC

## 2022-10-22 ENCOUNTER — Encounter: Payer: Self-pay | Admitting: Internal Medicine

## 2022-10-22 ENCOUNTER — Ambulatory Visit: Payer: 59 | Admitting: Internal Medicine

## 2022-10-22 VITALS — BP 122/82 | HR 82 | Ht 64.5 in | Wt 228.2 lb

## 2022-10-22 DIAGNOSIS — E669 Obesity, unspecified: Secondary | ICD-10-CM | POA: Diagnosis not present

## 2022-10-22 DIAGNOSIS — E1165 Type 2 diabetes mellitus with hyperglycemia: Secondary | ICD-10-CM | POA: Diagnosis not present

## 2022-10-22 DIAGNOSIS — E785 Hyperlipidemia, unspecified: Secondary | ICD-10-CM | POA: Diagnosis not present

## 2022-10-22 LAB — POCT GLYCOSYLATED HEMOGLOBIN (HGB A1C): Hemoglobin A1C: 7.6 % — AB (ref 4.0–5.6)

## 2022-10-22 NOTE — Progress Notes (Signed)
Patient ID: Angela Barr, female   DOB: 02/08/1961, 61 y.o.   MRN: [id number]  HPI: Angela Barr is a 61 y.o.-year-old female, returning for f/u for DM2, dx in 2007, non-insulin-dependent, uncontrolled, without long term complications, but with hyperglycemia. Last visit 4 months ago.  Interim history: No increased urination, blurry vision, nausea, chest pain. Before last visit, she relaxed her diet and did not exercise her sugars were higher. Also, after starting on Jardiance, she developed 3 pelvic abscesses.  She had to be on antibiotics and had to stop Jardiance.  Reviewed HbA1c levels: Lab Results  Component Value Date   HGBA1C 7.8 (A) 06/19/2022   HGBA1C 6.4 (A) 01/29/2021   HGBA1C 7.0 (A) 09/25/2020   HGBA1C 6.1 (A) 05/15/2020   HGBA1C 7.1 (A) 01/10/2020   HGBA1C 6.4 (A) 09/12/2019   HGBA1C 6.7 (A) 05/12/2019   HGBA1C 7.2 (A) 01/19/2019   HGBA1C 6.6 (A) 09/17/2018   HGBA1C 6.4 (A) 05/14/2018   HGBA1C 7.3 02/08/2018   HGBA1C 6.6 11/11/2017   HGBA1C 6.6 08/11/2017   HGBA1C 6.6 04/01/2017   HGBA1C 6.3 11/14/2016   HGBA1C 6.6 08/15/2016   HGBA1C 6.4 05/15/2016   HGBA1C 7.5 (H) 02/05/2016   HGBA1C 7.1 09/25/2015   HGBA1C 7.7 06/25/2015  09/25/2015: HbA1c 7.1%  She is now on: - Metformin XR 500 mg with lunch and 1000 mg with dinner >> 1500 mg with dinner >> 500 mg in am and 1000 mg with dinner -  - started 05/2022 >> pelvic abscesses >> had to be an ABx >> stopped Jardiance - Victoza started 2 weeks ago: 0.6 >> 1.2 mg in am  Stop glimepiride 2 mg before dinner 08/2020 Stopped Jardiance due to price. Invokana caused yeast infections. Stopped Trulicity 1.5 mg >> stopped b/c Nausea and HAs, and also expensive - now tolerating it well, with mild nausea. She was Bydureon >> lumps at inj. Site.  She was on Victoza, now too expensive. Previously on: Ozempic 0.5 mg weekly >> restarted 08/2020 >> 1 mg weekly >> stopped 01/2022 due to abdominal pain.  She is checking sugars  0-1x a day: - am:  119, 149, 166 >> 118-140 >> 150-199 >> 125-164 - 2h after b'fast: 191 >> n/c >> 189 >> n/c - before lunch:  102 >> 120-130 >> n/c >> 228 >> 140 - 2h after lunch: 144-220, 270 >> n/c >> 101-116 >> n/c  - before dinner:  116 >> n/c >> 85, 98 >> n/c - 2h after dinner:  178 >> n/c >> 89 >> n/c - bedtime:  156 >> 102 >> 149 >> n/c >> 169, 180 >> 123 - nighttime: n/c >> 70s occas. >> 129 >> n/c Lowest sugar was 127 >> 119 >> 118 >> 150 >> 123; she has hypoglycemia awareness in the 90s. Highest sugar was 189 >> 140s >> 228>> 164.  Glucometer: One Touch Verio Flex   Eats sunflower seeds and nuts.  She stopped sodas but changed to sweet tea in the past.  I advised her to also stop this >> now only diet drinks.  No CKD; Last BUN/creatinine:  Lab Results  Component Value Date   BUN 7 02/13/2022   CREATININE 0.58 02/13/2022   Normal ACR levels: Lab Results  Component Value Date   MICRALBCREAT 0.6 05/14/2018   MICRALBCREAT 0.7 02/05/2016   MICRALBCREAT 0.7 12/25/2014   MICRALBCREAT 2.0 06/13/2014   MICRALBCREAT 0.7 04/04/2013   MICRALBCREAT 0.5 01/30/2012   MICRALBCREAT 0.3 06/17/2011  We stopped lisinopril as  she had low blood pressure and dizziness in the past. On losartan.  + HL; last set of lipids: Lab Results  Component Value Date   CHOL 122 06/28/2021   HDL 54.80 06/28/2021   LDLCALC 58 06/28/2021   TRIG 46.0 06/28/2021   CHOLHDL 2 06/28/2021  On Crestor 20 daily.  - last eye exam was in 2023: No DR reportedly. [facility].  -She denies numbness and tingling in her feet.  Last foot exam 05/2022.  ROS: + see HPI  Past Medical History:  Diagnosis Date   Arthritis    bilateral hips   Colon polyps 2008   [place]--Dr Norwood   Diabetes mellitus 2006   on meds   Hyperlipemia 2012   on meds   Hypertension 2012   on meds   Obesity    Palpitations    Past Surgical History:  Procedure Laterality Date   BREAST BIOPSY Right 2003   COLONOSCOPY   07/07/2011   RK-F/V-suprep(exc)-TA x 1   POLYPECTOMY     TUBAL LIGATION  1990   VAGINAL HYSTERECTOMY  2006   WISDOM TOOTH EXTRACTION     History   Social History   Marital Status: Married    Spouse Name: N/A   Number of Children: 3   Occupational History   RN    Social History Main Topics   Smoking status: Never Smoker    Smokeless tobacco: Never Used   Alcohol Use: No   Drug Use: No   Social History Narrative   3 caffeine drinks daily    Current Outpatient Medications on File Prior to Visit  Medication Sig Dispense Refill   Insulin Pen Needle 32G X 4 MM MISC Use 1x a day 100 each 3   liraglutide (VICTOZA) 18 MG/3ML SOPN Inject 1.8 mg into the skin daily. 9 mL 3   empagliflozin (JARDIANCE) 25 MG TABS tablet Take 1 tablet (25 mg total) by mouth daily before breakfast. 90 tablet 3   fluticasone (FLONASE) 50 MCG/ACT nasal spray Place 2 sprays into both nostrils daily. 16 g 6   losartan (COZAAR) 100 MG tablet TAKE 1 TABLET BY MOUTH EVERY DAY 90 tablet 1   metFORMIN (GLUCOPHAGE-XR) 500 MG 24 hr tablet TAKE 3 TABLETS (1,500 MG TOTAL) BY MOUTH DAILY WITH BREAKFAST 270 tablet 3   Multiple Vitamin (MULTIVITAMIN ADULT PO) Take by mouth.     ONETOUCH VERIO test strip CHECK YOUR BLOOD SUGAR TWICE DAILY 100 strip 11   rosuvastatin (CRESTOR) 20 MG tablet TAKE 1 TABLET BY MOUTH EVERYDAY AT BEDTIME 90 tablet 1   VITAMIN D PO Take 1 tablet by mouth daily at 6 (six) AM.     No current facility-administered medications on file prior to visit.   Allergies  Allergen Reactions   Bactrim Nausea Only   Sulfa Antibiotics Nausea And Vomiting   Sulfamethoxazole-Trimethoprim    Family History  Problem Relation Age of Onset   Heart failure Father    Hypertension Father    Colon cancer Father 82   Diabetes Father    Heart disease Father    Sudden death Father    Obesity Father    Colon polyps Father 29   Hypertension Mother    High Cholesterol Mother    Stroke Mother    Breast cancer  Maternal Grandmother    Diabetes Paternal Grandmother    Esophageal cancer Neg Hx    Rectal cancer Neg Hx    Stomach cancer Neg Hx  PE: BP 122/82 (BP Location: Right Arm, Patient Position: Sitting, Cuff Size: Normal)   Pulse 82   Ht 5' 4.5" (1.638 m)   Wt 228 lb 3.2 oz (103.5 kg)   SpO2 99%   BMI 38.57 kg/m   Wt Readings from Last 3 Encounters:  10/22/22 228 lb 3.2 oz (103.5 kg)  09/16/22 224 lb (101.6 kg)  06/19/22 227 lb 12.8 oz (103.3 kg)   Constitutional: overweight, in NAD Eyes: no exophthalmos ENT: no masses palpated in neck, no cervical lymphadenopathy Cardiovascular: RRR, No MRG Respiratory: CTA B Musculoskeletal: no deformities Skin: no rashes Neurological: no tremor with outstretched hands  ASSESSMENT: 1. DM2, non-insulin-dependent, uncontrolled, without long term complications, but with hyperglycemia  2. Obesity class 2 BMI Classification: < 18.5 underweight  18.5-24.9 normal weight  25.0-29.9 overweight  30.0-34.9 class I obesity  35.0-39.9 class II obesity  ? 40.0 class III obesity   3. HL  PLAN:  1. Patient with longstanding, uncontrolled, type 2 diabetes, on metformin and SGLT2 inhibitor.  She did well on a GLP-1 receptor agonist, losing weight and with an HbA1c of 6.4% our last in person visit in 01/2021.  We had a virtual visit in 05/2021 and her blood sugars were still controlled at that time.  Since last visit, she had to come off Ozempic 2/2 AP.  She retried this at the lower dose but developed the same symptoms and stopped completely.  Reviewing her chart, she had a normal lipase in 01/2022. -At last visit, she was not checking sugars consistently and I advised her to start and given sugar logs.  I also recommended to add Jardiance.  She was able to start.  However, she developed a pelvic abscesses and had to be on antibiotics for these.  She came off Jardiance and approximately 2 weeks ago, she started the dose at the lower dose and increased to the  mid dose, 1.2 mg daily recently.  We discussed about changing the dose up to 1.8 mg daily.  She has some diarrhea but she is not sure whether this comes from metformin, Diflucan, or Victoza.  We discussed about splitting metformin between the meals and the fact that she can expect more diarrhea if she takes it with a fattier meal.  I also advised her to increase Victoza slowly, by 1-2 clicks a day.  She feels that she may still have vaginitis symptoms.  We discussed about starting a probiotic or eating yogurt. - I recommended:  Patient Instructions  Please continue: - Metformin XR 500 mg in am and 1000 mg with dinner  Try to increase:   - Victoza 1.8 mg daily in am  Please return in 3 months with your sugar log.   - we checked her HbA1c: 7.6% (slightly lower) - advised to check sugars at different times of the day - 1x a day, rotating check times - advised for yearly eye exams >> she is UTD - return to clinic in 3 months  2. Obesity class 2 -Previously on Ozempic, which helped with weight loss but had to come off due to abdominal pain. -She was previously on the [employer] -Of note, she was seen in the [facility] in the past but she did not feel that this was a good fit for her -Before last visit, she gained 26 pounds.  I recommended to work on diet and start consistent exercise but I also suggested to start an SGLT2 inhibitor to further help with weight loss.  She was able to start Jardiance 25 mg daily. -she gained 1 pound since last OV  3. HL -Reviewed latest lipid panel from 06/2021: Fractions at goal Lab Results  Component Value Date   CHOL 122 06/28/2021   HDL 54.80 06/28/2021   LDLCALC 58 06/28/2021   TRIG 46.0 06/28/2021   CHOLHDL 2 06/28/2021  -she is on Crestor 20 mg daily without side effects.  At last visit, she was not taking this consistently and I advised her to take it every day. -She is due for another lipid panel -she wants to schedule  appointment with PCP and have this done at the same time.  Philemon Kingdom, MD PhD [facility]

## 2022-10-22 NOTE — Patient Instructions (Addendum)
Please continue: - Metformin XR 500 mg in am and 1000 mg with dinner  Try to increase:   - Victoza 1.8 mg daily in am  Please return in 3 months with your sugar log.

## 2022-11-17 ENCOUNTER — Encounter: Payer: Self-pay | Admitting: Internal Medicine

## 2022-11-18 ENCOUNTER — Other Ambulatory Visit: Payer: Self-pay | Admitting: Internal Medicine

## 2022-11-18 MED ORDER — RYBELSUS 7 MG PO TABS: 7.0000 mg | ORAL_TABLET | Freq: Every day | ORAL | 5 refills | Status: DC

## 2022-12-24 ENCOUNTER — Encounter: Payer: 59 | Admitting: Medical

## 2022-12-26 ENCOUNTER — Encounter: Payer: 59 | Admitting: Family

## 2023-01-16 ENCOUNTER — Ambulatory Visit (INDEPENDENT_AMBULATORY_CARE_PROVIDER_SITE_OTHER): Payer: 59 | Admitting: Family

## 2023-01-16 ENCOUNTER — Other Ambulatory Visit (INDEPENDENT_AMBULATORY_CARE_PROVIDER_SITE_OTHER): Payer: 59

## 2023-01-16 ENCOUNTER — Other Ambulatory Visit: Payer: Self-pay | Admitting: Family Medicine

## 2023-01-16 ENCOUNTER — Encounter: Payer: Self-pay | Admitting: Family

## 2023-01-16 VITALS — BP 122/74 | HR 90 | Resp 18 | Ht 64.5 in | Wt 232.6 lb

## 2023-01-16 DIAGNOSIS — Z0184 Encounter for antibody response examination: Secondary | ICD-10-CM | POA: Diagnosis not present

## 2023-01-16 DIAGNOSIS — Z0000 Encounter for general adult medical examination without abnormal findings: Secondary | ICD-10-CM

## 2023-01-16 DIAGNOSIS — E1165 Type 2 diabetes mellitus with hyperglycemia: Secondary | ICD-10-CM

## 2023-01-16 DIAGNOSIS — E785 Hyperlipidemia, unspecified: Secondary | ICD-10-CM

## 2023-01-16 DIAGNOSIS — I10 Essential (primary) hypertension: Secondary | ICD-10-CM

## 2023-01-16 LAB — HEMOGLOBIN A1C: Hgb A1c MFr Bld: 8.9 % — ABNORMAL HIGH (ref 4.6–6.5)

## 2023-01-16 LAB — COMPREHENSIVE METABOLIC PANEL
ALT: 41 U/L — ABNORMAL HIGH (ref 0–35)
AST: 24 U/L (ref 0–37)
Albumin: 4 g/dL (ref 3.5–5.2)
Alkaline Phosphatase: 101 U/L (ref 39–117)
BUN: 10 mg/dL (ref 6–23)
CO2: 24 mEq/L (ref 19–32)
Calcium: 8.9 mg/dL (ref 8.4–10.5)
Chloride: 106 mEq/L (ref 96–112)
Creatinine, Ser: 0.57 mg/dL (ref 0.40–1.20)
GFR: 98.85 mL/min (ref >60.00)
Glucose, Bld: 209 mg/dL — ABNORMAL HIGH (ref 70–99)
Potassium: 3.9 mEq/L (ref 3.5–5.1)
Sodium: 138 mEq/L (ref 135–145)
Total Bilirubin: 0.2 mg/dL (ref 0.2–1.2)
Total Protein: 6.4 g/dL (ref 6.0–8.3)

## 2023-01-16 LAB — CBC WITH DIFFERENTIAL/PLATELET
Basophils Absolute: 0 10*3/uL (ref 0.0–0.1)
Basophils Relative: 0.6 % (ref 0.0–3.0)
Eosinophils Absolute: 0.1 10*3/uL (ref 0.0–0.7)
Eosinophils Relative: 1.8 % (ref 0.0–5.0)
HCT: 40.3 % (ref 36.0–46.0)
Hemoglobin: 12.9 g/dL (ref 12.0–15.0)
Lymphocytes Relative: 33 % (ref 12.0–46.0)
Lymphs Abs: 1.2 10*3/uL (ref 0.7–4.0)
MCHC: 32 g/dL (ref 30.0–36.0)
MCV: 80.9 fl (ref 78.0–100.0)
Monocytes Absolute: 0.3 10*3/uL (ref 0.1–1.0)
Monocytes Relative: 8.1 % (ref 3.0–12.0)
Neutro Abs: 2 10*3/uL (ref 1.4–7.7)
Neutrophils Relative %: 56.5 % (ref 43.0–77.0)
Platelets: 217 10*3/uL (ref 150.0–400.0)
RBC: 4.98 Mil/uL (ref 3.87–5.11)
RDW: 16 % — ABNORMAL HIGH (ref 11.5–15.5)
WBC: 3.6 10*3/uL — ABNORMAL LOW (ref 4.0–10.5)

## 2023-01-16 LAB — MICROALBUMIN / CREATININE URINE RATIO
Creatinine,U: 180.6 mg/dL
Microalb Creat Ratio: 1.6 mg/g (ref 0.0–30.0)
Microalb, Ur: 3 mg/dL — ABNORMAL HIGH (ref 0.0–1.9)

## 2023-01-16 LAB — LIPID PANEL
Cholesterol: 156 mg/dL (ref 0–200)
HDL: 46.3 mg/dL (ref >39.00)
LDL Cholesterol: 93 mg/dL (ref 0–99)
NonHDL: 109.63
Total CHOL/HDL Ratio: 3
Triglycerides: 81 mg/dL (ref 0.0–149.0)
VLDL: 16.2 mg/dL (ref 0.0–40.0)

## 2023-01-16 NOTE — Addendum Note (Signed)
Addended by: Ronny Flurry on: 01/16/2023 09:43 AM   Modules accepted: Orders

## 2023-01-16 NOTE — Progress Notes (Signed)
No charge today. Pt seen by another Provider this morning and charges were made in that encounter.

## 2023-01-16 NOTE — Progress Notes (Signed)
Angela Barr is a 62 y.o. female with the following history as recorded in [employer]:  Patient Active Problem List   Diagnosis Date Noted   Vaginal sore 01/10/2022   COVID-19 05/28/2021   Class 2 severe obesity with serious comorbidity and body mass index (BMI) of 38.0 to 38.9 in adult [facility]) 03/26/2021   Preventative health care 03/26/2021   Low back pain 03/17/2020   Leg pain, medial 03/17/2020   Dysuria 04/06/2019   Dizzy 11/01/2018   Displaced fracture of fifth metatarsal bone of right foot 03/05/2018   Nasal sore 02/24/2017   DM (diabetes mellitus) type II uncontrolled, periph vascular disorder 03/20/2016   Norovirus 11/04/2015   Cellulitis and abscess of trunk 05/10/2015   Vaginitis and vulvovaginitis 03/19/2015   Serous otitis media 06/30/2013   Class 2 obesity 06/25/2013   Hyperlipidemia 06/25/2013   Hemorrhage of rectum and anus 07/04/2011   Personal history of colonic polyps 07/04/2011   Family history of malignant neoplasm of gastrointestinal tract 07/04/2011   Essential hypertension 06/17/2011    Current Outpatient Medications  Medication Sig Dispense Refill   empagliflozin (JARDIANCE) 25 MG TABS tablet Take 1 tablet (25 mg total) by mouth daily before breakfast. 90 tablet 3   fluticasone (FLONASE) 50 MCG/ACT nasal spray Place 2 sprays into both nostrils daily. 16 g 6   Insulin Pen Needle 32G X 4 MM MISC Use 1x a day 100 each 3   liraglutide (VICTOZA) 18 MG/3ML SOPN Inject 1.8 mg into the skin daily. 9 mL 3   losartan (COZAAR) 100 MG tablet TAKE 1 TABLET BY MOUTH EVERY DAY 90 tablet 1   metFORMIN (GLUCOPHAGE-XR) 500 MG 24 hr tablet TAKE 3 TABLETS (1,500 MG TOTAL) BY MOUTH DAILY WITH BREAKFAST 270 tablet 3   Multiple Vitamin (MULTIVITAMIN ADULT PO) Take by mouth.     ONETOUCH VERIO test strip CHECK YOUR BLOOD SUGAR TWICE DAILY 100 strip 11   rosuvastatin (CRESTOR) 20 MG tablet TAKE 1 TABLET BY MOUTH EVERYDAY AT BEDTIME 90 tablet 1   Semaglutide (RYBELSUS) 7 MG TABS  Take 1 tablet (7 mg total) by mouth daily. 30 tablet 5   VITAMIN D PO Take 1 tablet by mouth daily at 6 (six) AM.     No current facility-administered medications for this visit.    Allergies: Bactrim, Sulfa antibiotics, and Sulfamethoxazole-trimethoprim  Past Medical History:  Diagnosis Date   Arthritis    bilateral hips   Colon polyps 2008   [place]--Dr Norwood   Diabetes mellitus 2006   on meds   Hyperlipemia 2012   on meds   Hypertension 2012   on meds   Obesity    Palpitations     Past Surgical History:  Procedure Laterality Date   BREAST BIOPSY Right 2003   COLONOSCOPY  07/07/2011   RK-F/V-suprep(exc)-TA x 1   POLYPECTOMY     TUBAL LIGATION  1990   VAGINAL HYSTERECTOMY  2006   WISDOM TOOTH EXTRACTION      Family History  Problem Relation Age of Onset   Heart failure Father    Hypertension Father    Colon cancer Father 58   Diabetes Father    Heart disease Father    Sudden death Father    Obesity Father    Colon polyps Father 7   Hypertension Mother    High Cholesterol Mother    Stroke Mother    Breast cancer Maternal Grandmother    Diabetes Paternal Grandmother    Esophageal cancer Neg Hx  Rectal cancer Neg Hx    Stomach cancer Neg Hx     Social History   Tobacco Use   Smoking status: Never   Smokeless tobacco: Never  Substance Use Topics   Alcohol use: No    Subjective:   Requesting titers to be updated for DNP program; requesting to have titers to checked for MMR/ Varicella and Hepatitis B;  Also requesting to have TB Gold updated today- last one done in 02/2021;  Tdap 11/15/2019;  Does not have any documentation available for review today for what is needed for her school requirements; Does see her endocrinologist regularly for management of her Type 2 Diabetes;    Objective:  Vitals:   01/16/23 0859  BP: 122/74  Pulse: 90  Resp: 18  SpO2: 99%  Weight: 232 lb 9.6 oz (105.5 kg)  Height: 5' 4.5" (1.638 m)    General: Well  developed, well nourished, in no acute distress  Skin : Warm and dry.  Head: Normocephalic and atraumatic  Lungs: Respirations unlabored;  Neurologic: Alert and oriented; speech intact; face symmetrical; moves all extremities well; CNII-XII intact without focal deficit   Assessment:  1. Immunity status testing     Plan:  Will update titers today as requested for MMR, Varicella, Hepatitis B and TB Gold;  She will need to follow up with her PCP if there are continued concerns with her program requiring her to get vaccines that she is prefers to avoid.  There was confusion as to the nature of this appointment- I explained to patient that she would need to see her PCP to discuss her preventive care management and that today's appointment had been scheduled in order to complete her school requirements.   No follow-ups on file.  Orders Placed This Encounter  Procedures   Measles/Mumps/Rubella Immunity   Varicella-Zoster Virus Antibody (Immunity Screen), ACIF ,Serum   QuantiFERON-TB Gold Plus   Hepatitis B surface antibody,qualitative    Requested Prescriptions    No prescriptions requested or ordered in this encounter

## 2023-01-19 ENCOUNTER — Ambulatory Visit (INDEPENDENT_AMBULATORY_CARE_PROVIDER_SITE_OTHER): Payer: 59 | Admitting: Family Medicine

## 2023-01-19 ENCOUNTER — Encounter: Payer: Self-pay | Admitting: Family Medicine

## 2023-01-19 VITALS — BP 140/90 | HR 79 | Temp 98.1°F | Resp 18 | Ht 64.5 in | Wt 232.4 lb

## 2023-01-19 DIAGNOSIS — E1169 Type 2 diabetes mellitus with other specified complication: Secondary | ICD-10-CM

## 2023-01-19 DIAGNOSIS — Z8616 Personal history of COVID-19: Secondary | ICD-10-CM

## 2023-01-19 DIAGNOSIS — Z0000 Encounter for general adult medical examination without abnormal findings: Secondary | ICD-10-CM | POA: Diagnosis not present

## 2023-01-19 DIAGNOSIS — E1165 Type 2 diabetes mellitus with hyperglycemia: Secondary | ICD-10-CM

## 2023-01-19 DIAGNOSIS — E785 Hyperlipidemia, unspecified: Secondary | ICD-10-CM

## 2023-01-19 DIAGNOSIS — M25551 Pain in right hip: Secondary | ICD-10-CM

## 2023-01-19 MED ORDER — ROSUVASTATIN CALCIUM 20 MG PO TABS: ORAL_TABLET | ORAL | 3 refills | Status: AC

## 2023-01-19 MED ORDER — RYBELSUS 14 MG PO TABS: 14.0000 mg | ORAL_TABLET | Freq: Every day | ORAL | 3 refills | Status: DC

## 2023-01-19 NOTE — Progress Notes (Signed)
Subjective:   By signing my name below, I, Angela Barr, attest that this documentation has been prepared under the direction and in the presence of Angela Held, DO. 01/19/2023   Patient ID: Angela Barr, female    DOB: 03/06/1961, 62 y.o.   MRN: [id number]  Chief Complaint  Patient presents with   Annual Exam    Pt states not fasting but did labs last week.    HPI Patient is in today for a comprehensive physical exam.   Acute  She denies any joint pain besides in her right hip. She denies any new moles.   Diabetes  Her A1C levels are elevated. Her blood sugar was 188 this morning. She is taking 1500 mg metformin daily PO. She is not taking 25 mg Jardiance.  Lab Results  Component Value Date   HGBA1C 8.9 (H) 01/16/2023     Blood pressure Her blood pressure is elevated during this visit. Her blood pressure three days prior was stable. She is taking 100 mg losartan daily PO to manage it. BP Readings from Last 3 Encounters:  01/19/23 (!) 140/90  01/16/23 122/74  10/22/22 122/82    Right Hip pain She complains of pain in her right hip that has gotten worse recently. She is interested in receiving a referral for the right hip pain during this visit.  Vitamins  She is taking Calcium. She is not taking Vitamin D.   Gynecology  She visits Dr. Nyoka Cowden for gynecology and is UTD.   Colonoscopy Last completed on 07/07/2011. 2-3 mm polyp found in the cecum, otherwise findings are normal. Repeat in 5 years.   Mammogram Last completed on 04/04/2021. Results are normal. Repeat in 1 year.   Social history Her sister, grandma, and aunt have a history of hip replacements.   Immunizations She is UTD on the Tdap immunization. She has not received the latest Covid-19 immunization. She has had allergic reactions to immunizations in the past where her lymph nodes swell and she is unable to lift her arms.   Vision She visited the eye doctor around a month ago. She had redness  and dryness in her sclera. Her vision is 20/20 with glasses.   Past Medical History:  Diagnosis Date   Arthritis    bilateral hips   Colon polyps 2008   [place]--Dr Norwood   Diabetes mellitus 2006   on meds   Hyperlipemia 2012   on meds   Hypertension 2012   on meds   Obesity    Palpitations     Past Surgical History:  Procedure Laterality Date   BREAST BIOPSY Right 2003   COLONOSCOPY  07/07/2011   RK-F/V-suprep(exc)-TA x 1   POLYPECTOMY     TUBAL LIGATION  1990   VAGINAL HYSTERECTOMY  2006   WISDOM TOOTH EXTRACTION      Family History  Problem Relation Age of Onset   Heart failure Father    Hypertension Father    Colon cancer Father 72   Diabetes Father    Heart disease Father    Sudden death Father    Obesity Father    Colon polyps Father 67   Hypertension Mother    High Cholesterol Mother    Stroke Mother    Breast cancer Maternal Grandmother    Diabetes Paternal Grandmother    Esophageal cancer Neg Hx    Rectal cancer Neg Hx    Stomach cancer Neg Hx     Social History  Socioeconomic History   Marital status: Married    Spouse name: John   Number of children: 3   Years of education: Not on file   Highest education level: Not on file  Occupational History   Occupation: RN  Tobacco Use   Smoking status: Never   Smokeless tobacco: Never  Vaping Use   Vaping Use: Never used  Substance and Sexual Activity   Alcohol use: No   Drug use: No   Sexual activity: Yes    Partners: Male    Birth control/protection: Surgical  Other Topics Concern   Not on file  Social History Narrative   3 caffeine drinks daily.   Exercise-no   Lives at home with husband    Social Determinants of Radio broadcast assistant Strain: Not on file  Food Insecurity: Not on file  Transportation Needs: Not on file  Physical Activity: Not on file  Stress: Not on file  Social Connections: Not on file  Intimate Partner Violence: Not on file    Outpatient  Medications Prior to Visit  Medication Sig Dispense Refill   fluticasone (FLONASE) 50 MCG/ACT nasal spray Place 2 sprays into both nostrils daily. 16 g 6   losartan (COZAAR) 100 MG tablet TAKE 1 TABLET BY MOUTH EVERY DAY 90 tablet 1   metFORMIN (GLUCOPHAGE-XR) 500 MG 24 hr tablet TAKE 3 TABLETS (1,500 MG TOTAL) BY MOUTH DAILY WITH BREAKFAST 270 tablet 3   Multiple Vitamin (MULTIVITAMIN ADULT PO) Take by mouth.     ONETOUCH VERIO test strip CHECK YOUR BLOOD SUGAR TWICE DAILY 100 strip 11   VITAMIN D PO Take 1 tablet by mouth daily at 6 (six) AM.     rosuvastatin (CRESTOR) 20 MG tablet TAKE 1 TABLET BY MOUTH EVERYDAY AT BEDTIME 90 tablet 1   Semaglutide (RYBELSUS) 7 MG TABS Take 1 tablet (7 mg total) by mouth daily. 30 tablet 5   empagliflozin (JARDIANCE) 25 MG TABS tablet Take 1 tablet (25 mg total) by mouth daily before breakfast. (Patient not taking: Reported on 01/19/2023) 90 tablet 3   No facility-administered medications prior to visit.    Allergies  Allergen Reactions   Bactrim Nausea Only   Sulfa Antibiotics Nausea And Vomiting   Sulfamethoxazole-Trimethoprim     Review of Systems  Constitutional:  Negative for fever and malaise/fatigue.  HENT:  Negative for congestion.   Eyes:  Negative for blurred vision.  Respiratory:  Negative for shortness of breath.   Cardiovascular:  Negative for chest pain, palpitations and leg swelling.  Gastrointestinal:  Negative for abdominal pain, blood in stool and nausea.  Genitourinary:  Negative for dysuria and frequency.  Musculoskeletal:  Positive for joint pain (right hip). Negative for falls.  Skin:  Negative for rash.       (-) new moles  Neurological:  Negative for dizziness, loss of consciousness and headaches.  Endo/Heme/Allergies:  Negative for environmental allergies.  Psychiatric/Behavioral:  Negative for depression. The patient is not nervous/anxious.        Objective:    Physical Exam Vitals and nursing note reviewed.   Constitutional:      General: She is not in acute distress.    Appearance: Normal appearance.  HENT:     Head: Normocephalic and atraumatic.     Right Ear: Tympanic membrane, ear canal and external ear normal.     Left Ear: Tympanic membrane, ear canal and external ear normal.  Eyes:     Extraocular Movements: Extraocular movements intact.  Pupils: Pupils are equal, round, and reactive to light.  Cardiovascular:     Rate and Rhythm: Normal rate and regular rhythm.     Heart sounds: Normal heart sounds. No murmur heard.    No gallop.  Pulmonary:     Effort: Pulmonary effort is normal. No respiratory distress.     Breath sounds: Normal breath sounds. No wheezing or rales.  Skin:    General: Skin is warm.  Neurological:     Mental Status: She is alert and oriented to person, place, and time.  Psychiatric:        Judgment: Judgment normal.     BP (!) 140/90 (BP Location: Left Arm, Patient Position: Sitting, Cuff Size: Normal)   Pulse 79   Temp 98.1 F (36.7 C) (Oral)   Resp 18   Ht 5' 4.5" (1.638 m)   Wt 232 lb 6.4 oz (105.4 kg)   SpO2 99%   BMI 39.28 kg/m  Wt Readings from Last 3 Encounters:  01/19/23 232 lb 6.4 oz (105.4 kg)  01/16/23 232 lb 9.6 oz (105.5 kg)  10/22/22 228 lb 3.2 oz (103.5 kg)       Assessment & Plan:  Preventative health care -     Rybelsus; Take 1 tablet (14 mg total) by mouth daily.  Dispense: 90 tablet; Refill: 3  Hyperlipidemia associated with type 2 diabetes mellitus (HCC) -     Rosuvastatin Calcium; TAKE 1 TABLET BY MOUTH EVERYDAY AT BEDTIME  Dispense: 90 tablet; Refill: 3  History of 2019 novel coronavirus disease (COVID-19) -     SARS-CoV-2 Semi-Quantitative Total Antibody, Spike  Uncontrolled type 2 diabetes mellitus with hyperglycemia (HCC)  Right hip pain -     Ambulatory referral to Orthopedic Surgery    I, Angela Held, DO, personally preformed the services described in this documentation.  All medical record  entries made by the scribe were at my direction and in my presence.  I have reviewed the chart and discharge instructions (if applicable) and agree that the record reflects my personal performance and is accurate and complete. 01/19/2023  Angela Held, DO   I,Kennedy C Lynn,acting as a scribe for Angela Held, DO.,have documented all relevant documentation on the behalf of Angela Held, DO,as directed by  Angela Held, DO while in the presence of Angela Held, DO.

## 2023-01-20 LAB — MEASLES/MUMPS/RUBELLA IMMUNITY
Mumps IgG: >300 AU/mL
Rubella: 12.7 Index
Rubeola IgG: 70.6 AU/mL

## 2023-01-20 LAB — QUANTIFERON-TB GOLD PLUS
Mitogen-NIL: 6.21 IU/mL
NIL: 0.1 IU/mL
QuantiFERON-TB Gold Plus: NEGATIVE
TB1-NIL: <0 IU/mL
TB2-NIL: <0 IU/mL

## 2023-01-20 LAB — HEPATITIS B SURFACE ANTIBODY,QUALITATIVE: Hep B S Ab: REACTIVE — AB

## 2023-01-20 LAB — VARICELLA-ZOSTER VIRUS AB(IMMUNITY SCREEN),ACIF,SERUM: VARICELLA ZOSTER VIRUS AB (IMMUNITY SCR),ACIF SERUM: >=1:4 {titer}

## 2023-01-21 ENCOUNTER — Other Ambulatory Visit: Payer: Self-pay | Admitting: Family Medicine

## 2023-01-21 DIAGNOSIS — E1169 Type 2 diabetes mellitus with other specified complication: Secondary | ICD-10-CM

## 2023-01-21 DIAGNOSIS — I10 Essential (primary) hypertension: Secondary | ICD-10-CM

## 2023-01-21 DIAGNOSIS — E1165 Type 2 diabetes mellitus with hyperglycemia: Secondary | ICD-10-CM

## 2023-01-22 ENCOUNTER — Encounter: Payer: Self-pay | Admitting: Family Medicine

## 2023-01-24 LAB — SARS-COV-2 SEMI-QUANTITATIVE TOTAL ANTIBODY, SPIKE: SARS COV2 AB, Total Spike Semi QN: >2500 U/mL — ABNORMAL HIGH (ref <0.8)

## 2023-01-27 NOTE — Progress Notes (Signed)
Pt viewed results and providers comment via Mychart.

## 2023-02-11 ENCOUNTER — Other Ambulatory Visit (INDEPENDENT_AMBULATORY_CARE_PROVIDER_SITE_OTHER): Payer: 59

## 2023-02-11 ENCOUNTER — Ambulatory Visit: Payer: 59 | Admitting: Orthopaedic Surgery

## 2023-02-11 ENCOUNTER — Encounter: Payer: Self-pay | Admitting: Orthopaedic Surgery

## 2023-02-11 VITALS — Wt 234.0 lb

## 2023-02-11 DIAGNOSIS — M25551 Pain in right hip: Secondary | ICD-10-CM

## 2023-02-11 DIAGNOSIS — M1711 Unilateral primary osteoarthritis, right knee: Secondary | ICD-10-CM | POA: Diagnosis not present

## 2023-02-11 NOTE — Progress Notes (Signed)
The patient is a pleasant 62 year old female who comes in with worsening right hip pain for some time now.  It does hurt in her groin and she reports stiffness as well with her right hip.  Her left hip is asymptomatic.  She is a diabetic.  Unfortunately her hemoglobin A1c just in March was 8.9.  Today her weight is 234 pounds with a BMI of 39.55.  She has been trying glucosamine and started on vitamin D3 and calcium recently.  She does not walk with any type of assistive device but she does walk with a limp.  She currently denies any fever, chills, nausea, vomiting.  She denies any chest pain.  I did review all of her notes within epic as well as her medications and past medical history.  On exam her left hip moves smoothly normally with no pain.  The right hip has some more stiffness with rotation and pain in the groin as well.  An AP pelvis lateral the right hip shows significant arthritis with superior lateral joint space narrowing.  I did see x-rays from almost 3 years ago and compared these x-rays which does show the joint space narrowing is slowly gotten worse.  She does understand that she has significant arthritis in her right hip.  For now have recommended using a cane in her opposite hand and trying a supplement such as turmeric.  I would not recommend a steroid injection now given her high hemoglobin A1c.  She is working on better control of that.  She does understand that weight loss will help as well.  I would like to see her back in 3 months for repeat weight and BMI calculation as well as seeing how her blood glucose is trending.  She understands that we would need to have her A1c below 7.7 before safely considering joint replacement surgery due to high infection risk.

## 2023-02-24 ENCOUNTER — Encounter: Payer: Self-pay | Admitting: Internal Medicine

## 2023-02-24 ENCOUNTER — Ambulatory Visit: Payer: 59 | Admitting: Internal Medicine

## 2023-02-24 VITALS — BP 120/72 | HR 84 | Ht 64.5 in | Wt 231.8 lb

## 2023-02-24 DIAGNOSIS — E1165 Type 2 diabetes mellitus with hyperglycemia: Secondary | ICD-10-CM

## 2023-02-24 DIAGNOSIS — E785 Hyperlipidemia, unspecified: Secondary | ICD-10-CM

## 2023-02-24 DIAGNOSIS — E669 Obesity, unspecified: Secondary | ICD-10-CM | POA: Diagnosis not present

## 2023-02-24 MED ORDER — SEMAGLUTIDE(0.25 OR 0.5MG/DOS) 2 MG/3ML ~~LOC~~ SOPN: 0.5000 mg | PEN_INJECTOR | SUBCUTANEOUS | 3 refills | Status: DC

## 2023-02-24 NOTE — Patient Instructions (Addendum)
Please continue: - Metformin XR 500 mg in am and 1000 mg with dinner  Switch from Rybelsus to: - Ozempic 0.25 mg weekly x 2 weeks, then increase to 0.5 mg weekly  Please return in 3-4 months.

## 2023-02-24 NOTE — Progress Notes (Signed)
Patient ID: Angela Barr, female   DOB: 27-Sep-1961, 62 y.o.   MRN: [id number]  HPI: Angela Barr is a 62 y.o.-year-old female, returning for f/u for DM2, dx in 2007, non-insulin-dependent, uncontrolled, without long term complications, but with hyperglycemia. Last visit 4 months ago.  Interim history: No increased urination, blurry vision, nausea, chest pain. She is traveling a lot for work, eating out.  She had hip pain - sees orthopedics.  She cannot exercise.  She declined a steroid injection due to blood sugars being high.  Reviewed HbA1c levels: Lab Results  Component Value Date   HGBA1C 8.9 (H) 01/16/2023   HGBA1C 7.6 (A) 10/22/2022   HGBA1C 7.8 (A) 06/19/2022   HGBA1C 6.4 (A) 01/29/2021   HGBA1C 7.0 (A) 09/25/2020   HGBA1C 6.1 (A) 05/15/2020   HGBA1C 7.1 (A) 01/10/2020   HGBA1C 6.4 (A) 09/12/2019   HGBA1C 6.7 (A) 05/12/2019   HGBA1C 7.2 (A) 01/19/2019   HGBA1C 6.6 (A) 09/17/2018   HGBA1C 6.4 (A) 05/14/2018   HGBA1C 7.3 02/08/2018   HGBA1C 6.6 11/11/2017   HGBA1C 6.6 08/11/2017   HGBA1C 6.6 04/01/2017   HGBA1C 6.3 11/14/2016   HGBA1C 6.6 08/15/2016   HGBA1C 6.4 05/15/2016   HGBA1C 7.5 (H) 02/05/2016  09/25/2015: HbA1c 7.1%  She is now on: - Metformin XR 500 mg with lunch and 1000 mg with dinner >> 1500 mg with dinner >> 500 mg in am and 1000 mg with dinner - Victoza started 2 weeks ago: 0.6 >> 1.2 >> mg in am >> AP >> Rybelsus 7 >> 14 mg daily in am (increased by PCP 12/2022) - tolerated well  Stopped glimepiride 2 mg before dinner 08/2020 Stopped Jardiance due to price.  We then restarted this 05/2022 but developed pelvic abscesses-had to be on antibiotics >> stopped the medication 09/2022 Invokana caused yeast infections. Stopped Trulicity 1.5 mg >> stopped b/c Nausea and HAs, and also expensive - now tolerating it well, with mild nausea. She was Bydureon >> lumps at inj. Site.  Victoza was previously too expensive. Previously on: Ozempic 0.5 mg weekly >>  restarted 08/2020 >> 1 mg weekly >> stopped 01/2022 due to abdominal pain.  She is checking sugars 0-1x a day: - am:  119, 149, 166 >> 118-140 >> 150-199 >> 125-164 >> 96-190 - 2h after b'fast: 191 >> n/c >> 189 >> n/c - before lunch:  102 >> 120-130 >> n/c >> 228 >> 140 >> n/c - 2h after lunch: 144-220, 270 >> n/c >> 101-116 >> n/c  - before dinner:  116 >> n/c >> 85, 98 >> n/c - 2h after dinner:  178 >> n/c >> 89 >> n/c - bedtime:  156 >> 102 >> 149 >> n/c >> 169, 180 >> 123 >> 188 - nighttime: n/c >> 70s occas. >> 129 >> n/c Lowest sugar was 127 >> 119 >> 118 >> 150 >> 123 >> 96; she has hypoglycemia awareness in the 90s. Highest sugar was 189 >> 140s >> 228>> 164 >> 190.  Glucometer: One Touch Verio Flex   Eats sunflower seeds and nuts.  She stopped sodas but changed to sweet tea in the past.  I advised her to also stop this >> now only diet drinks.  No CKD; Last BUN/creatinine:  Lab Results  Component Value Date   BUN 10 01/16/2023   CREATININE 0.57 01/16/2023   Normal ACR levels: Lab Results  Component Value Date   MICRALBCREAT 1.6 01/16/2023   MICRALBCREAT 0.6 05/14/2018   MICRALBCREAT  0.7 02/05/2016   MICRALBCREAT 0.7 12/25/2014   MICRALBCREAT 2.0 06/13/2014   MICRALBCREAT 0.7 04/04/2013   MICRALBCREAT 0.5 01/30/2012   MICRALBCREAT 0.3 06/17/2011  We stopped lisinopril as she had low blood pressure and dizziness in the past. On losartan.  + HL; last set of lipids: Lab Results  Component Value Date   CHOL 156 01/16/2023   HDL 46.30 01/16/2023   LDLCALC 93 01/16/2023   TRIG 81.0 01/16/2023   CHOLHDL 3 01/16/2023  On Crestor 20 daily.  - last eye exam was in 12/2022: No DR reportedly. [facility].  She did have uveitis which was treated with steroid eyedrops.  -She denies numbness and tingling in her feet.  Last foot exam 05/2022.  ROS: + see HPI  Past Medical History:  Diagnosis Date   Arthritis    bilateral hips   Colon polyps 2008   [place]--Dr  Norwood   Diabetes mellitus 2006   on meds   Hyperlipemia 2012   on meds   Hypertension 2012   on meds   Obesity    Palpitations    Past Surgical History:  Procedure Laterality Date   BREAST BIOPSY Right 2003   COLONOSCOPY  07/07/2011   RK-F/V-suprep(exc)-TA x 1   POLYPECTOMY     TUBAL LIGATION  1990   VAGINAL HYSTERECTOMY  2006   WISDOM TOOTH EXTRACTION     History   Social History   Marital Status: Married    Spouse Name: N/A   Number of Children: 3   Occupational History   RN    Social History Main Topics   Smoking status: Never Smoker    Smokeless tobacco: Never Used   Alcohol Use: No   Drug Use: No   Social History Narrative   3 caffeine drinks daily    Current Outpatient Medications on File Prior to Visit  Medication Sig Dispense Refill   fluticasone (FLONASE) 50 MCG/ACT nasal spray Place 2 sprays into both nostrils daily. 16 g 6   losartan (COZAAR) 100 MG tablet TAKE 1 TABLET BY MOUTH EVERY DAY 90 tablet 1   metFORMIN (GLUCOPHAGE-XR) 500 MG 24 hr tablet TAKE 3 TABLETS (1,500 MG TOTAL) BY MOUTH DAILY WITH BREAKFAST 270 tablet 3   Multiple Vitamin (MULTIVITAMIN ADULT PO) Take by mouth.     ONETOUCH VERIO test strip CHECK YOUR BLOOD SUGAR TWICE DAILY 100 strip 11   rosuvastatin (CRESTOR) 20 MG tablet TAKE 1 TABLET BY MOUTH EVERYDAY AT BEDTIME 90 tablet 3   Semaglutide (RYBELSUS) 14 MG TABS Take 1 tablet (14 mg total) by mouth daily. 90 tablet 3   VITAMIN D PO Take 1 tablet by mouth daily at 6 (six) AM.     No current facility-administered medications on file prior to visit.   Allergies  Allergen Reactions   Bactrim Nausea Only   Sulfa Antibiotics Nausea And Vomiting   Sulfamethoxazole-Trimethoprim    Family History  Problem Relation Age of Onset   Heart failure Father    Hypertension Father    Colon cancer Father 34   Diabetes Father    Heart disease Father    Sudden death Father    Obesity Father    Colon polyps Father 42   Hypertension Mother     High Cholesterol Mother    Stroke Mother    Breast cancer Maternal Grandmother    Diabetes Paternal Grandmother    Esophageal cancer Neg Hx    Rectal cancer Neg Hx    Stomach  cancer Neg Hx    PE: There were no vitals taken for this visit.  Wt Readings from Last 3 Encounters:  02/11/23 234 lb (106.1 kg)  01/19/23 232 lb 6.4 oz (105.4 kg)  01/16/23 232 lb 9.6 oz (105.5 kg)   Constitutional: overweight, in NAD Eyes: no exophthalmos ENT: no masses palpated in neck, no cervical lymphadenopathy Cardiovascular: RRR, No MRG Respiratory: CTA B Musculoskeletal: no deformities Skin: no rashes Neurological: no tremor with outstretched hands  ASSESSMENT: 1. DM2, non-insulin-dependent, uncontrolled, without long term complications, but with hyperglycemia  2. Obesity class 2  3. HL  PLAN:  1. Patient with longstanding, uncontrolled, type 2 diabetes, on metformin and daily GLP-1 receptor agonist.  At last visit, she came off SGLT2 inhibitor due to pelvic abscesses.  We increased her Victoza dose.  She had some diarrhea but she was not convinced that this was from Victoza.  However, she afterwards contacted me with abdominal pain from Victoza so we changed to Rybelsus.  The dose of Rybelsus was increased by PCP after her HbA1c returned higher, at 8.9% 1 month ago.  She tolerates the higher dose of Rybelsus well. -She does not have many blood sugars checked in the last 2 weeks, blood sugars are overall higher.  She has 1 blood sugar target in the morning, in the 90s, the rest of the blood sugars being above target.  She is traveling a lot for work and is eating fast food.  We discussed about improving diet, but I also recommended to change from Rybelsus to Ozempic low-dose and then increase as tolerated.  We discussed about possibly injecting this in the upper thighs as maybe her abdominal pain is related to the injection sites.  She agrees to try this. -If she cannot tolerate Ozempic, we may need  to add long-acting insulin - I recommended:  Patient Instructions  Please continue: - Metformin XR 500 mg in am and 1000 mg with dinner  Switch from Rybelsus to: - Ozempic 0.25 mg weekly x 2 weeks, then increase to 0.5 mg weekly  Please return in 3-4 months.    - advised to check sugars at different times of the day - 1x a day, rotating check times - advised for yearly eye exams >> she is UTD - return to clinic in 3-4 months  2. Obesity class 2 -Previously on Ozempic, which helped with weight loss but had to come off due to abdominal pain. -She was previously on the [employer] -Of note, she was seen in the [facility] in the past but she did not feel that this was a good fit for her -She gained 1 pound before last visit, previously gained 26 pounds.  At that time, I recommended to work on diet and start exercise consistently but also suggested an SGLT2 inhibitor.  She started Jardiance but developed pelvic abscesses so she came off Jardiance 2 weeks prior to our last visit.  We continued her off the medication. -At today's visit, she is on Rybelsus, tolerated well.  Will switch to Ozempic for better blood sugar control but we discussed that this would also benefit her weight.  3. HL -Reviewed latest lipid panel from 12/2022: Fractions at goal: Lab Results  Component Value Date   CHOL 156 01/16/2023   HDL 46.30 01/16/2023   LDLCALC 93 01/16/2023   TRIG 81.0 01/16/2023   CHOLHDL 3 01/16/2023  -On rosuvastatin 20 mg daily without side effects  Carlus Pavlov, MD PhD [facility] Endocrinology

## 2023-03-26 ENCOUNTER — Other Ambulatory Visit: Payer: Self-pay | Admitting: Family Medicine

## 2023-03-26 DIAGNOSIS — I10 Essential (primary) hypertension: Secondary | ICD-10-CM

## 2023-03-26 DIAGNOSIS — Z0000 Encounter for general adult medical examination without abnormal findings: Secondary | ICD-10-CM

## 2023-05-13 ENCOUNTER — Encounter: Payer: Self-pay | Admitting: Orthopaedic Surgery

## 2023-05-13 ENCOUNTER — Ambulatory Visit: Payer: 59 | Admitting: Orthopaedic Surgery

## 2023-05-13 VITALS — Ht 65.25 in | Wt 228.0 lb

## 2023-05-13 DIAGNOSIS — M5442 Lumbago with sciatica, left side: Secondary | ICD-10-CM

## 2023-05-13 DIAGNOSIS — G8929 Other chronic pain: Secondary | ICD-10-CM

## 2023-05-13 DIAGNOSIS — M25551 Pain in right hip: Secondary | ICD-10-CM

## 2023-05-13 DIAGNOSIS — M1711 Unilateral primary osteoarthritis, right knee: Secondary | ICD-10-CM

## 2023-05-13 MED ORDER — DICLOFENAC SODIUM 75 MG PO TBEC: 75.0000 mg | DELAYED_RELEASE_TABLET | Freq: Two times a day (BID) | ORAL | 1 refills | Status: DC | PRN

## 2023-05-13 MED ORDER — GABAPENTIN 300 MG PO CAPS: 300.0000 mg | ORAL_CAPSULE | Freq: Every day | ORAL | 1 refills | Status: DC

## 2023-05-13 NOTE — Progress Notes (Signed)
The patient is a 62 year old female well-known to me.  She has been on a weight loss journey and her BMI today is down to 37.65.  She has well-documented severe arthritis of the right hip has been slowly worsening.  Now she has been developing left hip pain and thigh pain as well as sciatica.  She says her the muscles in her thighs are tight.  Back in March her hemoglobin A1c was 8.9.  It has not been checked since then.  She is scheduled to have it checked she thinks next month.  She has not had any type of physical therapy at all.  Her right hip still has significant pain with internal and external rotation as well as stiffness.  Previous x-rays show worsening arthritis of the right hip.  She does have a positive straight leg raise on the left side and she just looks like she is in discomfort quite significantly especially with getting up from a seated position.  I would like to send her to outpatient physical therapy to work on any type of a back program that could help decrease her low back pain and sciatica on the left side.  They can also work on bilateral hip and lower extremity strengthening.  She agrees with this treatment plan.  Will try some Neurontin to take at bedtime and diclofenac to take as an anti-inflammatory twice daily.  Will see her back in 6 weeks after course of physical therapy.  She agrees with this treatment plan.  Hopefully by then she will have had a repeat hemoglobin A1c per her PCP.

## 2023-06-24 ENCOUNTER — Ambulatory Visit: Payer: 59 | Admitting: Orthopaedic Surgery

## 2023-06-30 ENCOUNTER — Ambulatory Visit: Payer: 59 | Admitting: Internal Medicine

## 2023-06-30 ENCOUNTER — Encounter: Payer: Self-pay | Admitting: Internal Medicine

## 2023-06-30 VITALS — BP 124/70 | HR 93 | Ht 65.25 in | Wt 230.8 lb

## 2023-06-30 DIAGNOSIS — Z7984 Long term (current) use of oral hypoglycemic drugs: Secondary | ICD-10-CM

## 2023-06-30 DIAGNOSIS — E669 Obesity, unspecified: Secondary | ICD-10-CM | POA: Diagnosis not present

## 2023-06-30 DIAGNOSIS — E1165 Type 2 diabetes mellitus with hyperglycemia: Secondary | ICD-10-CM | POA: Diagnosis not present

## 2023-06-30 DIAGNOSIS — Z7985 Long-term (current) use of injectable non-insulin antidiabetic drugs: Secondary | ICD-10-CM

## 2023-06-30 DIAGNOSIS — E785 Hyperlipidemia, unspecified: Secondary | ICD-10-CM | POA: Diagnosis not present

## 2023-06-30 LAB — POCT GLYCOSYLATED HEMOGLOBIN (HGB A1C): Hemoglobin A1C: 7.6 % — AB (ref 4.0–5.6)

## 2023-06-30 MED ORDER — GLIPIZIDE 5 MG PO TABS: 5.0000 mg | ORAL_TABLET | Freq: Every day | ORAL | 3 refills | Status: DC

## 2023-06-30 NOTE — Progress Notes (Signed)
Patient ID: Angela Barr, female   DOB: 1961/07/30, 62 y.o.   MRN: [id number]  HPI: Angela Barr is a 62 y.o.-year-old female, returning for f/u for DM2, dx in 2007, non-insulin-dependent, uncontrolled, without long term complications, but with hyperglycemia. Last visit 5 months ago.  Interim history: No increased urination, blurry vision, nausea, chest pain. She still has hip pain - sees orthopedics.  They recommended surgery, but she is reticent to have this.  She feels that her muscles and tendons are tight.  She cannot exercise.  She declined steroid injections in the past.  Reviewed HbA1c levels: Lab Results  Component Value Date   HGBA1C 8.9 (H) 01/16/2023   HGBA1C 7.6 (A) 10/22/2022   HGBA1C 7.8 (A) 06/19/2022   HGBA1C 6.4 (A) 01/29/2021   HGBA1C 7.0 (A) 09/25/2020   HGBA1C 6.1 (A) 05/15/2020   HGBA1C 7.1 (A) 01/10/2020   HGBA1C 6.4 (A) 09/12/2019   HGBA1C 6.7 (A) 05/12/2019   HGBA1C 7.2 (A) 01/19/2019   HGBA1C 6.6 (A) 09/17/2018   HGBA1C 6.4 (A) 05/14/2018   HGBA1C 7.3 02/08/2018   HGBA1C 6.6 11/11/2017   HGBA1C 6.6 08/11/2017   HGBA1C 6.6 04/01/2017   HGBA1C 6.3 11/14/2016   HGBA1C 6.6 08/15/2016   HGBA1C 6.4 05/15/2016   HGBA1C 7.5 (H) 02/05/2016  09/25/2015: HbA1c 7.1%  She is now on: - Metformin XR 500 mg with lunch and 1000 mg with dinner >> 1500 mg with dinner >> 500 mg in am and 1000 mg with dinner - Victoza started 2 weeks ago: 0.6 >> 1.2 >> mg in am >> AP >> Rybelsus 7 >> 14 mg daily in am (increased by PCP 12/2022) - tolerated well >> Ozempic 0.5 mg weekly  Stopped glimepiride 2 mg before dinner 08/2020 Stopped Jardiance due to price.  We then restarted this 05/2022 but developed pelvic abscesses-had to be on antibiotics >> stopped the medication 09/2022 Invokana caused yeast infections. Stopped Trulicity 1.5 mg >> stopped b/c Nausea and HAs, and also expensive - now tolerating it well, with mild nausea. She was Bydureon >> lumps at inj. Site.   Victoza was previously too expensive. Previously on: Ozempic 0.5 mg weekly >> restarted 08/2020 >> 1 mg weekly >> stopped 01/2022 due to abdominal pain.  She is checking sugars 0-1x a day: - am: 118-140 >> 150-199 >> 125-164 >> 96-190 >> 132-165  - 2h after b'fast: 191 >> n/c >> 189 >> n/c >> 201 - before lunch: 102 >> 120-130 >> n/c >> 228 >> 140 >> n/c - 2h after lunch: 144-220, 270 >> n/c >> 101-116 >> n/c  - before dinner:  116 >> n/c >> 85, 98 >> n/c - 2h after dinner:  178 >> n/c >> 89 >> n/c - bedtime:  149 >> n/c >> 169, 180 >> 123 >> 188 >> 143  - nighttime: n/c >> 70s occas. >> 129 >> n/c Lowest sugar was 150 >> 123 >> 96 >> 132; she has hypoglycemia awareness in the 90s. Highest sugar was 228 >> 164 >> 190 >> 201.  Glucometer: One Touch Verio Flex   Eats sunflower seeds and nuts.  She stopped sodas but changed to sweet tea in the past.  I advised her to also stop this >> now only diet drinks.  No CKD; Last BUN/creatinine:  Lab Results  Component Value Date   BUN 10 01/16/2023   CREATININE 0.57 01/16/2023   Normal ACR levels: Lab Results  Component Value Date   MICRALBCREAT 1.6 01/16/2023  MICRALBCREAT 0.6 05/14/2018   MICRALBCREAT 0.7 02/05/2016   MICRALBCREAT 0.7 12/25/2014   MICRALBCREAT 2.0 06/13/2014   MICRALBCREAT 0.7 04/04/2013   MICRALBCREAT 0.5 01/30/2012   MICRALBCREAT 0.3 06/17/2011  We stopped lisinopril as she had low blood pressure and dizziness in the past. On losartan.  + HL; last set of lipids: Lab Results  Component Value Date   CHOL 156 01/16/2023   HDL 46.30 01/16/2023   LDLCALC 93 01/16/2023   TRIG 81.0 01/16/2023   CHOLHDL 3 01/16/2023  On Crestor 20 daily.  - last eye exam was in summer 2024: No DR reportedly. [facility].  She did have uveitis which was treated with steroid eyedrops.  -She denies numbness and tingling in her feet.  Last foot exam 05/2022.  ROS: + see HPI  Past Medical History:  Diagnosis Date   Arthritis     bilateral hips   Colon polyps 2008   [place]--Dr Norwood   Diabetes mellitus 2006   on meds   Hyperlipemia 2012   on meds   Hypertension 2012   on meds   Obesity    Palpitations    Past Surgical History:  Procedure Laterality Date   BREAST BIOPSY Right 2003   COLONOSCOPY  07/07/2011   RK-F/V-suprep(exc)-TA x 1   POLYPECTOMY     TUBAL LIGATION  1990   VAGINAL HYSTERECTOMY  2006   WISDOM TOOTH EXTRACTION     History   Social History   Marital Status: Married    Spouse Name: N/A   Number of Children: 3   Occupational History   RN    Social History Main Topics   Smoking status: Never Smoker    Smokeless tobacco: Never Used   Alcohol Use: No   Drug Use: No   Social History Narrative   3 caffeine drinks daily    Current Outpatient Medications on File Prior to Visit  Medication Sig Dispense Refill   diclofenac (VOLTAREN) 75 MG EC tablet Take 1 tablet (75 mg total) by mouth 2 (two) times daily between meals as needed. 60 tablet 1   fluticasone (FLONASE) 50 MCG/ACT nasal spray Place 2 sprays into both nostrils daily. 16 g 6   gabapentin (NEURONTIN) 300 MG capsule Take 1 capsule (300 mg total) by mouth at bedtime. 60 capsule 1   losartan (COZAAR) 100 MG tablet TAKE 1 TABLET BY MOUTH EVERY DAY 90 tablet 1   metFORMIN (GLUCOPHAGE-XR) 500 MG 24 hr tablet TAKE 3 TABLETS (1,500 MG TOTAL) BY MOUTH DAILY WITH BREAKFAST 270 tablet 3   Multiple Vitamin (MULTIVITAMIN ADULT PO) Take by mouth.     ONETOUCH VERIO test strip CHECK YOUR BLOOD SUGAR TWICE DAILY 100 strip 11   rosuvastatin (CRESTOR) 20 MG tablet TAKE 1 TABLET BY MOUTH EVERYDAY AT BEDTIME 90 tablet 3   Semaglutide,0.25 or 0.5MG /DOS, 2 MG/3ML SOPN Inject 0.5 mg into the skin once a week. 9 mL 3   VITAMIN D PO Take 1 tablet by mouth daily at 6 (six) AM.     No current facility-administered medications on file prior to visit.   Allergies  Allergen Reactions   Bactrim Nausea Only   Sulfa Antibiotics Nausea And  Vomiting   Sulfamethoxazole-Trimethoprim    Family History  Problem Relation Age of Onset   Heart failure Father    Hypertension Father    Colon cancer Father 47   Diabetes Father    Heart disease Father    Sudden death Father  Obesity Father    Colon polyps Father 81   Hypertension Mother    High Cholesterol Mother    Stroke Mother    Breast cancer Maternal Grandmother    Diabetes Paternal Grandmother    Esophageal cancer Neg Hx    Rectal cancer Neg Hx    Stomach cancer Neg Hx    PE: BP 124/70   Pulse 93   Ht 5' 5.25" (1.657 m)   Wt 230 lb 12.8 oz (104.7 kg)   SpO2 97%   BMI 38.11 kg/m   Wt Readings from Last 3 Encounters:  06/30/23 230 lb 12.8 oz (104.7 kg)  05/13/23 228 lb (103.4 kg)  02/24/23 231 lb 12.8 oz (105.1 kg)   Constitutional: overweight, in NAD Eyes: no exophthalmos ENT: no masses palpated in neck, no cervical lymphadenopathy Cardiovascular: RRR, No MRG Respiratory: CTA B Musculoskeletal: no deformities Skin: no rashes Neurological: no tremor with outstretched hands Diabetic Foot Exam - Simple   Simple Foot Form Diabetic Foot exam was performed with the following findings: Yes 06/30/2023  9:17 AM  Visual Inspection No deformities, no ulcerations, no other skin breakdown bilaterally: Yes Sensation Testing Intact to touch and monofilament testing bilaterally: Yes Pulse Check Posterior Tibialis and Dorsalis pulse intact bilaterally: Yes Comments L halluceal toenail dark discoloration    She saw podiatry >> Bx of the L hallux toenail >> nonmalignant  ASSESSMENT: 1. DM2, non-insulin-dependent, uncontrolled, without long term complications, but with hyperglycemia  2. Obesity class 2  3. HL  PLAN:  1. Patient with longstanding, uncontrolled, type 2 diabetes, on metformin and weekly GLP-1 receptor agonist.  Previously on SGLT2 inhibitor but off due to pelvic abscesses.  She was also previously on Victoza, but developed abdominal pain so we  changed to Rybelsus.  She was tolerating the Rybelsus well HbA1c was higher at last visit, at 8.9%, and she was also interested in more weight loss so we changed to Ozempic.  We did discuss that if she was not able to tolerate Ozempic, we may need to use insulin.  However, she is tolerating Ozempic well. -At today's visit, sugars are better in the morning, but still above target.  She is not consistently checking later in the day.  Due to the improvement in blood sugars, we decided to continue the current regimen but I did suggest to add 1 tablet of sulfonylurea before a larger meal, especially dinner.  At next visit, if the sugars remain elevated, we may increase the dose of Ozempic. - I recommended:  Patient Instructions  Please continue: - Metformin XR 500 mg in am and 1000 mg with dinner - Ozempic 0.5 mg weekly  Add: - Glipizide 5 mg before a larger meal  Please return in 3-4 months.   - we checked her HbA1c: 7.6% (better) - advised to check sugars at different times of the day - 1x a day, rotating check times - advised for yearly eye exams >> she is UTD - return to clinic in 3-4 months  2. Obesity class 2 -Previously on Ozempic, which helped with weight loss but had to come off due to abdominal pain. -She was previously on the [employer] -Of note, she was seen in the [facility] in the past but she did not feel that this was a good fit for her -We tried an SGLT2 inhibitor, Jardiance, but developed pelvic abscesses so she came off Jardiance 2 weeks prior to our last visit.  We continued her off the  medication. -At last visit, she was on Rybelsus, tolerated well.  We discussed about switching to Ozempic for better blood sugar control and also weight benefits -She lost 1 pound since last visit,  3. HL -Reviewed the latest lipid panel from 12/2022: Fractions at goal: Lab Results  Component Value Date   CHOL 156 01/16/2023   HDL 46.30 01/16/2023   LDLCALC 93  01/16/2023   TRIG 81.0 01/16/2023   CHOLHDL 3 01/16/2023  -On rosuvastatin 20 mg daily without side effects  Carlus Pavlov, MD PhD [facility] Endocrinology

## 2023-06-30 NOTE — Patient Instructions (Addendum)
Please continue: - Metformin XR 500 mg in am and 1000 mg with dinner - Ozempic 0.5 mg weekly  Add: - Glipizide 5 mg before a larger meal  Please return in 3-4 months.

## 2023-07-01 NOTE — Addendum Note (Signed)
Addended by: Pollie Meyer on: 07/01/2023 08:38 AM   Modules accepted: Orders

## 2023-07-06 ENCOUNTER — Ambulatory Visit: Payer: 59 | Admitting: Orthopaedic Surgery

## 2023-08-07 ENCOUNTER — Encounter: Payer: Self-pay | Admitting: Internal Medicine

## 2023-08-07 LAB — HM DIABETES EYE EXAM

## 2023-08-19 ENCOUNTER — Other Ambulatory Visit: Payer: Self-pay | Admitting: Orthopaedic Surgery

## 2023-09-24 ENCOUNTER — Other Ambulatory Visit: Payer: Self-pay | Admitting: Internal Medicine

## 2023-09-24 ENCOUNTER — Other Ambulatory Visit: Payer: Self-pay | Admitting: Family Medicine

## 2023-09-24 DIAGNOSIS — E0822 Diabetes mellitus due to underlying condition with diabetic chronic kidney disease: Secondary | ICD-10-CM

## 2023-09-24 DIAGNOSIS — I10 Essential (primary) hypertension: Secondary | ICD-10-CM

## 2023-09-24 DIAGNOSIS — Z0000 Encounter for general adult medical examination without abnormal findings: Secondary | ICD-10-CM

## 2023-10-19 ENCOUNTER — Other Ambulatory Visit: Payer: Self-pay | Admitting: Orthopaedic Surgery

## 2023-11-03 ENCOUNTER — Ambulatory Visit: Payer: 59 | Admitting: Internal Medicine

## 2023-11-03 NOTE — Progress Notes (Deleted)
 Patient ID: Angela Barr, female   DOB: 1961/09/18, 63 y.o.   MRN: [id number]  HPI: Angela Barr is a 63 y.o.-year-old female, returning for f/u for DM2, dx in 2007, non-insulin -dependent, uncontrolled, without long term complications, but with hyperglycemia. Last visit 4 months ago.  Interim history: No increased urination, blurry vision, nausea, chest pain. She still has hip pain - sees orthopedics.  They recommended surgery, but she is reticent to have this.  She feels that her muscles and tendons are tight.  She cannot exercise.  She declined steroid injections in the past.  Reviewed HbA1c levels: Lab Results  Component Value Date   HGBA1C 7.6 (A) 06/30/2023   HGBA1C 8.9 (H) 01/16/2023   HGBA1C 7.6 (A) 10/22/2022   HGBA1C 7.8 (A) 06/19/2022   HGBA1C 6.4 (A) 01/29/2021   HGBA1C 7.0 (A) 09/25/2020   HGBA1C 6.1 (A) 05/15/2020   HGBA1C 7.1 (A) 01/10/2020   HGBA1C 6.4 (A) 09/12/2019   HGBA1C 6.7 (A) 05/12/2019   HGBA1C 7.2 (A) 01/19/2019   HGBA1C 6.6 (A) 09/17/2018   HGBA1C 6.4 (A) 05/14/2018   HGBA1C 7.3 02/08/2018   HGBA1C 6.6 11/11/2017   HGBA1C 6.6 08/11/2017   HGBA1C 6.6 04/01/2017   HGBA1C 6.3 11/14/2016   HGBA1C 6.6 08/15/2016   HGBA1C 6.4 05/15/2016  09/25/2015: HbA1c 7.1%  She is now on: - Metformin  XR 500 mg with lunch and 1000 mg with dinner >> 1500 mg with dinner >> 500 mg in am and 1000 mg with dinner - Ozempic  0.5 mg weekly  Stopped glimepiride  2 mg before dinner 08/2020 Stopped Jardiance  due to price.  We then restarted this 05/2022 but developed pelvic abscesses-had to be on antibiotics >> stopped the medication 09/2022 Invokana  caused yeast infections. Stopped Trulicity  1.5 mg >> stopped b/c Nausea and HAs, and also expensive - now tolerating it well, with mild nausea. She was Bydureon  >> lumps at inj. Site.  Previously on: Victoza  and Ozempic   1 mg weekly >> stopped 01/2022 due to abdominal pain. She was previously on Rybelsus , tolerated well  She  is checking sugars 0-1x a day: - am: 118-140 >> 150-199 >> 125-164 >> 96-190 >> 132-165  - 2h after b'fast: 191 >> n/c >> 189 >> n/c >> 201 - before lunch: 102 >> 120-130 >> n/c >> 228 >> 140 >> n/c - 2h after lunch: 144-220, 270 >> n/c >> 101-116 >> n/c  - before dinner:  116 >> n/c >> 85, 98 >> n/c - 2h after dinner:  178 >> n/c >> 89 >> n/c - bedtime:  149 >> n/c >> 169, 180 >> 123 >> 188 >> 143  - nighttime: n/c >> 70s occas. >> 129 >> n/c Lowest sugar was 150 >> 123 >> 96 >> 132; she has hypoglycemia awareness in the 90s. Highest sugar was 228 >> 164 >> 190 >> 201.  Glucometer: One Touch Verio Flex   Eats sunflower seeds and nuts.  She stopped sodas but changed to sweet tea in the past.  I advised her to also stop this >> now only diet drinks.  No CKD; Last BUN/creatinine:  Lab Results  Component Value Date   BUN 10 01/16/2023   CREATININE 0.57 01/16/2023   Normal ACR levels: Lab Results  Component Value Date   MICRALBCREAT 1.6 01/16/2023   MICRALBCREAT 0.6 05/14/2018   MICRALBCREAT 0.7 02/05/2016   MICRALBCREAT 0.7 12/25/2014   MICRALBCREAT 2.0 06/13/2014   MICRALBCREAT 0.7 04/04/2013   MICRALBCREAT 0.5 01/30/2012   MICRALBCREAT 0.3 06/17/2011  We stopped lisinopril  as she had low blood pressure and dizziness in the past. On losartan .  + HL; last set of lipids: Lab Results  Component Value Date   CHOL 156 01/16/2023   HDL 46.30 01/16/2023   LDLCALC 93 01/16/2023   TRIG 81.0 01/16/2023   CHOLHDL 3 01/16/2023  On Crestor  20 daily.  - last eye exam was in summer 2024: No DR reportedly. [facility].  She did have uveitis which was treated with steroid eyedrops.  -She denies numbness and tingling in her feet.  Last foot exam 06/30/2023.  ROS: + see HPI  Past Medical History:  Diagnosis Date   Arthritis    bilateral hips   Colon polyps 2008   [place]--Dr Angela Barr   Diabetes mellitus 2006   on meds   Hyperlipemia 2012   on meds   Hypertension 2012   on  meds   Obesity    Palpitations    Past Surgical History:  Procedure Laterality Date   BREAST BIOPSY Right 2003   COLONOSCOPY  07/07/2011   RK-F/V-suprep(exc)-TA x 1   POLYPECTOMY     TUBAL LIGATION  1990   VAGINAL HYSTERECTOMY  2006   WISDOM TOOTH EXTRACTION     History   Social History   Marital Status: Married    Spouse Name: N/A   Number of Children: 3   Occupational History   RN    Social History Main Topics   Smoking status: Never Smoker    Smokeless tobacco: Never Used   Alcohol Use: No   Drug Use: No   Social History Narrative   3 caffeine drinks daily    Current Outpatient Medications on File Prior to Visit  Medication Sig Dispense Refill   diclofenac  (VOLTAREN ) 75 MG EC tablet TAKE 1 TABLET (75 MG TOTAL) BY MOUTH 2 (TWO) TIMES DAILY BETWEEN MEALS AS NEEDED. 60 tablet 1   gabapentin  (NEURONTIN ) 300 MG capsule Take 1 capsule (300 mg total) by mouth at bedtime. 60 capsule 1   glipiZIDE  (GLUCOTROL ) 5 MG tablet Take 1 tablet (5 mg total) by mouth daily before supper. 60 tablet 3   losartan  (COZAAR ) 100 MG tablet TAKE 1 TABLET BY MOUTH EVERY DAY 90 tablet 0   metFORMIN  (GLUCOPHAGE -XR) 500 MG 24 hr tablet TAKE 3 TABLETS (1,500 MG TOTAL) BY MOUTH DAILY WITH BREAKFAST 270 tablet 1   Multiple Vitamin (MULTIVITAMIN ADULT PO) Take by mouth.     ONETOUCH VERIO test strip CHECK YOUR BLOOD SUGAR TWICE DAILY 100 strip 11   rosuvastatin  (CRESTOR ) 20 MG tablet TAKE 1 TABLET BY MOUTH EVERYDAY AT BEDTIME 90 tablet 3   Semaglutide ,0.25 or 0.5MG /DOS, 2 MG/3ML SOPN Inject 0.5 mg into the skin once a week. 9 mL 3   VITAMIN D  PO Take 1 tablet by mouth daily at 6 (six) AM.     No current facility-administered medications on file prior to visit.   Allergies  Allergen Reactions   Bactrim Nausea Only   Sulfa Antibiotics Nausea And Vomiting   Sulfamethoxazole-Trimethoprim    Family History  Problem Relation Age of Onset   Heart failure Father    Hypertension Father    Colon  cancer Father 46   Diabetes Father    Heart disease Father    Sudden death Father    Obesity Father    Colon polyps Father 72   Hypertension Mother    High Cholesterol Mother    Stroke Mother    Breast cancer Maternal Grandmother  Diabetes Paternal Grandmother    Esophageal cancer Neg Hx    Rectal cancer Neg Hx    Stomach cancer Neg Hx    PE: There were no vitals taken for this visit.  Wt Readings from Last 3 Encounters:  06/30/23 230 lb 12.8 oz (104.7 kg)  05/13/23 228 lb (103.4 kg)  02/24/23 231 lb 12.8 oz (105.1 kg)   Constitutional: overweight, in NAD Eyes: no exophthalmos ENT: no masses palpated in neck, no cervical lymphadenopathy Cardiovascular: RRR, No MRG Respiratory: CTA B Musculoskeletal: no deformities Skin: no rashes Neurological: no tremor with outstretched hands  She saw podiatry >> Bx of the L hallux toenail >> nonmalignant  ASSESSMENT: 1. DM2, non-insulin -dependent, uncontrolled, without long term complications, but with hyperglycemia  2. Obesity class 2  3. HL  PLAN:  1. Patient with longstanding, uncontrolled, type 2 diabetes, on metformin , sulfonylurea, and weekly GLP-1 receptor agonist.  Previously on SGLT2 inhibitor but off due to pelvic abscesses.  She was also previously on Victoza  and Ozempic  but developed abdominal pain.  We changed to Rybelsus  which she was tolerating well.  Afterwards, we changed to Ozempic  and she is tolerating it well.  At last visit sugars were better in the morning but still above target.  She was still not checking consistently later in the day.  We discussed about starting to check at other times of the day, also, but we also started glipizide  5 mg before a larger meal.  HbA1c at that time was lower, at 7.6%. - I recommended:  Patient Instructions  Please continue: - Metformin  XR 500 mg in am and 1000 mg with dinner - Glipizide  5 mg before a larger meal - Ozempic  0.5 mg weekly  Please return in 3-4 months.   - we  checked her HbA1c: 7%  - advised to check sugars at different times of the day - 1x a day, rotating check times - advised for yearly eye exams >> she is UTD - return to clinic in 3-4 months  2. Obesity class 2 -She was on Ozempic  previously with help with weight loss but she had to come off due to abdominal pain.  However, we tried Rybelsus  and then retried Ozempic  and she is not tolerating it well. -She was also previously on the [employer] -She was seen in the [facility] in the past but she did not feel that this was a good fit for her -We tried Jardiance  but she developed pelvic abscesses and had to come off -She lost 1 pound before last visit  3. HL -Latest lipid panel was normal in 12/2022: Fractions at goal: Lab Results  Component Value Date   CHOL 156 01/16/2023   HDL 46.30 01/16/2023   LDLCALC 93 01/16/2023   TRIG 81.0 01/16/2023   CHOLHDL 3 01/16/2023  -On rosuvastatin  20 mg daily-no side effects  Lela Fendt, MD PhD [facility] Endocrin[facility]

## 2023-12-21 ENCOUNTER — Other Ambulatory Visit: Payer: Self-pay | Admitting: Family Medicine

## 2023-12-21 ENCOUNTER — Other Ambulatory Visit: Payer: Self-pay | Admitting: Internal Medicine

## 2023-12-21 DIAGNOSIS — I10 Essential (primary) hypertension: Secondary | ICD-10-CM

## 2023-12-21 DIAGNOSIS — Z0000 Encounter for general adult medical examination without abnormal findings: Secondary | ICD-10-CM

## 2024-01-14 ENCOUNTER — Other Ambulatory Visit: Payer: Self-pay | Admitting: Internal Medicine

## 2024-02-11 ENCOUNTER — Other Ambulatory Visit: Payer: Self-pay | Admitting: Orthopaedic Surgery

## 2024-02-19 ENCOUNTER — Ambulatory Visit: Payer: PRIVATE HEALTH INSURANCE | Admitting: Family Medicine

## 2024-02-19 ENCOUNTER — Encounter: Payer: Self-pay | Admitting: Family Medicine

## 2024-02-19 VITALS — BP 150/100 | HR 95 | Temp 97.9°F | Resp 18 | Ht 65.25 in | Wt 232.4 lb

## 2024-02-19 DIAGNOSIS — Z7984 Long term (current) use of oral hypoglycemic drugs: Secondary | ICD-10-CM

## 2024-02-19 DIAGNOSIS — M25551 Pain in right hip: Secondary | ICD-10-CM

## 2024-02-19 DIAGNOSIS — I10 Essential (primary) hypertension: Secondary | ICD-10-CM | POA: Diagnosis not present

## 2024-02-19 DIAGNOSIS — E1165 Type 2 diabetes mellitus with hyperglycemia: Secondary | ICD-10-CM

## 2024-02-19 DIAGNOSIS — E1169 Type 2 diabetes mellitus with other specified complication: Secondary | ICD-10-CM

## 2024-02-19 DIAGNOSIS — K635 Polyp of colon: Secondary | ICD-10-CM

## 2024-02-19 DIAGNOSIS — Z0000 Encounter for general adult medical examination without abnormal findings: Secondary | ICD-10-CM

## 2024-02-19 DIAGNOSIS — E785 Hyperlipidemia, unspecified: Secondary | ICD-10-CM

## 2024-02-19 MED ORDER — DICLOFENAC SODIUM 75 MG PO TBEC: 75.0000 mg | DELAYED_RELEASE_TABLET | Freq: Two times a day (BID) | ORAL | 1 refills | Status: DC | PRN

## 2024-02-19 NOTE — Assessment & Plan Note (Signed)
 Ghm utd Check labs  See AVS  Health Maintenance  Topic Date Due   Colonoscopy  09/22/1980   Zoster Vaccines- Shingrix (1 of 2) Never done   Pneumococcal Vaccine 68-62 Years old (2 of 2 - PCV) 01/24/2014   MAMMOGRAM  04/03/2022   COVID-19 Vaccine (3 - 2024-25 season) 06/28/2023   HEMOGLOBIN A1C  12/28/2023   Diabetic kidney evaluation - eGFR measurement  01/16/2024   Diabetic kidney evaluation - Urine ACR  01/16/2024   INFLUENZA VACCINE  05/27/2024   FOOT EXAM  06/29/2024   OPHTHALMOLOGY EXAM  08/06/2024   DTaP/Tdap/Td (2 - Td or Tdap) 11/14/2029   Hepatitis C Screening  Completed   HIV Screening  Completed   HPV VACCINES  Aged Out   Meningococcal B Vaccine  Aged Out

## 2024-02-19 NOTE — Patient Instructions (Signed)
 Preventive Care 16-62 Years Old, Female  Preventive care refers to lifestyle choices and visits with your health care provider that can promote health and wellness. Preventive care visits are also called wellness exams.  What can I expect for my preventive care visit?  Counseling  Your health care provider may ask you questions about your:  Medical history, including:  Past medical problems.  Family medical history.  Pregnancy history.  Current health, including:  Menstrual cycle.  Method of birth control.  Emotional well-being.  Home life and relationship well-being.  Sexual activity and sexual health.  Lifestyle, including:  Alcohol, nicotine or tobacco, and drug use.  Access to firearms.  Diet, exercise, and sleep habits.  Work and work Astronomer.  Sunscreen use.  Safety issues such as seatbelt and bike helmet use.  Physical exam  Your health care provider will check your:  Height and weight. These may be used to calculate your BMI (body mass index). BMI is a measurement that tells if you are at a healthy weight.  Waist circumference. This measures the distance around your waistline. This measurement also tells if you are at a healthy weight and may help predict your risk of certain diseases, such as type 2 diabetes and high blood pressure.  Heart rate and blood pressure.  Body temperature.  Skin for abnormal spots.  What immunizations do I need?    Vaccines are usually given at various ages, according to a schedule. Your health care provider will recommend vaccines for you based on your age, medical history, and lifestyle or other factors, such as travel or where you work.  What tests do I need?  Screening  Your health care provider may recommend screening tests for certain conditions. This may include:  Lipid and cholesterol levels.  Diabetes screening. This is done by checking your blood sugar (glucose) after you have not eaten for a while (fasting).  Pelvic exam and Pap test.  Hepatitis B test.  Hepatitis C  test.  HIV (human immunodeficiency virus) test.  STI (sexually transmitted infection) testing, if you are at risk.  Lung cancer screening.  Colorectal cancer screening.  Mammogram. Talk with your health care provider about when you should start having regular mammograms. This may depend on whether you have a family history of breast cancer.  BRCA-related cancer screening. This may be done if you have a family history of breast, ovarian, tubal, or peritoneal cancers.  Bone density scan. This is done to screen for osteoporosis.  Talk with your health care provider about your test results, treatment options, and if necessary, the need for more tests.  Follow these instructions at home:  Eating and drinking    Eat a diet that includes fresh fruits and vegetables, whole grains, lean protein, and low-fat dairy products.  Take vitamin and mineral supplements as recommended by your health care provider.  Do not drink alcohol if:  Your health care provider tells you not to drink.  You are pregnant, may be pregnant, or are planning to become pregnant.  If you drink alcohol:  Limit how much you have to 0-1 drink a day.  Know how much alcohol is in your drink. In the [place] one drink equals one 12 oz bottle of beer (355 mL), one 5 oz glass of wine (148 mL), or one 1 oz glass of hard liquor (44 mL).  Lifestyle  Brush your teeth every morning and night with fluoride toothpaste. Floss one time each day.  Exercise for at least  30 minutes 5 or more days each week.  Do not use any products that contain nicotine or tobacco. These products include cigarettes, chewing tobacco, and vaping devices, such as e-cigarettes. If you need help quitting, ask your health care provider.  Do not use drugs.  If you are sexually active, practice safe sex. Use a condom or other form of protection to prevent STIs.  If you do not wish to become pregnant, use a form of birth control. If you plan to become pregnant, see your health care provider for a  prepregnancy visit.  Take aspirin only as told by your health care provider. Make sure that you understand how much to take and what form to take. Work with your health care provider to find out whether it is safe and beneficial for you to take aspirin daily.  Find healthy ways to manage stress, such as:  Meditation, yoga, or listening to music.  Journaling.  Talking to a trusted person.  Spending time with friends and family.  Minimize exposure to UV radiation to reduce your risk of skin cancer.  Safety  Always wear your seat belt while driving or riding in a vehicle.  Do not drive:  If you have been drinking alcohol. Do not ride with someone who has been drinking.  When you are tired or distracted.  While texting.  If you have been using any mind-altering substances or drugs.  Wear a helmet and other protective equipment during sports activities.  If you have firearms in your house, make sure you follow all gun safety procedures.  Seek help if you have been physically or sexually abused.  What's next?  Visit your health care provider once a year for an annual wellness visit.  Ask your health care provider how often you should have your eyes and teeth checked.  Stay up to date on all vaccines.  This information is not intended to replace advice given to you by your health care provider. Make sure you discuss any questions you have with your health care provider.  Document Revised: 04/10/2021 Document Reviewed: 04/10/2021  Elsevier Patient Education  2024 [employer].

## 2024-02-19 NOTE — Progress Notes (Signed)
 Established Patient Office Visit  Subjective   Patient ID: Angela Barr, female    DOB: 12-08-1960  Age: 63 y.o. MRN: [id number]  Chief Complaint  Patient presents with   Annual Exam    Pt states not fasting     HPI Discussed the use of AI scribe software for clinical note transcription with the patient, who gave verbal consent to proceed.  History of Present Illness   History of Present Illness Angela Barr is a 63 year old female with diabetes and hip pain who presents for evaluation of her hip pain and management of her diabetes.  She experiences significant hip pain, primarily in her right hip, which affects her mobility and daily activities. Her left hip is also starting to be affected, but the right hip is prioritized for surgery. She has difficulty getting out of bed without medication and is currently taking diclofenac  for pain management.  She has a history of diabetes and monitors her blood sugar at home. Her blood sugar was 180 mg/dL this morning, which is slightly elevated. She is currently taking glipizide  and metformin  but has discontinued Ozempic  due to gastrointestinal side effects.  She has experienced significant stress recently, including the loss of three family members since February, which she believes may be contributing to her elevated blood pressure. Her blood pressure is usually not as high as it was during this visit.  She has a history of polyps and is due for a colonoscopy, which was previously delayed due to a COVID-19 infection. She has had polyps removed in the past and is on a five-year surveillance schedule.  Her family  history includes her mother who passed away from lung and brain cancer, a brother who died from a suspected pulmonary embolism, and a brother-in-law who also died from lung cancer.  She works in a demanding job that requires significant travel and time on her feet, which is challenging due to her hip pain.   Patient Active Problem List   Diagnosis Date Noted   Unilateral primary osteoarthritis, right knee 02/11/2023   Vaginal sore 01/10/2022   COVID-19 05/28/2021   Class 2 severe obesity with serious comorbidity and body mass index (BMI) of 38.0 to 38.9 in adult (HCC) 03/26/2021   Preventative health care 03/26/2021   Low back pain 03/17/2020   Leg pain, medial 03/17/2020   Dysuria 04/06/2019   Dizzy 11/01/2018   Displaced fracture of fifth metatarsal bone of right foot 03/05/2018   Nasal sore 02/24/2017   DM (diabetes mellitus) type II uncontrolled, periph vascular disorder 03/20/2016   Norovirus 11/04/2015   Cellulitis and abscess of trunk 05/10/2015   Vaginitis and vulvovaginitis 03/19/2015   Serous otitis media 06/30/2013   Class 2 obesity 06/25/2013   Hyperlipidemia 06/25/2013   Hemorrhage of rectum and anus 07/04/2011   History of colonic polyps 07/04/2011   Family history of malignant neoplasm of gastrointestinal tract 07/04/2011   Essential hypertension 06/17/2011   Past Medical History:  Diagnosis Date   Arthritis    bilateral hips   Colon polyps 2008   [place]--Dr [place]   Diabetes mellitus 2006   on meds   Hyperlipemia 2012   on meds   Hypertension 2012   on meds   Obesity    Palpitations    Past Surgical History:  Procedure Laterality Date   BREAST BIOPSY Right 2003   COLONOSCOPY  07/07/2011   RK-F/V-suprep(exc)-TA x 1   POLYPECTOMY     TUBAL LIGATION  1990  VAGINAL HYSTERECTOMY  2006   WISDOM TOOTH EXTRACTION     Social History   Tobacco Use   Smoking status: Never   Smokeless tobacco: Never  Vaping Use   Vaping status: Never Used   Substance Use Topics   Alcohol use: No   Drug use: No   Social History   Socioeconomic History   Marital status: Married    Spouse name: John   Number of children: 3   Years of education: Not on file   Highest education level: Not on file  Occupational History   Occupation: Charity fundraiser  Tobacco Use   Smoking status: Never   Smokeless tobacco: Never  Vaping Use   Vaping status: Never Used  Substance and Sexual Activity   Alcohol use: No   Drug use: No   Sexual activity: Yes    Partners: Male    Birth control/protection: Surgical  Other Topics Concern   Not on file  Social History Narrative   3 caffeine drinks daily.   Exercise-no   Lives at home with husband    Social Drivers of Corporate investment banker Strain: Not on file  Food Insecurity: Not on file  Transportation Needs: Not on file  Physical Activity: Not on file  Stress: Not on file  Social Connections: Not on file  Intimate Partner Violence: Not on file   Family Status  Relation Name Status   Mother  Deceased   Father  Deceased at age 64   Brother  Deceased   MGM  (Not Specified)   PGM  (Not Specified)   Neg Hx  (Not Specified)  No partnership data on file   Family History  Problem Relation Age of Onset   Hypertension Mother    High Cholesterol Mother    Stroke Mother    Heart failure Father    Hypertension Father    Colon cancer Father 5   Diabetes Father    Heart disease Father    Sudden death Father    Obesity Father    Colon polyps Father 66   Pulmonary embolism Brother    Breast cancer Maternal Grandmother    Diabetes Paternal Grandmother    Esophageal cancer Neg Hx    Rectal cancer Neg Hx    Stomach cancer Neg Hx    Allergies  Allergen Reactions   Bactrim Nausea Only   Sulfa Antibiotics Nausea And Vomiting   Sulfamethoxazole-Trimethoprim       Review of Systems  Constitutional:  Negative for fever and malaise/fatigue.  HENT:  Negative for congestion.   Eyes:  Negative for  blurred vision.  Respiratory:  Negative for cough and shortness of breath.   Cardiovascular:  Negative for chest pain, palpitations and leg swelling.  Gastrointestinal:  Negative for abdominal pain, blood in stool, nausea and vomiting.  Genitourinary:  Negative for dysuria and frequency.  Musculoskeletal:  Negative for back pain and falls.  Skin:  Negative for rash.  Neurological:  Negative for dizziness, loss of consciousness and headaches.  Endo/Heme/Allergies:  Negative for environmental allergies.  Psychiatric/Behavioral:  Negative for depression. The patient is not nervous/anxious.       Objective:     BP (!) 150/100 (BP Location: Left Arm, Patient Position: Sitting, Cuff Size: Large)   Pulse 95   Temp 97.9 F (36.6 C) (Oral)   Resp 18   Ht 5' 5.25" (1.657 m)   Wt 232 lb 6.4 oz (105.4 kg)   SpO2 97%   BMI 38.38  kg/m  BP Readings from Last 3 Encounters:  02/19/24 (!) 150/100  06/30/23 124/70  02/24/23 120/72   Wt Readings from Last 3 Encounters:  02/19/24 232 lb 6.4 oz (105.4 kg)  06/30/23 230 lb 12.8 oz (104.7 kg)  05/13/23 228 lb (103.4 kg)   SpO2 Readings from Last 3 Encounters:  02/19/24 97%  06/30/23 97%  02/24/23 97%      Physical Exam Vitals and nursing note reviewed.  Constitutional:      General: She is not in acute distress.    Appearance: Normal appearance. She is well-developed.  HENT:     Head: Normocephalic and atraumatic.     Right Ear: Tympanic membrane, ear canal and external ear normal. There is no impacted cerumen.     Left Ear: Tympanic membrane, ear canal and external ear normal. There is no impacted cerumen.     Nose: Nose normal.     Mouth/Throat:     Mouth: Mucous membranes are moist.     Pharynx: Oropharynx is clear. No oropharyngeal exudate or posterior oropharyngeal erythema.  Eyes:     General: No scleral icterus.       Right eye: No discharge.        Left eye: No discharge.     Conjunctiva/sclera: Conjunctivae normal.      Pupils: Pupils are equal, round, and reactive to light.  Neck:     Thyroid : No thyromegaly or thyroid  tenderness.     Vascular: No JVD.  Cardiovascular:     Rate and Rhythm: Normal rate and regular rhythm.     Heart sounds: Normal heart sounds. No murmur heard. Pulmonary:     Effort: Pulmonary effort is normal. No respiratory distress.     Breath sounds: Normal breath sounds.  Abdominal:     General: Bowel sounds are normal. There is no distension.     Palpations: Abdomen is soft. There is no mass.     Tenderness: There is no abdominal tenderness. There is no guarding or rebound.  Musculoskeletal:        General: Normal range of motion.     Cervical back: Normal range of motion and neck supple.     Right lower leg: No edema.     Left lower leg: No edema.  Lymphadenopathy:     Cervical: No cervical adenopathy.  Skin:    General: Skin is warm and dry.     Findings: No erythema or rash.  Neurological:     Mental Status: She is alert and oriented to person, place, and time.     Cranial Nerves: No cranial nerve deficit.     Deep Tendon Reflexes: Reflexes are normal and symmetric.  Psychiatric:        Mood and Affect: Mood normal.        Behavior: Behavior normal.        Thought Content: Thought content normal.        Judgment: Judgment normal.      No results found for any visits on 02/19/24.  Last CBC Lab Results  Component Value Date   WBC 3.6 (L) 01/16/2023   HGB 12.9 01/16/2023   HCT 40.3 01/16/2023   MCV 80.9 01/16/2023   MCH 26.5 02/13/2022   RDW 16.0 (H) 01/16/2023   PLT 217.0 01/16/2023   Last metabolic panel Lab Results  Component Value Date   GLUCOSE 209 (H) 01/16/2023   NA 138 01/16/2023   K 3.9 01/16/2023   CL 106 01/16/2023  CO2 24 01/16/2023   BUN 10 01/16/2023   CREATININE 0.57 01/16/2023   GFR 98.85 01/16/2023   CALCIUM  8.9 01/16/2023   PROT 6.4 01/16/2023   ALBUMIN 4.0 01/16/2023   BILITOT 0.2 01/16/2023   ALKPHOS 101 01/16/2023   AST 24  01/16/2023   ALT 41 (H) 01/16/2023   ANIONGAP 8 02/13/2022   Last lipids Lab Results  Component Value Date   CHOL 156 01/16/2023   HDL 46.30 01/16/2023   LDLCALC 93 01/16/2023   TRIG 81.0 01/16/2023   CHOLHDL 3 01/16/2023   Last hemoglobin A1c Lab Results  Component Value Date   HGBA1C 7.6 (A) 06/30/2023   Last thyroid  functions Lab Results  Component Value Date   TSH 0.35 03/26/2021   T4TOTAL 7.9 06/03/2019   Last vitamin D  Lab Results  Component Value Date   VD25OH 49.37 11/15/2019   Last vitamin B12 and Folate Lab Results  Component Value Date   VITAMINB12 503 01/23/2020      The 10-year ASCVD risk score (Arnett DK, et al., 2019) is: 21.8%    Assessment & Plan:   Problem List Items Addressed This Visit       Unprioritized   Preventative health care - Primary   Ghm utd Check labs  See AVS  Health Maintenance  Topic Date Due   Colonoscopy  09/22/1980   Zoster Vaccines- Shingrix (1 of 2) Never done   Pneumococcal Vaccine 61-59 Years old (2 of 2 - PCV) 01/24/2014   MAMMOGRAM  04/03/2022   COVID-19 Vaccine (3 - 2024-25 season) 06/28/2023   HEMOGLOBIN A1C  12/28/2023   Diabetic kidney evaluation - eGFR measurement  01/16/2024   Diabetic kidney evaluation - Urine ACR  01/16/2024   INFLUENZA VACCINE  05/27/2024   FOOT EXAM  06/29/2024   OPHTHALMOLOGY EXAM  08/06/2024   DTaP/Tdap/Td (2 - Td or Tdap) 11/14/2029   Hepatitis C Screening  Completed   HIV Screening  Completed   HPV VACCINES  Aged Out   Meningococcal B Vaccine  Aged Out         Relevant Orders   CBC with Differential/Platelet   Comprehensive metabolic panel with GFR   Hemoglobin A1c   Lipid panel   Microalbumin / creatinine urine ratio   Hyperlipidemia   Encourage heart healthy diet such as MIND or DASH diet, increase exercise, avoid trans fats, simple carbohydrates and processed foods, consider a krill or fish or flaxseed oil cap daily.        Essential hypertension   Well  controlled, no changes to meds. Encouraged heart healthy diet such as the DASH diet and exercise as tolerated.        Relevant Orders   CBC with Differential/Platelet   Comprehensive metabolic panel with GFR   Lipid panel   Other Visit Diagnoses       Hyperlipidemia associated with type 2 diabetes mellitus (HCC)       Relevant Orders   Comprehensive metabolic panel with GFR   Lipid panel     Uncontrolled type 2 diabetes mellitus with hyperglycemia (HCC)       Relevant Orders   Comprehensive metabolic panel with GFR   Hemoglobin A1c   Microalbumin / creatinine urine ratio     Right hip pain         Polyp of colon, unspecified part of colon, unspecified type       Relevant Orders   Ambulatory referral to Gastroenterology     Assessment and Plan  Assessment & Plan Osteoarthritis of right hip   Severe osteoarthritis in her right hip is causing significant pain and mobility issues. She is awaiting hip replacement surgery, which is pending better control of her blood glucose levels. Currently, she uses diclofenac  for pain management. An anterior approach is planned for quicker recovery. Refill diclofenac  prescription until surgery. Encourage proceeding with hip replacement for improved quality of life. Discuss the importance of post-operative physical therapy to regain strength and mobility.  Type 2 diabetes mellitus   Her type 2 diabetes mellitus shows a recent blood glucose level of 180 mg/dL. She is currently on glipizide  and metformin , having discontinued Ozempic  due to gastrointestinal side effects. An upcoming endocrinology appointment will address management. Order blood work to assess current blood glucose levels. Refill glipizide  and metformin  prescriptions.  Hypertension   Her blood pressure is elevated today, likely due to stress and pain, though it has not been consistently high in the past. Monitor blood pressure regularly. Consider lifestyle modifications to manage stress and  pain.  Wellness Visit   During her routine wellness visit, health maintenance topics including vaccinations and screenings were discussed. She is hesitant about vaccines but may consider them in the future. Discuss the importance of a colonoscopy due to polyps, as she is overdue. Discuss the benefits of the shingles vaccine, including reduced risk of heart attack and stroke. Discuss the pneumonia vaccine. Reviewed her recent mammogram and confirmed completion. Discuss immunity to varicella and past vaccinations.    No follow-ups on file.    Sailor Haughn R Lowne Chase, DO

## 2024-02-19 NOTE — Assessment & Plan Note (Signed)
 Encourage heart healthy diet such as MIND or DASH diet, increase exercise, avoid trans fats, simple carbohydrates and processed foods, consider a krill or fish or flaxseed oil cap daily.

## 2024-02-19 NOTE — Assessment & Plan Note (Signed)
 Well controlled, no changes to meds. Encouraged heart healthy diet such as the DASH diet and exercise as tolerated.

## 2024-02-20 LAB — COMPREHENSIVE METABOLIC PANEL WITH GFR
AG Ratio: 1.7 (calc) (ref 1.0–2.5)
ALT: 77 U/L — ABNORMAL HIGH (ref 6–29)
AST: 40 U/L — ABNORMAL HIGH (ref 10–35)
Albumin: 4.3 g/dL (ref 3.6–5.1)
Alkaline phosphatase (APISO): 102 U/L (ref 37–153)
BUN: 15 mg/dL (ref 7–25)
CO2: 26 mmol/L (ref 20–32)
Calcium: 9.7 mg/dL (ref 8.6–10.4)
Chloride: 106 mmol/L (ref 98–110)
Creat: 0.54 mg/dL (ref 0.50–1.05)
Globulin: 2.5 g/dL (ref 1.9–3.7)
Glucose, Bld: 173 mg/dL — ABNORMAL HIGH (ref 65–99)
Potassium: 4.3 mmol/L (ref 3.5–5.3)
Sodium: 141 mmol/L (ref 135–146)
Total Bilirubin: 0.2 mg/dL (ref 0.2–1.2)
Total Protein: 6.8 g/dL (ref 6.1–8.1)
eGFR: 105 mL/min/{1.73_m2} (ref >=60)

## 2024-02-20 LAB — LIPID PANEL
Cholesterol: 216 mg/dL — ABNORMAL HIGH (ref <200)
HDL: 51 mg/dL (ref >=50)
LDL Cholesterol (Calc): 136 mg/dL — ABNORMAL HIGH
Non-HDL Cholesterol (Calc): 165 mg/dL — ABNORMAL HIGH (ref <130)
Total CHOL/HDL Ratio: 4.2 (calc) (ref <5.0)
Triglycerides: 157 mg/dL — ABNORMAL HIGH (ref <150)

## 2024-02-20 LAB — CBC WITH DIFFERENTIAL/PLATELET
Absolute Lymphocytes: 2165 {cells}/uL (ref 850–3900)
Absolute Monocytes: 427 {cells}/uL (ref 200–950)
Basophils Absolute: 59 {cells}/uL (ref 0–200)
Basophils Relative: 1.1 %
Eosinophils Absolute: 130 {cells}/uL (ref 15–500)
Eosinophils Relative: 2.4 %
HCT: 38.7 % (ref 35.0–45.0)
Hemoglobin: 12.3 g/dL (ref 11.7–15.5)
MCH: 26.2 pg — ABNORMAL LOW (ref 27.0–33.0)
MCHC: 31.8 g/dL — ABNORMAL LOW (ref 32.0–36.0)
MCV: 82.3 fL (ref 80.0–100.0)
MPV: 13 fL — ABNORMAL HIGH (ref 7.5–12.5)
Monocytes Relative: 7.9 %
Neutro Abs: 2619 {cells}/uL (ref 1500–7800)
Neutrophils Relative %: 48.5 %
Platelets: 246 10*3/uL (ref 140–400)
RBC: 4.7 10*6/uL (ref 3.80–5.10)
RDW: 13.6 % (ref 11.0–15.0)
Total Lymphocyte: 40.1 %
WBC: 5.4 10*3/uL (ref 3.8–10.8)

## 2024-02-20 LAB — HEMOGLOBIN A1C
Hgb A1c MFr Bld: 8.5 % — ABNORMAL HIGH (ref <5.7)
Mean Plasma Glucose: 197 mg/dL
eAG (mmol/L): 10.9 mmol/L

## 2024-02-20 LAB — MICROALBUMIN / CREATININE URINE RATIO
Creatinine, Urine: 160 mg/dL (ref 20–275)
Microalb Creat Ratio: 8 mg/g{creat} (ref <30)
Microalb, Ur: 1.2 mg/dL

## 2024-02-23 ENCOUNTER — Encounter: Payer: Self-pay | Admitting: Internal Medicine

## 2024-02-23 ENCOUNTER — Ambulatory Visit: Payer: PRIVATE HEALTH INSURANCE | Admitting: Internal Medicine

## 2024-02-23 VITALS — BP 136/70 | HR 92 | Ht 65.25 in | Wt 232.8 lb

## 2024-02-23 DIAGNOSIS — E66812 Obesity, class 2: Secondary | ICD-10-CM

## 2024-02-23 DIAGNOSIS — E785 Hyperlipidemia, unspecified: Secondary | ICD-10-CM | POA: Diagnosis not present

## 2024-02-23 DIAGNOSIS — Z7984 Long term (current) use of oral hypoglycemic drugs: Secondary | ICD-10-CM

## 2024-02-23 DIAGNOSIS — E1165 Type 2 diabetes mellitus with hyperglycemia: Secondary | ICD-10-CM | POA: Diagnosis not present

## 2024-02-23 MED ORDER — FREESTYLE LIBRE 3 PLUS SENSOR MISC: 1.0000 | 3 refills | Status: DC

## 2024-02-23 MED ORDER — TIRZEPATIDE 5 MG/0.5ML ~~LOC~~ SOAJ: 5.0000 mg | SUBCUTANEOUS | 3 refills | Status: DC

## 2024-02-23 NOTE — Progress Notes (Signed)
 Patient ID: Angela Barr, female   DOB: [birth year], 63 y.o.   MRN: [id number]  HPI: Angela Barr is a 63 y.o.-year-old female, returning for f/u for DM2, dx in 2007, non-insulin -dependent, uncontrolled, without long term complications, but with hyperglycemia. Last visit 8 months ago.  Interim history: + increased urination, no blurry vision, chest pain. She continues to have hip pain.  She declined steroid injections. To have surgery, she needs an HbA1c of 7% or lower.  She is not able to exercise. She was off Ozempic  x 2 mo b/c mild AP. She had nausea when she was taking it after eating but taking it on an empty stomach helped.  However, she also tells me that she wanted to see if she could manage her blood sugars with oral medications and diet.  She did restart Ozempic  few days ago.  Reviewed HbA1c levels: Lab Results  Component Value Date   HGBA1C 8.5 (H) 02/19/2024   HGBA1C 7.6 (A) 06/30/2023   HGBA1C 8.9 (H) 01/16/2023   HGBA1C 7.6 (A) 10/22/2022   HGBA1C 7.8 (A) 06/19/2022   HGBA1C 6.4 (A) 01/29/2021   HGBA1C 7.0 (A) 09/25/2020   HGBA1C 6.1 (A) 05/15/2020   HGBA1C 7.1 (A) 01/10/2020   HGBA1C 6.4 (A) 09/12/2019   HGBA1C 6.7 (A) 05/12/2019   HGBA1C 7.2 (A) 01/19/2019   HGBA1C 6.6 (A) 09/17/2018   HGBA1C 6.4 (A) 05/14/2018   HGBA1C 7.3 02/08/2018   HGBA1C 6.6 11/11/2017   HGBA1C 6.6 08/11/2017   HGBA1C 6.6 04/01/2017   HGBA1C 6.3 11/14/2016   HGBA1C 6.6 08/15/2016  09/25/2015: HbA1c 7.1%  She is now on: - Metformin  XR 500 mg with lunch and 1000 mg with dinner >> 1500 mg with dinner >> 500 mg in am and 1000 mg with dinner - Glipizide  5 mg before a larger dinner - Victoza  started 2 weeks ago: 0.6 >> 1.2 >> mg in am >> AP >> Rybelsus  7 >> 14 mg daily in am (increased by PCP 12/2022) - tolerated well >> Ozempic  0.5 mg weekly >> off x2 mo - restarted 4 days ago  Stopped glimepiride  2 mg before dinner 08/2020 Stopped Jardiance  due to price.  We then restarted this 05/2022  but developed pelvic abscesses-had to be on antibiotics >> stopped the medication 09/2022 Invokana  caused yeast infections. Stopped Trulicity  1.5 mg >> stopped b/c Nausea and HAs, and also expensive - now tolerating it well, with mild nausea. She was Bydureon  >> lumps at inj. Site.  Victoza  was previously too expensive. Previously on: Ozempic  0.5 mg weekly >> restarted 08/2020 >> 1 mg weekly >> stopped 01/2022 due to abdominal pain.  She is checking sugars 0-1x a day-per review of her meter data: - am: 150-199 >> 125-164 >> 96-190 >> 132-165 >> 124-234 - 2h after b'fast: 191 >> n/c >> 189 >> n/c >> 201 >> n/c - before lunch: 120-130 >> n/c >> 228 >> 140 >> n/c - 2h after lunch: 144-220, 270 >> n/c >> 101-116 >> n/c >> 119, 238 - before dinner:  116 >> n/c >> 85, 98 >> n/c >> 116, 224 - 2h after dinner:  178 >> n/c >> 89 >> n/c - bedtime:  n/c >> 169, 180 >> 123 >> 188 >> 143  >> 223 - nighttime: n/c >> 70s occas. >> 129 >> n/c Lowest sugar was 150 >> 123 >> 96 >> 132 >> 116; she has hypoglycemia awareness in the 90s. Highest sugar was 228 >> 164 >> 190 >> 201 >> 238.  Glucometer: One Touch Verio Flex   Eats sunflower seeds and nuts.  She stopped sodas but changed to sweet tea in the past.  I advised her to also stop this >> now only diet drinks.  No CKD; Last BUN/creatinine:  Lab Results  Component Value Date   BUN 15 02/19/2024   CREATININE 0.54 02/19/2024   Normal ACR levels: Lab Results  Component Value Date   MICRALBCREAT 8 02/19/2024   MICRALBCREAT 1.6 01/16/2023   MICRALBCREAT 0.6 05/14/2018   MICRALBCREAT 0.7 02/05/2016   MICRALBCREAT 0.7 12/25/2014   MICRALBCREAT 2.0 06/13/2014   MICRALBCREAT 0.7 04/04/2013   MICRALBCREAT 0.5 01/30/2012   MICRALBCREAT 0.3 06/17/2011  We stopped lisinopril  as she had low blood pressure and dizziness in the past. On losartan .  + HL; last set of lipids: Lab Results  Component Value Date   CHOL 216 (H) 02/19/2024   HDL 51  02/19/2024   LDLCALC 136 (H) 02/19/2024   TRIG 157 (H) 02/19/2024   CHOLHDL 4.2 02/19/2024  On Crestor  20 daily.  - last eye exam was in 08/07/2023: [facility].  She did have uveitis which was treated with steroid eyedrops.  -She denies numbness and tingling in her feet.  Last foot exam 06/30/2023.  ROS: + see HPI  Past Medical History:  Diagnosis Date   Arthritis    bilateral hips   Colon polyps 2008   [place]--Dr Norwood   Diabetes mellitus 2006   on meds   Hyperlipemia 2012   on meds   Hypertension 2012   on meds   Obesity    Palpitations    Past Surgical History:  Procedure Laterality Date   BREAST BIOPSY Right 2003   COLONOSCOPY  07/07/2011   RK-F/V-suprep(exc)-TA x 1   POLYPECTOMY     TUBAL LIGATION  1990   VAGINAL HYSTERECTOMY  2006   WISDOM TOOTH EXTRACTION     History   Social History   Marital Status: Married    Spouse Name: N/A   Number of Children: 3   Occupational History   RN    Social History Main Topics   Smoking status: Never Smoker    Smokeless tobacco: Never Used   Alcohol Use: No   Drug Use: No   Social History Narrative   3 caffeine drinks daily    Current Outpatient Medications on File Prior to Visit  Medication Sig Dispense Refill   diclofenac  (VOLTAREN ) 75 MG EC tablet Take 1 tablet (75 mg total) by mouth 2 (two) times daily between meals as needed. 60 tablet 1   glipiZIDE  (GLUCOTROL ) 5 MG tablet TAKE 1 TABLET (5 MG TOTAL) BY MOUTH DAILY BEFORE SUPPER. 90 tablet 0   losartan  (COZAAR ) 100 MG tablet Take 1 tablet (100 mg total) by mouth daily. Pt needs office visit for further refills 30 tablet 0   metFORMIN  (GLUCOPHAGE -XR) 500 MG 24 hr tablet TAKE 3 TABLETS (1,500 MG TOTAL) BY MOUTH DAILY WITH BREAKFAST 270 tablet 1   Multiple Vitamin (MULTIVITAMIN ADULT PO) Take by mouth.     ONETOUCH VERIO test strip CHECK YOUR BLOOD SUGAR TWICE DAILY 100 strip 11   rosuvastatin  (CRESTOR ) 20 MG tablet TAKE 1 TABLET BY MOUTH EVERYDAY AT  BEDTIME (Patient taking differently: (Pt states not taking everyday) TAKE 1 TABLET BY MOUTH EVERYDAY AT BEDTIME) 90 tablet 3   VITAMIN D  PO Take 1 tablet by mouth daily at 6 (six) AM.     No current facility-administered medications on file  prior to visit.   Allergies  Allergen Reactions   Bactrim Nausea Only   Sulfa Antibiotics Nausea And Vomiting   Sulfamethoxazole-Trimethoprim    Family History  Problem Relation Age of Onset   Hypertension Mother    High Cholesterol Mother    Stroke Mother    Heart failure Father    Hypertension Father    Colon cancer Father 79   Diabetes Father    Heart disease Father    Sudden death Father    Obesity Father    Colon polyps Father 5   Pulmonary embolism Brother    Breast cancer Maternal Grandmother    Diabetes Paternal Grandmother    Esophageal cancer Neg Hx    Rectal cancer Neg Hx    Stomach cancer Neg Hx    PE: BP 136/70   Pulse 92   Ht 5' 5.25" (1.657 m)   Wt 232 lb 12.8 oz (105.6 kg)   SpO2 98%   BMI 38.44 kg/m   Wt Readings from Last 3 Encounters:  02/23/24 232 lb 12.8 oz (105.6 kg)  02/19/24 232 lb 6.4 oz (105.4 kg)  06/30/23 230 lb 12.8 oz (104.7 kg)   Constitutional: overweight, in NAD Eyes: no exophthalmos ENT: no masses palpated in neck, no cervical lymphadenopathy Cardiovascular: RRR, No MRG Respiratory: CTA B Musculoskeletal: no deformities Skin: no rashes Neurological: no tremor with outstretched hands  ASSESSMENT: 1. DM2, non-insulin -dependent, uncontrolled, without long term complications, but with hyperglycemia  2. Obesity class 2  3. HL  PLAN:  1. Patient with longstanding, uncontrolled, type 2 diabetes, on metformin , weekly GLP-1 receptor agonist, and as needed sulfonylurea before larger meals, with still poor control.  At last visit HbA1c was lower, at 7.6%, but she had another HbA1c obtained 4 days ago and this was higher, at 8.5%. - At last visit sugars were better in the morning but still above  target.  She was not consistently checking later in the day.  Due to the improvement in blood sugars we decided to continue the same regimen but I did suggest to add 1 tablet of sulfonylurea before a larger meal.  Of note, we cannot use SGLT2 inhibitors for her due to history of pelvic abscesses. -At today's visit, she tells me that she was off Ozempic  for approximately 2 months due to mild GI symptoms but also as she wanted to control her diet without the injection.  After her HbA1c returned elevated few days ago, she started back on Ozempic , now tolerated better, but still with mild GI symptoms.  Reviewing her meter download, sugars are fluctuating, without clear patterns, but definitely uncontrolled.  We did discuss about the absolute need to improve diet, do not eat between meals and to limit portions, but I also recommended to change from Ozempic  to Mounjaro for stronger effect on blood sugars and hopefully better tolerance.  We discussed about starting on 2.5 mg weekly but she would prefer to start with a higher dose.  I did send a prescription for the 5 mg dose to the pharmacy and advised her to let me know if she is tolerating it well, so that we can increase the dose further. -Since she does need hip surgery, and the requirement is for the HbA1c to be 7% or lower before this, I suggested insulin , but she declined for now. - I recommended:  Patient Instructions  Please continue: - Metformin  XR 500 mg in am and 1000 mg with dinner - Glipizide  5 mg before a  larger meal  Change from Ozempic  to Mounjaro 5 mg weekly. Let me know if we can increase the dose after 1 month.  Please return in 4 months.   - advised to check sugars at different times of the day - 1x a day, rotating check times - advised for yearly eye exams >> she is UTD - return to clinic in 4 months  2. Obesity class 2 - Had to come off Ozempic  due to abdominal pain in the past, then switched to Rybelsus , but currently back on  Ozempic , but just restarted it after 2 months of being off the medication.  We discussed about trying Mounjaro.  She agrees with this. -She was previously on the [employer] -Of note, she was seen in the [facility] in the past but she did not feel that this was a good fit for her -We tried an SGLT2 inhibitor, Jardiance , but developed pelvic abscesses so she came off Jardiance  2 weeks prior to our last visit.  We continued her off the medication. - Lost 1 pound before last visit and gained 2 pounds since then.  3. HL - Latest lipid panel showed an elevated LDL: Lab Results  Component Value Date   CHOL 216 (H) 02/19/2024   HDL 51 02/19/2024   LDLCALC 136 (H) 02/19/2024   TRIG 157 (H) 02/19/2024   CHOLHDL 4.2 02/19/2024  - She is on Crestor  20 mg daily without side effects.  Emilie Harden, MD PhD [facility] End[facility]

## 2024-02-23 NOTE — Patient Instructions (Addendum)
 Please continue: - Metformin  XR 500 mg in am and 1000 mg with dinner - Glipizide  5 mg before a larger meal  Change from Ozempic  to Mounjaro 5 mg weekly. Let me know if we can increase the dose after 1 month.  Please return in 4 months.

## 2024-02-25 ENCOUNTER — Ambulatory Visit (INDEPENDENT_AMBULATORY_CARE_PROVIDER_SITE_OTHER): Payer: PRIVATE HEALTH INSURANCE | Admitting: Orthopaedic Surgery

## 2024-02-25 VITALS — Ht 65.25 in | Wt 230.0 lb

## 2024-02-25 DIAGNOSIS — M1711 Unilateral primary osteoarthritis, right knee: Secondary | ICD-10-CM | POA: Diagnosis not present

## 2024-02-25 DIAGNOSIS — M25551 Pain in right hip: Secondary | ICD-10-CM | POA: Diagnosis not present

## 2024-02-25 MED ORDER — METHOCARBAMOL 750 MG PO TABS: 750.0000 mg | ORAL_TABLET | Freq: Three times a day (TID) | ORAL | 1 refills | Status: DC | PRN

## 2024-02-25 MED ORDER — NABUMETONE 750 MG PO TABS: 750.0000 mg | ORAL_TABLET | Freq: Two times a day (BID) | ORAL | 1 refills | Status: DC | PRN

## 2024-02-25 NOTE — Progress Notes (Signed)
 The patient is a regular patient of ours.  She is 63 years old and does have significant arthritis of her right hip.  She has been on a weight loss journey and the last time I saw her BMI was around 42 or more and we cannot proceed with any type of joint replacement surgery.  Her BMI today is down to 37.98.  However her hemoglobin A1c just a few days ago was 8.5.  Her primary care physician is working on her blood glucose control.  Medications have been switched recently.  She does state the diclofenac  is not helping her.  She does have pain in the thigh as well.  On exam her right hip does have significant limitations with rotation and significant pain in the groin with rotation.  This is definitely affecting her back and her thigh.  From my standpoint I want her to try Relafen  for her instead of diclofenac  and we will send in some methocarbamol  as well.  She will on working on her blood glucose control.  We would be able to schedule surgery once her A1c is below 7.7 which is used the cutoff for joint replacement surgery.  Will see her in 2 months from now to see how her numbers are looking in order to hopefully be able to schedule her for a right total hip arthroplasty.  She understands that as well.  At her next visit we still need a new weight and BMI calculation as well.

## 2024-02-27 ENCOUNTER — Encounter: Payer: Self-pay | Admitting: Family Medicine

## 2024-02-27 ENCOUNTER — Other Ambulatory Visit: Payer: Self-pay | Admitting: Family Medicine

## 2024-02-27 DIAGNOSIS — R748 Abnormal levels of other serum enzymes: Secondary | ICD-10-CM

## 2024-02-29 ENCOUNTER — Other Ambulatory Visit: Payer: Self-pay | Admitting: Family Medicine

## 2024-03-20 ENCOUNTER — Other Ambulatory Visit: Payer: Self-pay | Admitting: Internal Medicine

## 2024-03-22 MED ORDER — GLIPIZIDE 5 MG PO TABS: 5.0000 mg | ORAL_TABLET | Freq: Every day | ORAL | 0 refills | Status: DC

## 2024-03-22 NOTE — Addendum Note (Signed)
 Addended by: Vernon Goodpasture on: 03/22/2024 08:24 AM   Modules accepted: Orders

## 2024-03-24 ENCOUNTER — Other Ambulatory Visit: Payer: Self-pay | Admitting: Family Medicine

## 2024-03-24 ENCOUNTER — Other Ambulatory Visit: Payer: Self-pay | Admitting: Internal Medicine

## 2024-03-24 DIAGNOSIS — Z0000 Encounter for general adult medical examination without abnormal findings: Secondary | ICD-10-CM

## 2024-03-24 DIAGNOSIS — I10 Essential (primary) hypertension: Secondary | ICD-10-CM

## 2024-03-24 DIAGNOSIS — E0822 Diabetes mellitus due to underlying condition with diabetic chronic kidney disease: Secondary | ICD-10-CM

## 2024-04-18 ENCOUNTER — Encounter: Payer: Self-pay | Admitting: Orthopaedic Surgery

## 2024-04-18 ENCOUNTER — Other Ambulatory Visit: Payer: Self-pay | Admitting: Orthopaedic Surgery

## 2024-04-18 ENCOUNTER — Telehealth: Payer: Self-pay

## 2024-04-18 NOTE — Telephone Encounter (Signed)
 Copied from CRM [id number]. Topic: Clinical - Medical Advice >> Apr 18, 2024  1:40 PM Suzen RAMAN wrote: Reason for CRM: Patient is currently taking Diclofenac  anti inflammatory. Which is causing hair loss, patient would like to know if there is an alternative that she can take. Please contact patient to advise CB# [phone].

## 2024-04-21 MED ORDER — MELOXICAM 15 MG PO TABS: 15.0000 mg | ORAL_TABLET | Freq: Every day | ORAL | 2 refills | Status: DC

## 2024-04-21 NOTE — Addendum Note (Signed)
 Addended by: ELOUISE POWELL HERO on: 04/21/2024 10:40 AM   Modules accepted: Orders

## 2024-04-21 NOTE — Telephone Encounter (Signed)
 Pt called. LDVM. Rx sent

## 2024-04-28 ENCOUNTER — Ambulatory Visit: Payer: PRIVATE HEALTH INSURANCE | Admitting: Orthopaedic Surgery

## 2024-05-27 ENCOUNTER — Other Ambulatory Visit: Payer: Self-pay | Admitting: Orthopaedic Surgery

## 2024-05-27 ENCOUNTER — Other Ambulatory Visit: Payer: Self-pay | Admitting: Internal Medicine

## 2024-06-23 ENCOUNTER — Other Ambulatory Visit: Payer: Self-pay | Admitting: Family Medicine

## 2024-06-23 DIAGNOSIS — I10 Essential (primary) hypertension: Secondary | ICD-10-CM

## 2024-06-23 DIAGNOSIS — Z0000 Encounter for general adult medical examination without abnormal findings: Secondary | ICD-10-CM

## 2024-06-24 ENCOUNTER — Ambulatory Visit: Payer: PRIVATE HEALTH INSURANCE | Admitting: Internal Medicine

## 2024-06-24 ENCOUNTER — Encounter: Payer: Self-pay | Admitting: Internal Medicine

## 2024-06-24 VITALS — BP 124/70 | HR 97 | Ht 65.25 in | Wt 224.4 lb

## 2024-06-24 DIAGNOSIS — E785 Hyperlipidemia, unspecified: Secondary | ICD-10-CM

## 2024-06-24 DIAGNOSIS — E66812 Obesity, class 2: Secondary | ICD-10-CM

## 2024-06-24 DIAGNOSIS — Z7985 Long-term (current) use of injectable non-insulin antidiabetic drugs: Secondary | ICD-10-CM

## 2024-06-24 DIAGNOSIS — Z7984 Long term (current) use of oral hypoglycemic drugs: Secondary | ICD-10-CM

## 2024-06-24 DIAGNOSIS — E1165 Type 2 diabetes mellitus with hyperglycemia: Secondary | ICD-10-CM

## 2024-06-24 LAB — POCT GLYCOSYLATED HEMOGLOBIN (HGB A1C): Hemoglobin A1C: 7.1 % — AB (ref 4.0–5.6)

## 2024-06-24 MED ORDER — DEXCOM G7 SENSOR MISC: 3.0000 | 4 refills | Status: AC

## 2024-06-24 MED ORDER — TIRZEPATIDE 7.5 MG/0.5ML ~~LOC~~ SOAJ
7.5000 mg | SUBCUTANEOUS | 3 refills | Status: AC
Start: 2024-06-24 — End: ?

## 2024-06-24 NOTE — Progress Notes (Signed)
 Patient ID: Angela Barr, female   DOB: [birth year], 63 y.o.   MRN: [id number]  HPI: Angela Barr is a 63 y.o.-year-old female, returning for f/u for DM2, dx in 2007, non-insulin -dependent, uncontrolled, without long term complications, but with hyperglycemia. Last visit 4 months ago.  Interim history: + increased urination (nocturia), no blurry vision, no chest pain. She continues to have hip pain.  She declined steroid injections. To have surgery, she needs an HbA1c of 7% or lower.  She is not able to exercise.  She was able to start Mounjaro  since last visit and she tolerates it well.  Reviewed HbA1c levels: Lab Results  Component Value Date   HGBA1C 8.5 (H) 02/19/2024   HGBA1C 7.6 (A) 06/30/2023   HGBA1C 8.9 (H) 01/16/2023   HGBA1C 7.6 (A) 10/22/2022   HGBA1C 7.8 (A) 06/19/2022   HGBA1C 6.4 (A) 01/29/2021   HGBA1C 7.0 (A) 09/25/2020   HGBA1C 6.1 (A) 05/15/2020   HGBA1C 7.1 (A) 01/10/2020   HGBA1C 6.4 (A) 09/12/2019   HGBA1C 6.7 (A) 05/12/2019   HGBA1C 7.2 (A) 01/19/2019   HGBA1C 6.6 (A) 09/17/2018   HGBA1C 6.4 (A) 05/14/2018   HGBA1C 7.3 02/08/2018   HGBA1C 6.6 11/11/2017   HGBA1C 6.6 08/11/2017   HGBA1C 6.6 04/01/2017   HGBA1C 6.3 11/14/2016   HGBA1C 6.6 08/15/2016  09/25/2015: HbA1c 7.1%  She is now on: - Metformin  XR 500 mg with lunch and 1000 mg with dinner >> 1500 mg with dinner >> 500 mg in am and 1000 mg with dinner - Glipizide  5 mg before a larger dinner - Victoza  started 2 weeks ago: 0.6 >> 1.2 >> mg in am >> AP >> Rybelsus  7 >> 14 mg daily in am (increased by PCP 12/2022) - tolerated well >> Ozempic  0.5 mg weekly >> off >> Mounjaro  2.5 >> 5 mg weekly  Stopped glimepiride  2 mg before dinner 08/2020 Stopped Jardiance  due to price.  We then restarted this 05/2022 but developed pelvic abscesses-had to be on antibiotics >> stopped the medication 09/2022 Invokana  caused yeast infections. Stopped Trulicity  1.5 mg >> stopped b/c Nausea and HAs, and also expensive  - now tolerating it well, with mild nausea. She was Bydureon  >> lumps at inj. Site.  Victoza  was previously too expensive. Previously on: Ozempic  0.5 mg weekly >> restarted 08/2020 >> 1 mg weekly >> stopped 01/2022 due to abdominal pain.  She is checking sugars 0-1x a day: - am: 150-199 >> 125-164 >> 96-190 >> 132-165 >> 124-234 >> 139-153 - 2h after b'fast: 191 >> n/c >> 189 >> n/c >> 201 >> n/c - before lunch: 120-130 >> n/c >> 228 >> 140 >> n/c >> 86 - 2h after lunch: 144-220, 270 >> n/c >> 101-116 >> n/c >> 119, 238 >> 139 - before dinner:  116 >> n/c >> 85, 98 >> n/c >> 116, 224 >> n/c - 2h after dinner:  178 >> n/c >> 89 >> n/c - bedtime:  n/c >> 169, 180 >> 123 >> 188 >> 143  >> 223 >> n/c - nighttime: n/c >> 70s occas. >> 129 >> n/c Lowest sugar was 150 >> 123 >> 96 >> 132 >> 116 >> 86; she has hypoglycemia awareness in the 90s. Highest sugar was 228 >> 164 >> 190 >> 201 >> 238 >> 153.  Glucometer: One Touch Verio Flex   Eats sunflower seeds and nuts.  She stopped sodas but changed to sweet tea in the past.  I advised her to also stop this >>  now only diet drinks.  No CKD; Last BUN/creatinine:  Lab Results  Component Value Date   BUN 15 02/19/2024   CREATININE 0.54 02/19/2024   Normal ACR levels: Lab Results  Component Value Date   MICRALBCREAT 8 02/19/2024  We stopped lisinopril  as she had low blood pressure and dizziness in the past. On losartan .  + HL; last set of lipids: Lab Results  Component Value Date   CHOL 216 (H) 02/19/2024   HDL 51 02/19/2024   LDLCALC 136 (H) 02/19/2024   TRIG 157 (H) 02/19/2024   CHOLHDL 4.2 02/19/2024  On Crestor  20 daily.  - last eye exam was in 08/07/2023: [facility].  She did have uveitis which was treated with steroid eyedrops.  -She denies numbness and tingling in her feet.  Last foot exam 06/30/2023.  ROS: + see HPI  Past Medical History:  Diagnosis Date   Arthritis    bilateral hips   Colon polyps 2008    [place]--Dr Norwood   Diabetes mellitus 2006   on meds   Hyperlipemia 2012   on meds   Hypertension 2012   on meds   Obesity    Palpitations    Past Surgical History:  Procedure Laterality Date   BREAST BIOPSY Right 2003   COLONOSCOPY  07/07/2011   RK-F/V-suprep(exc)-TA x 1   POLYPECTOMY     TUBAL LIGATION  1990   VAGINAL HYSTERECTOMY  2006   WISDOM TOOTH EXTRACTION     History   Social History   Marital Status: Married    Spouse Name: N/A   Number of Children: 3   Occupational History   RN    Social History Main Topics   Smoking status: Never Smoker    Smokeless tobacco: Never Used   Alcohol Use: No   Drug Use: No   Social History Narrative   3 caffeine drinks daily    Current Outpatient Medications on File Prior to Visit  Medication Sig Dispense Refill   Continuous Glucose Sensor (FREESTYLE LIBRE 3 PLUS SENSOR) MISC 1 each by Does not apply route every 14 (fourteen) days. 6 each 3   glipiZIDE  (GLUCOTROL ) 5 MG tablet Take 1 tablet (5 mg total) by mouth daily before supper. 90 tablet 0   losartan  (COZAAR ) 100 MG tablet TAKE 1 TABLET BY MOUTH EVERY DAY 90 tablet 0   meloxicam  (MOBIC ) 15 MG tablet Take 1 tablet (15 mg total) by mouth daily. 30 tablet 2   metFORMIN  (GLUCOPHAGE -XR) 500 MG 24 hr tablet TAKE 3 TABLETS (1,500 MG TOTAL) BY MOUTH DAILY WITH BREAKFAST 270 tablet 1   methocarbamol  (ROBAXIN ) 750 MG tablet TAKE 1 TABLET (750 MG TOTAL) BY MOUTH EVERY 8 (EIGHT) HOURS AS NEEDED FOR MUSCLE SPASMS. 60 tablet 1   Multiple Vitamin (MULTIVITAMIN ADULT PO) Take by mouth.     nabumetone  (RELAFEN ) 750 MG tablet TAKE 1 TABLET BY MOUTH 2 TIMES DAILY AS NEEDED. 60 tablet 1   ONETOUCH VERIO test strip CHECK YOUR BLOOD SUGAR TWICE DAILY 100 strip 11   rosuvastatin  (CRESTOR ) 20 MG tablet TAKE 1 TABLET BY MOUTH EVERYDAY AT BEDTIME (Patient taking differently: (Pt states not taking everyday) TAKE 1 TABLET BY MOUTH EVERYDAY AT BEDTIME) 90 tablet 3   tirzepatide  (MOUNJARO ) 5  MG/0.5ML Pen Inject 5 mg into the skin once a week. 2 mL 3   VITAMIN D  PO Take 1 tablet by mouth daily at 6 (six) AM.     No current facility-administered medications on  file prior to visit.   Allergies  Allergen Reactions   Bactrim Nausea Only   Sulfa Antibiotics Nausea And Vomiting   Sulfamethoxazole-Trimethoprim    Family History  Problem Relation Age of Onset   Hypertension Mother    High Cholesterol Mother    Stroke Mother    Heart failure Father    Hypertension Father    Colon cancer Father 48   Diabetes Father    Heart disease Father    Sudden death Father    Obesity Father    Colon polyps Father 48   Pulmonary embolism Brother    Breast cancer Maternal Grandmother    Diabetes Paternal Grandmother    Esophageal cancer Neg Hx    Rectal cancer Neg Hx    Stomach cancer Neg Hx    PE: BP 124/70   Pulse 97   Ht 5' 5.25 (1.657 m)   Wt 224 lb 6.4 oz (101.8 kg)   SpO2 99%   BMI 37.06 kg/m   Wt Readings from Last 3 Encounters:  06/24/24 224 lb 6.4 oz (101.8 kg)  02/25/24 230 lb (104.3 kg)  02/23/24 232 lb 12.8 oz (105.6 kg)   Constitutional: overweight, in NAD Eyes: no exophthalmos ENT: no masses palpated in neck, no cervical lymphadenopathy Cardiovascular: tachycardia, RR, No MRG Respiratory: CTA B Musculoskeletal: no deformities Skin: no rashes Neurological: no tremor with outstretched hands Diabetic Foot Exam - Simple   Simple Foot Form Diabetic Foot exam was performed with the following findings: Yes 06/24/2024  3:20 PM  Visual Inspection No deformities, no ulcerations, no other skin breakdown bilaterally: Yes Sensation Testing Intact to touch and monofilament testing bilaterally: Yes Pulse Check Posterior Tibialis and Dorsalis pulse intact bilaterally: Yes Comments    ASSESSMENT: 1. DM2, non-insulin -dependent, uncontrolled, without long term complications, but with hyperglycemia  2. Obesity class 2  3. HL  PLAN:  1. Patient with longstanding,  uncontrolled, type 2 diabetes, on metformin , sulfonylurea, and GLP-1/GIP receptor agonist, switched from GLP-1 receptor agonist at last visit.  She was previously on Ozempic , with which she had mild GI symptoms.  At that time her sugars were fluctuating, without clear patterns, but definitely not at goal.  We discussed about the absolute need to improve diet, to not eat between meals and to limit portions, but I also recommended a change from Ozempic  to Mounjaro  for stronger effect on blood sugars and hopefully better tolerance.  I recommended to start at the 2.5 mg dose and increase to the 5 mg dose and then increase it further if tolerated well.  Her HbA1c was 8.5%, higher, at last visit.  Since she needed an HbA1c under 7% for hip surgery, we discussed about insulin  but she declined at that time. -At today's visit, she tells me that she is able to tolerate Mounjaro  well.  She lost 8 pounds since last visit and sugars improved.  Overall, they still remain above target in the morning, possibly also due to the lack of mobility due to hip pain.  We discussed about decreasing the dose of Mounjaro  slightly to 7.5 mg weekly but will continue the rest of the regimen.  We discussed that hopefully she will not need the glipizide  especially after the increase in the Mounjaro  dose. - I recommended:  Patient Instructions  Please continue: - Metformin  XR 500 mg in am and 1000 mg with dinner - Glipizide  5 mg before a larger meal  Increase: - Mounjaro  7.5 mg weekly.  Please return in 4 months.   -  we checked her HbA1c: 7.1% (much better) - advised to check sugars at different times of the day - 1x a day, rotating check times - advised for yearly eye exams >> she is UTD - return to clinic in 4 months  2. Obesity class 2 - Had to come off Ozempic  due to abdominal pain in the past, then switched to Rybelsus , but then went back to Ozempic .  However, at last visit, we discussed about trying Mounjaro  and she agreed  to switch to this.  She started it and tolerating it well. -She was previously on the [employer] -Of note, she was seen in the [facility] in the past but she did not feel that this was a good fit for her -We tried an SGLT2 inhibitor, Jardiance , but developed pelvic abscesses so she came off Jardiance  2 weeks prior to our last visit.  We continued her off the medication. - Weight was approximately stable at the last 2 visits and she lost 8 pounds since last visit after starting Mounjaro .  Will increase the Mounjaro  dose from 5 mg to 7.5 mg weekly at today's visit.  3. HL - Latest lipid panel showed an elevated LDL lipid fractions were at goal, Lab Results  Component Value Date   CHOL 216 (H) 02/19/2024   HDL 51 02/19/2024   LDLCALC 136 (H) 02/19/2024   TRIG 157 (H) 02/19/2024   CHOLHDL 4.2 02/19/2024  - She is on Crestor  20 mg daily without side effects  Lela Fendt, MD PhD [facility] Endocrin[facility]

## 2024-06-24 NOTE — Addendum Note (Signed)
 Addended by: CLEOTILDE ROLIN RAMAN on: 06/24/2024 03:33 PM   Modules accepted: Orders

## 2024-06-24 NOTE — Patient Instructions (Signed)
 Please continue: - Metformin  XR 500 mg in am and 1000 mg with dinner - Glipizide  5 mg before a larger meal  Increase: - Mounjaro  7.5 mg weekly.  Please return in 4 months.

## 2024-08-10 ENCOUNTER — Encounter: Payer: Self-pay | Admitting: Family Medicine

## 2024-08-11 ENCOUNTER — Other Ambulatory Visit: Payer: Self-pay | Admitting: Family Medicine

## 2024-08-11 DIAGNOSIS — M1612 Unilateral primary osteoarthritis, left hip: Secondary | ICD-10-CM

## 2024-08-11 DIAGNOSIS — M1611 Unilateral primary osteoarthritis, right hip: Secondary | ICD-10-CM

## 2024-08-17 ENCOUNTER — Other Ambulatory Visit: Payer: Self-pay

## 2024-08-17 ENCOUNTER — Ambulatory Visit (INDEPENDENT_AMBULATORY_CARE_PROVIDER_SITE_OTHER): Payer: PRIVATE HEALTH INSURANCE | Admitting: Orthopaedic Surgery

## 2024-08-17 VITALS — Ht 65.25 in | Wt 226.0 lb

## 2024-08-17 DIAGNOSIS — M1611 Unilateral primary osteoarthritis, right hip: Secondary | ICD-10-CM | POA: Insufficient documentation

## 2024-08-17 DIAGNOSIS — M1612 Unilateral primary osteoarthritis, left hip: Secondary | ICD-10-CM

## 2024-08-17 DIAGNOSIS — M25551 Pain in right hip: Secondary | ICD-10-CM

## 2024-08-17 MED ORDER — NABUMETONE 750 MG PO TABS: 750.0000 mg | ORAL_TABLET | Freq: Two times a day (BID) | ORAL | 1 refills | Status: DC | PRN

## 2024-08-17 NOTE — Progress Notes (Signed)
 The patient is small well-known to us .  She has severe debilitating arthritis involving her right hip but is now also experiencing left hip pain.  We have had to hold off on surgery while she was on a weight loss journey losing weight with her BMI last year 42.  Also, her hemoglobin A1c with her diabetes was running very high with earlier this year being 8.5.  She is continuing on the weight loss journey and her BMI today is down to 37.32.  Last month her hemoglobin A1c was down to 7.1.  She is ready for joint replacement surgery.  At this point her right hip pain is daily and is definitely affecting her mobility, quality of life and her actives daily living.  She does walk with a Trendelenburg gait as well.  She is not on blood thinning medications.  She does need a refill of Relafen .  She also takes methocarbamol  on occasion.  On exam both hips have significant stiffness and pain with internal and external rotation.  She does walk with a significant limp.  AP pelvis and lateral the right hip today actually shows both hips are now bone-on-bone in terms of complete loss of the superolateral joint space and osteophytes as well as sclerotic changes.  This is significantly worsened when compared to films from last year.  I am ready with scheduling her for right total hip arthroplasty surgery.  I did talk in length in detail about the surgery and described the risks and benefits of surgery.  She will continue on her weight loss journey and her good blood glucose control but we are fine with going ahead and scheduling her for a right total hip arthroplasty in the near future.

## 2024-08-25 ENCOUNTER — Telehealth: Payer: Self-pay

## 2024-08-25 NOTE — Telephone Encounter (Signed)
 I called patient to discuss scheduling right THA.  Left voice mail message for return call.

## 2024-08-29 ENCOUNTER — Encounter: Payer: Self-pay | Admitting: Radiology

## 2024-08-31 ENCOUNTER — Ambulatory Visit: Payer: PRIVATE HEALTH INSURANCE | Admitting: Orthopaedic Surgery

## 2024-08-31 ENCOUNTER — Encounter: Payer: Self-pay | Admitting: Orthopaedic Surgery

## 2024-09-01 ENCOUNTER — Ambulatory Visit: Payer: PRIVATE HEALTH INSURANCE | Admitting: Rehabilitation

## 2024-09-18 ENCOUNTER — Other Ambulatory Visit: Payer: Self-pay | Admitting: Family Medicine

## 2024-09-18 DIAGNOSIS — Z0000 Encounter for general adult medical examination without abnormal findings: Secondary | ICD-10-CM

## 2024-09-18 DIAGNOSIS — I10 Essential (primary) hypertension: Secondary | ICD-10-CM

## 2024-09-19 ENCOUNTER — Other Ambulatory Visit: Payer: Self-pay | Admitting: Physician Assistant

## 2024-09-19 DIAGNOSIS — Z01818 Encounter for other preprocedural examination: Secondary | ICD-10-CM

## 2024-09-23 ENCOUNTER — Other Ambulatory Visit: Payer: Self-pay | Admitting: Orthopaedic Surgery

## 2024-10-05 ENCOUNTER — Other Ambulatory Visit: Payer: Self-pay | Admitting: Physician Assistant

## 2024-10-05 DIAGNOSIS — Z01818 Encounter for other preprocedural examination: Secondary | ICD-10-CM

## 2024-10-11 ENCOUNTER — Encounter: Payer: Self-pay | Admitting: Orthopaedic Surgery

## 2024-10-12 ENCOUNTER — Encounter (HOSPITAL_COMMUNITY): Payer: Self-pay

## 2024-10-12 NOTE — Progress Notes (Signed)
 PCP - Jamee Schultze, DO Cardiologist - none Endocrinology - Dr Lela Fendt  Chest x-ray - n/a EKG - 10/13/24 Stress Test - n/a ECHO - n/a Cardiac Cath - n/a  ICD Pacemaker/Loop - n/a  Sleep Study -  n/a  Diabetes Type 2 Dexcom G7 Systerm. Sensor on     arm Fasting Blood Sugar -  Checks Blood Sugar _____ times a day  Do not take Metformin  on the morning of surgery.  THE NIGHT BEFORE SURGERY, do not take evening dose of Glipizide .      Hold Mounjaro  for 7 days prior to procedure.  Last dose will be on or before 10/10/24.  If your blood sugar is less than 70 mg/dL, you will need to treat for low blood sugar: Treat a low blood sugar (less than 70 mg/dL) with  cup of clear juice (cranberry or apple), 4 glucose tablets, OR glucose gel. Recheck blood sugar in 15 minutes after treatment (to make sure it is greater than 70 mg/dL). If your blood sugar is not greater than 70 mg/dL on recheck, call [phone] for further instructions.  Aspirin & Blood Thinner Instructions:  n/a  ERAS - clear liquids til 0430 DOS. PRE-SURGERY G2 drink given with instructions.for DOS   Anesthesia review: Yes  STOP now taking any Aspirin (unless otherwise instructed by your surgeon), Aleve, Naproxen, Relafen , Ibuprofen, Motrin, Advil, Goody's, BC's, all herbal medications, fish oil, and all vitamins.   Coronavirus Screening Do you have any of the following symptoms:  Cough yes/no: No Fever (>100.5F)  yes/no: No Runny nose yes/no: No Sore throat yes/no: No Difficulty breathing/shortness of breath  yes/no: No  Have you traveled in the last 14 days and where? yes/no: No  Patient verbalized understanding of instructions that were given to them at the PAT appointment. Patient was also instructed that they will need to review over the PAT instructions again at home before surgery.

## 2024-10-12 NOTE — Pre-Procedure Instructions (Signed)
 Surgical Instructions   Your procedure is scheduled on October 18, 2024. Report to [facility] Main Entrance A at 5:30 A.M., then check in with the Admitting office. Any questions or running late day of surgery: call [phone]  Questions prior to your surgery date: call [phone], Monday-Friday, 8am-4pm. If you experience any cold or flu symptoms such as cough, fever, chills, shortness of breath, etc. between now and your scheduled surgery, please notify us  at the above number.     Remember:  Do not eat after midnight the night before your surgery  You may drink clear liquids until 4:30 AM the morning of your surgery.   Clear liquids allowed are: Water, Non-Citrus Juices (without pulp), Carbonated Beverages, Clear Tea (no milk, honey, etc.), Black Coffee Only (NO MILK, CREAM OR POWDERED CREAMER of any kind), and Gatorade.   Patient Instructions  The night before surgery:  No food after midnight. ONLY clear liquids after midnight  The day of surgery (if you have diabetes): Drink ONE (1) 12 oz G2 given to you in your pre admission testing appointment by 4:30 AM the morning of surgery. Drink in one sitting. Do not sip.  This drink was given to you during your hospital  pre-op appointment visit.  Nothing else to drink after completing the  12 oz bottle of G2.         If you have questions, please contact your surgeons office.    Take these medicines the morning of surgery with A SIP OF WATER: NONE   May take these medicines IF NEEDED: acetaminophen  (TYLENOL )  methocarbamol  (ROBAXIN )    One week prior to surgery, STOP taking any Aspirin (unless otherwise instructed by your surgeon) Aleve, Naproxen, Ibuprofen, Motrin, Advil, Goody's, BC's, all herbal medications, fish oil, and non-prescription vitamins. This includes your medication: nabumetone  (RELAFEN )    WHAT DO I DO ABOUT MY DIABETES MEDICATION?   Do not take metFORMIN  (GLUCOPHAGE -XR) the morning of  surgery.  STOP taking your tirzepatide  (MOUNJARO ) one week prior to surgery. Do not take any doses after December 15th.       HOW TO MANAGE YOUR DIABETES BEFORE AND AFTER SURGERY  Why is it important to control my blood sugar before and after surgery? Improving blood sugar levels before and after surgery helps healing and can limit problems. A way of improving blood sugar control is eating a healthy diet by:  Eating less sugar and carbohydrates  Increasing activity/exercise  Talking with your doctor about reaching your blood sugar goals High blood sugars (greater than 180 mg/dL) can raise your risk of infections and slow your recovery, so you will need to focus on controlling your diabetes during the weeks before surgery. Make sure that the doctor who takes care of your diabetes knows about your planned surgery including the date and location.  How do I manage my blood sugar before surgery? Check your blood sugar at least 4 times a day, starting 2 days before surgery, to make sure that the level is not too high or low.  Check your blood sugar the morning of your surgery when you wake up and every 2 hours until you get to the Short Stay unit.  If your blood sugar is less than 70 mg/dL, you will need to treat for low blood sugar: Do not take insulin . Treat a low blood sugar (less than 70 mg/dL) with  cup of clear juice (cranberry or apple), 4 glucose tablets, OR glucose gel. Recheck blood sugar in 15 minutes  after treatment (to make sure it is greater than 70 mg/dL). If your blood sugar is not greater than 70 mg/dL on recheck, call [phone] for further instructions. Report your blood sugar to the short stay nurse when you get to Short Stay.  If you are admitted to the hospital after surgery: Your blood sugar will be checked by the staff and you will probably be given insulin  after surgery (instead of oral diabetes medicines) to make sure you have good blood sugar levels. The goal for  blood sugar control after surgery is 80-180 mg/dL.                      Do NOT Smoke (Tobacco/Vaping) for 24 hours prior to your procedure.  If you use a CPAP at night, you may bring your mask/headgear for your overnight stay.   You will be asked to remove any contacts, glasses, piercing's, hearing aid's, dentures/partials prior to surgery. Please bring cases for these items if needed.    Patients discharged the day of surgery will not be allowed to drive home, and someone needs to stay with them for 24 hours.  SURGICAL WAITING ROOM VISITATION Patients may have no more than 2 support people in the waiting area - these visitors may rotate.   Pre-op nurse will coordinate an appropriate time for 1 ADULT support person, who may not rotate, to accompany patient in pre-op.  Children under the age of 37 must have an adult with them who is not the patient and must remain in the main waiting area with an adult.  If the patient needs to stay at the hospital during part of their recovery, the visitor guidelines for inpatient rooms apply.  Please refer to the [facility] website for the visitor guidelines for any additional information.   If you received a COVID test during your pre-op visit  it is requested that you wear a mask when out in public, stay away from anyone that may not be feeling well and notify your surgeon if you develop symptoms. If you have been in contact with anyone that has tested positive in the last 10 days please notify you surgeon.      Pre-operative 4 CHG Bathing Instructions   You can play a key role in reducing the risk of infection after surgery. Your skin needs to be as free of germs as possible. You can reduce the number of germs on your skin by washing with CHG (chlorhexidine gluconate) soap before surgery. CHG is an antiseptic soap that kills germs and continues to kill germs even after washing.   DO NOT use if you have an allergy to chlorhexidine/CHG or  antibacterial soaps. If your skin becomes reddened or irritated, stop using the CHG and notify one of our RNs at [phone].   Please shower with the CHG soap starting 4 days before surgery using the following schedule:     Please keep in mind the following:  DO NOT shave, including legs and underarms, starting the day of your first shower.   You may shave your face at any point before/day of surgery.  Place clean sheets on your bed the day you start using CHG soap. Use a clean washcloth (not used since being washed) for each shower. DO NOT sleep with pets once you start using the CHG.   CHG Shower Instructions:  Wash your face and private area with normal soap. If you choose to wash your hair, wash first with your  normal shampoo.  After you use shampoo/soap, rinse your hair and body thoroughly to remove shampoo/soap residue.  Turn the water OFF and apply  bottle of CHG soap to a CLEAN washcloth.  Apply CHG soap ONLY FROM YOUR NECK DOWN TO YOUR TOES (washing for 3-5 minutes)  DO NOT use CHG soap on face, private areas, open wounds, or sores.  Pay special attention to the area where your surgery is being performed.  If you are having back surgery, having someone wash your back for you may be helpful. Wait 2 minutes after CHG soap is applied, then you may rinse off the CHG soap.  Pat dry with a clean towel  Put on clean clothes/pajamas   If you choose to wear lotion, please use ONLY the CHG-compatible lotions that are listed below.  Additional instructions for the day of surgery:  If you choose, you may shower the morning of surgery with an antibacterial soap.  DO NOT APPLY any lotions, deodorants, cologne, or perfumes.   Do not bring valuables to the hospital. [facility] is not responsible for any belongings/valuables. Do not wear nail polish, gel polish, artificial nails, or any other type of covering on natural nails (fingers and toes) Do not wear jewelry or makeup Put on  clean/comfortable clothes.  Please brush your teeth.  Ask your nurse before applying any prescription medications to the skin.     CHG Compatible Lotions   Aveeno Moisturizing lotion  Cetaphil Moisturizing Cream  Cetaphil Moisturizing Lotion  Clairol Herbal Essence Moisturizing Lotion, Dry Skin  Clairol Herbal Essence Moisturizing Lotion, Extra Dry Skin  Clairol Herbal Essence Moisturizing Lotion, Normal Skin  Curel Age Defying Therapeutic Moisturizing Lotion with Alpha Hydroxy  Curel Extreme Care Body Lotion  Curel Soothing Hands Moisturizing Hand Lotion  Curel Therapeutic Moisturizing Cream, Fragrance-Free  Curel Therapeutic Moisturizing Lotion, Fragrance-Free  Curel Therapeutic Moisturizing Lotion, Original Formula  Eucerin Daily Replenishing Lotion  Eucerin Dry Skin Therapy Plus Alpha Hydroxy Crme  Eucerin Dry Skin Therapy Plus Alpha Hydroxy Lotion  Eucerin Original Crme  Eucerin Original Lotion  Eucerin Plus Crme Eucerin Plus Lotion  Eucerin TriLipid Replenishing Lotion  Keri Anti-Bacterial Hand Lotion  Keri Deep Conditioning Original Lotion Dry Skin Formula Softly Scented  Keri Deep Conditioning Original Lotion, Fragrance Free Sensitive Skin Formula  Keri Lotion Fast Absorbing Fragrance Free Sensitive Skin Formula  Keri Lotion Fast Absorbing Softly Scented Dry Skin Formula  Keri Original Lotion  Keri Skin Renewal Lotion Keri Silky Smooth Lotion  Keri Silky Smooth Sensitive Skin Lotion  Nivea Body Creamy Conditioning Oil  Nivea Body Extra Enriched Lotion  Nivea Body Original Lotion  Nivea Body Sheer Moisturizing Lotion Nivea Crme  Nivea Skin Firming Lotion  NutraDerm 30 Skin Lotion  NutraDerm Skin Lotion  NutraDerm Therapeutic Skin Cream  NutraDerm Therapeutic Skin Lotion  ProShield Protective Hand Cream  Provon moisturizing lotion  Please read over the following fact sheets that you were given.

## 2024-10-13 ENCOUNTER — Inpatient Hospital Stay (HOSPITAL_COMMUNITY)
Admission: RE | Admit: 2024-10-13 | Discharge: 2024-10-13 | Disposition: A | Discharge status: Home / Self Care | Payer: PRIVATE HEALTH INSURANCE | Source: Ambulatory Visit

## 2024-10-15 ENCOUNTER — Other Ambulatory Visit: Payer: Self-pay | Admitting: Internal Medicine

## 2024-10-15 DIAGNOSIS — E0822 Diabetes mellitus due to underlying condition with diabetic chronic kidney disease: Secondary | ICD-10-CM

## 2024-10-17 ENCOUNTER — Encounter: Payer: PRIVATE HEALTH INSURANCE | Admitting: Orthopaedic Surgery

## 2024-10-17 NOTE — Telephone Encounter (Signed)
 RX addended to reflect most recent notes

## 2024-10-18 ENCOUNTER — Ambulatory Visit (HOSPITAL_COMMUNITY)
Admission: RE | Admit: 2024-10-18 | Payer: PRIVATE HEALTH INSURANCE | Source: Home / Self Care | Admitting: Orthopaedic Surgery

## 2024-10-18 ENCOUNTER — Encounter (HOSPITAL_COMMUNITY): Admission: RE | Payer: Self-pay | Source: Home / Self Care

## 2024-10-18 SURGERY — ARTHROPLASTY, HIP, TOTAL, ANTERIOR APPROACH
Anesthesia: Spinal | Site: Hip | Laterality: Right

## 2024-10-24 ENCOUNTER — Encounter: Payer: Self-pay | Admitting: Internal Medicine

## 2024-10-24 ENCOUNTER — Ambulatory Visit: Payer: PRIVATE HEALTH INSURANCE | Admitting: Internal Medicine

## 2024-10-24 VITALS — BP 138/80 | HR 94 | Ht 65.25 in | Wt 225.6 lb

## 2024-10-24 DIAGNOSIS — Z7985 Long-term (current) use of injectable non-insulin antidiabetic drugs: Secondary | ICD-10-CM

## 2024-10-24 DIAGNOSIS — E1165 Type 2 diabetes mellitus with hyperglycemia: Secondary | ICD-10-CM

## 2024-10-24 DIAGNOSIS — E785 Hyperlipidemia, unspecified: Secondary | ICD-10-CM

## 2024-10-24 DIAGNOSIS — Z7984 Long term (current) use of oral hypoglycemic drugs: Secondary | ICD-10-CM

## 2024-10-24 DIAGNOSIS — E66812 Obesity, class 2: Secondary | ICD-10-CM | POA: Diagnosis not present

## 2024-10-24 LAB — POCT GLYCOSYLATED HEMOGLOBIN (HGB A1C): Hemoglobin A1C: 7.1 % — AB (ref 4.0–5.6)

## 2024-10-24 MED ORDER — GLIPIZIDE 5 MG PO TABS: 5.0000 mg | ORAL_TABLET | Freq: Every day | ORAL | 3 refills | Status: AC

## 2024-10-24 NOTE — Patient Instructions (Addendum)
 Please continue: - Metformin  XR 500 mg in am and 1000 mg with dinner - Mounjaro  7.5 mg weekly  Use: - Glipizide  5 mg before dinner  Try to schedule a new eye exam.  Please return in 4 months.

## 2024-10-24 NOTE — Progress Notes (Signed)
 Patient ID: Angela Barr, female   DOB: Jun 09, 1961, 63 y.o.   MRN: [id number]  HPI: Angela Barr is a 63 y.o.-year-old female, returning for f/u for DM2, dx in 2007, non-insulin -dependent, uncontrolled, without long term complications, but with hyperglycemia. Last visit 4 months ago.  Interim history: + increased urination (nocturia), no blurry vision, no chest pain, no nausea. She continues to have hip pain and will need THR.  She declined steroid injections. To have surgery, she needs an HbA1c of 7% or lower.  She is not able to exercise. She was not able to have the surgery 2/2 having the flu. She was off Mounjaro  but restarted it. She will again need to stay off Mounjaro  for 10 days before the sx at the end of next mo. however, she mentions that she is not completely decided whether to go ahead with the surgery or not.  Reviewed HbA1c levels: Lab Results  Component Value Date   HGBA1C 7.1 (A) 06/24/2024   HGBA1C 8.5 (H) 02/19/2024   HGBA1C 7.6 (A) 06/30/2023   HGBA1C 8.9 (H) 01/16/2023   HGBA1C 7.6 (A) 10/22/2022   HGBA1C 7.8 (A) 06/19/2022   HGBA1C 6.4 (A) 01/29/2021   HGBA1C 7.0 (A) 09/25/2020   HGBA1C 6.1 (A) 05/15/2020   HGBA1C 7.1 (A) 01/10/2020   HGBA1C 6.4 (A) 09/12/2019   HGBA1C 6.7 (A) 05/12/2019   HGBA1C 7.2 (A) 01/19/2019   HGBA1C 6.6 (A) 09/17/2018   HGBA1C 6.4 (A) 05/14/2018   HGBA1C 7.3 02/08/2018   HGBA1C 6.6 11/11/2017   HGBA1C 6.6 08/11/2017   HGBA1C 6.6 04/01/2017   HGBA1C 6.3 11/14/2016  09/25/2015: HbA1c 7.1%  She is now on: - Metformin  XR 500 mg with lunch and 1000 mg with dinner >> 1500 mg with dinner >> 500 mg in am and 1000 mg with dinner - Glipizide  5 mg before a larger dinner - Victoza  started 2 weeks ago: 0.6 >> 1.2 >> mg in am >> AP >> Rybelsus  7 >> 14 mg daily in am (increased by PCP 12/2022) - tolerated well >> Ozempic  0.5 mg weekly >> off >> Mounjaro  2.5 >> 5 >> 7.5 mg weekly  Stopped glimepiride  2 mg before dinner 08/2020 Stopped  Jardiance  due to price.  We then restarted this 05/2022 but developed pelvic abscesses-had to be on antibiotics >> stopped the medication 09/2022 Invokana  caused yeast infections. Stopped Trulicity  1.5 mg >> stopped b/c Nausea and HAs, and also expensive - now tolerating it well, with mild nausea. She was Bydureon  >> lumps at inj. Site.  Victoza  was previously too expensive. Previously on: Ozempic  0.5 mg weekly >> restarted 08/2020 >> 1 mg weekly >> stopped 01/2022 due to abdominal pain.  She is checking sugars 0-1x a day: - am: 96-190 >> 132-165 >> 124-234 >> 139-153 >> 143-180 - 2h after b'fast: 189 >> n/c >> 201 >> n/c - before lunch: n/c >> 228 >> 140 >> n/c >> 86 >> 165 - 2h after lunch: 101-116 >> n/c >> 119, 238 >> 139 >> n/c - before dinner:  85, 98 >> n/c >> 116, 224 >> n/c >> 125-144 - 2h after dinner:  178 >> n/c >> 89 >> n/c - bedtime: 123 >> 188 >> 143  >> 223 >> n/c - nighttime: n/c >> 70s occas. >> 129 >> n/c Lowest sugar was 96 >> 132 >> 116 >> 86; she has hypoglycemia awareness in the 90s. Highest sugar was 238 >> 153.  Glucometer: [facility] sunflower seeds  and nuts.  She stopped sodas but changed to sweet tea in the past.  I advised her to also stop this >> now only diet drinks.  No CKD; Last BUN/creatinine:  Lab Results  Component Value Date   BUN 15 02/19/2024   CREATININE 0.54 02/19/2024   Normal ACR levels: Lab Results  Component Value Date   MICRALBCREAT 8 02/19/2024  We stopped lisinopril  as she had low blood pressure and dizziness in the past. On losartan .  + HL; last set of lipids: Lab Results  Component Value Date   CHOL 216 (H) 02/19/2024   HDL 51 02/19/2024   LDLCALC 136 (H) 02/19/2024   TRIG 157 (H) 02/19/2024   CHOLHDL 4.2 02/19/2024  On Crestor  20 daily.  - last eye exam was in 08/07/2023: No [facility].  She did have uveitis which was treated with steroid eyedrops.  -She denies numbness and tingling in her feet.   Last foot exam 06/24/2024.  ROS: + see HPI  Past Medical History:  Diagnosis Date   Arthritis    bilateral hips   Colon polyps 2008   [place]--Dr Norwood   Diabetes mellitus 2006   on meds   Hyperlipemia 2012   on meds   Hypertension 2012   on meds   Obesity    Palpitations    Past Surgical History:  Procedure Laterality Date   BREAST BIOPSY Right 2003   COLONOSCOPY  07/07/2011   RK-F/V-suprep(exc)-TA x 1   POLYPECTOMY     TUBAL LIGATION  1990   VAGINAL HYSTERECTOMY  2006   WISDOM TOOTH EXTRACTION     History   Social History   Marital Status: Married    Spouse Name: N/A   Number of Children: 3   Occupational History   RN    Social History Main Topics   Smoking status: Never Smoker    Smokeless tobacco: Never Used   Alcohol Use: No   Drug Use: No   Social History Narrative   3 caffeine drinks daily    Current Outpatient Medications on File Prior to Visit  Medication Sig Dispense Refill   acetaminophen  (TYLENOL ) 650 MG CR tablet Take 650 mg by mouth every 8 (eight) hours as needed for pain.     Ascorbic Acid (VITAMIN C) 500 MG CAPS Take 1 tablet by mouth daily.     Continuous Glucose Sensor (DEXCOM G7 SENSOR) MISC 3 each by Does not apply route every 30 (thirty) days. Apply 1 sensor every 10 days 9 each 4   glipiZIDE  (GLUCOTROL ) 5 MG tablet Take 1 tablet (5 mg total) by mouth daily before supper. (Patient not taking: Reported on 09/29/2024) 90 tablet 0   losartan  (COZAAR ) 100 MG tablet TAKE 1 TABLET BY MOUTH EVERY DAY 90 tablet 0   metFORMIN  (GLUCOPHAGE -XR) 500 MG 24 hr tablet Take 3 tablets (1,500 mg total) by mouth daily with breakfast. (500 mg in am and 1000 mg with dinner) 270 tablet 1   methocarbamol  (ROBAXIN ) 750 MG tablet TAKE 1 TABLET (750 MG TOTAL) BY MOUTH EVERY 8 (EIGHT) HOURS AS NEEDED FOR MUSCLE SPASMS. 60 tablet 1   Moringa Oleifera (MORINGA PO) Take 2 tablets by mouth daily.     Multiple Vitamin (MULTIVITAMIN ADULT PO) Take by mouth.      nabumetone  (RELAFEN ) 750 MG tablet Take 1 tablet (750 mg total) by mouth 2 (two) times daily as needed. 60 tablet 1   ONETOUCH VERIO test strip CHECK YOUR BLOOD SUGAR TWICE  DAILY 100 strip 11   OVER THE COUNTER MEDICATION Apply 1 Application topically daily. [employer] deep penetrating pain relief cream     rosuvastatin  (CRESTOR ) 20 MG tablet TAKE 1 TABLET BY MOUTH EVERYDAY AT BEDTIME (Patient not taking: No sig reported) 90 tablet 3   tirzepatide  (MOUNJARO ) 7.5 MG/0.5ML Pen Inject 7.5 mg into the skin once a week. 6 mL 3   VITAMIN D  PO Take 1 tablet by mouth daily at 6 (six) AM.     Vitamin D -Vitamin K (VITAMIN K2-VITAMIN D3 PO) Take 1 tablet by mouth daily.     No current facility-administered medications on file prior to visit.   Allergies  Allergen Reactions   Bactrim Nausea Only   Sulfa Antibiotics Nausea And Vomiting   Sulfamethoxazole-Trimethoprim    Family History  Problem Relation Age of Onset   Hypertension Mother    High Cholesterol Mother    Stroke Mother    Heart failure Father    Hypertension Father    Colon cancer Father 27   Diabetes Father    Heart disease Father    Sudden death Father    Obesity Father    Colon polyps Father 71   Pulmonary embolism Brother    Breast cancer Maternal Grandmother    Diabetes Paternal Grandmother    Esophageal cancer Neg Hx    Rectal cancer Neg Hx    Stomach cancer Neg Hx    PE: BP 138/80   Pulse 94   Ht 5' 5.25 (1.657 m)   Wt 225 lb 9.6 oz (102.3 kg)   SpO2 94%   BMI 37.25 kg/m   Wt Readings from Last 3 Encounters:  10/24/24 225 lb 9.6 oz (102.3 kg)  08/17/24 226 lb (102.5 kg)  06/24/24 224 lb 6.4 oz (101.8 kg)   Constitutional: overweight, in NAD, walks with a cane Eyes: no exophthalmos ENT: no masses palpated in neck, no cervical lymphadenopathy Cardiovascular: Tachycardia, RR, No MRG Respiratory: CTA B Musculoskeletal: no deformities Skin: no rashes Neurological: no tremor with outstretched  hands  ASSESSMENT: 1. DM2, non-insulin -dependent, uncontrolled, without long term complications, but with hyperglycemia  2. Obesity class 2  3. HL  PLAN:  1. Patient with longstanding, uncontrolled, type 2 diabetes, on metformin , sulfonylurea and GLP-1/GIP receptor agonist, dose increased at last visit.  She was previously on Ozempic , on which she had mild GI symptoms.  She tolerates Mounjaro  well.  At last visit, sugars were still above target and we discussed increasing the dose slightly.  Will continue the rest of the regimen.  HbA1c at last visit was lower, at 7.1%. -At today's visit, sugars are high in the morning and they improve later in the day, before dinner.  Upon questioning, she has not been taking glipizide  before larger meals.  Since sugars in the morning are elevated and she does mention that she has increased urination throughout the night, it is likely that her blood sugars increase after dinner and stay elevated during the night.  I advised her to start taking the glipizide  before every dinner.  Another option would be basal insulin , but would like to avoid this for now if possible.  She does have an intolerance to other medications like SGLT2 inhibitors. - I recommended:  Patient Instructions  Please continue: - Metformin  XR 500 mg in am and 1000 mg with dinner - Mounjaro  7.5 mg weekly  Use: - Glipizide  5 mg before dinner  Try to schedule a new eye exam.  Please return in  4 months.   - we checked her HbA1c: 7.1% (stable) - advised to check sugars at different times of the day - 1x a day, rotating check times - advised for yearly eye exams >> she is not UTD - return to clinic in 4 months  2. Obesity class 2 - Had to come off Ozempic  due to abdominal pain in the past, then switched to Rybelsus , but then went back to Ozempic .  However, at last visit, we discussed about trying Mounjaro  and she agreed to switch to this.  She started it and tolerating it well. -She was  previously on the [employer] -Of note, she was seen in the [facility] in the past but she did not feel that this was a good fit for her -We tried an SGLT2 inhibitor, Jardiance , but developed pelvic abscesses so she came off Jardiance  2 weeks prior to our last visit.  We continued her off the medication. - She lost 8 pounds after starting Mounjaro .  At last visit we increased her Mounjaro  dose.  She tolerates it well.  3. HL - Latest lipid panel from 01/2024 showed LDL above target, otherwise fractions at goal: Lab Results  Component Value Date   CHOL 216 (H) 02/19/2024   HDL 51 02/19/2024   LDLCALC 136 (H) 02/19/2024   TRIG 157 (H) 02/19/2024   CHOLHDL 4.2 02/19/2024  - She was on Crestor  20 mg daily but at today's visit she mentions that she is not taking it.  Lela Fendt, MD PhD [facility] Endocrinology.  Will hold

## 2024-10-31 ENCOUNTER — Encounter: Payer: PRIVATE HEALTH INSURANCE | Admitting: Orthopaedic Surgery

## 2024-11-10 ENCOUNTER — Telehealth: Payer: Self-pay

## 2024-11-10 NOTE — Telephone Encounter (Signed)
 Surgery has been delayed due to patient illness.  Now there are possible issues with her insurance.  In the meantime, she has a jury duty summons for next week and is wondering if Dr. Vernetta will write her a note to get her excused.

## 2024-11-21 ENCOUNTER — Other Ambulatory Visit: Payer: Self-pay | Admitting: Orthopaedic Surgery

## 2025-02-21 ENCOUNTER — Ambulatory Visit: Payer: PRIVATE HEALTH INSURANCE | Admitting: Internal Medicine
